# Patient Record
Sex: Male | Born: 1955 | Race: White | Hispanic: No | Smoking: Current every day smoker
Health system: Southern US, Community
[De-identification: ages and names within clinical notes are randomized; demographics above are authoritative.]

## PROBLEM LIST (undated history)

## (undated) DIAGNOSIS — J449 Chronic obstructive pulmonary disease, unspecified: Secondary | ICD-10-CM

## (undated) DIAGNOSIS — I2510 Atherosclerotic heart disease of native coronary artery without angina pectoris: Secondary | ICD-10-CM

## (undated) DIAGNOSIS — F1010 Alcohol abuse, uncomplicated: Secondary | ICD-10-CM

## (undated) DIAGNOSIS — Z720 Tobacco use: Secondary | ICD-10-CM

## (undated) HISTORY — PX: ABDOMINAL SURGERY: SHX537

---

## 2001-03-20 ENCOUNTER — Encounter: Payer: Self-pay | Admitting: Emergency Medicine

## 2001-03-20 ENCOUNTER — Emergency Department (HOSPITAL_COMMUNITY): Admission: EM | Admit: 2001-03-20 | Discharge: 2001-03-20 | Payer: Self-pay | Admitting: Emergency Medicine

## 2003-01-11 ENCOUNTER — Emergency Department (HOSPITAL_COMMUNITY): Admission: EM | Admit: 2003-01-11 | Discharge: 2003-01-11 | Payer: Self-pay | Admitting: Emergency Medicine

## 2003-12-23 ENCOUNTER — Emergency Department (HOSPITAL_COMMUNITY): Admission: EM | Admit: 2003-12-23 | Discharge: 2003-12-23 | Payer: Self-pay | Admitting: Emergency Medicine

## 2005-12-26 ENCOUNTER — Observation Stay (HOSPITAL_COMMUNITY): Admission: EM | Admit: 2005-12-26 | Discharge: 2005-12-27 | Payer: Self-pay | Admitting: Emergency Medicine

## 2006-01-10 ENCOUNTER — Ambulatory Visit: Payer: Self-pay | Admitting: *Deleted

## 2006-01-17 ENCOUNTER — Encounter (HOSPITAL_COMMUNITY): Admission: RE | Admit: 2006-01-17 | Discharge: 2006-02-16 | Payer: Self-pay | Admitting: *Deleted

## 2006-01-17 ENCOUNTER — Ambulatory Visit: Payer: Self-pay | Admitting: *Deleted

## 2006-01-24 ENCOUNTER — Ambulatory Visit: Payer: Self-pay | Admitting: *Deleted

## 2007-04-22 ENCOUNTER — Emergency Department (HOSPITAL_COMMUNITY): Admission: EM | Admit: 2007-04-22 | Discharge: 2007-04-22 | Payer: Self-pay | Admitting: Emergency Medicine

## 2007-04-24 ENCOUNTER — Emergency Department (HOSPITAL_COMMUNITY): Admission: EM | Admit: 2007-04-24 | Discharge: 2007-04-24 | Payer: Self-pay | Admitting: Emergency Medicine

## 2011-04-30 NOTE — Discharge Summary (Signed)
NAME:  Spencer Hickman, BETSCH             ACCOUNT NO.:  [id number]   MEDICAL RECORD NO.:  [id number]          PATIENT TYPE:  INP   LOCATION:  A223                          FACILITY:  [facility]   PHYSICIAN:  Margaretmary Dys, M.D.DATE OF BIRTH:  1956-01-07   DATE OF ADMISSION:  12/26/2005  DATE OF DISCHARGE:  01/15/2007LH                                 DISCHARGE SUMMARY   ADMITTING DIAGNOSES:  1.  Chest pain, rule out myocardial infarction.  2.  Acute alcohol intoxication.  3.  Pneumonia.  4.  Chronic obstructive pulmonary disease.   DISCHARGE MEDICATIONS:  1.  Levaquin 750 mg p.o. once a day for 5 days.  2.  Protonix 40 mg p.o. once a day.   DIET:  Low salt diet.   ACTIVITY:  As tolerated.   SPECIAL INSTRUCTIONS:  The patient was advised to quit smoking and alcohol.   HISTORY OF PRESENT ILLNESS:  Spencer Hickman is a 55 year old Caucasian male who  presented to the emergency room with complaints of chest pain mostly on the  left side of his chest.  This was described as sharp . Kindly review the  history and physical dictated by Dr. Rito Ehrlich on December 26, 2005.   The patient was admitted for chest pain, rule out MI.  Although his pain was  very atypical, it was felt that because of his family history of coronary  artery disease which was fairly significant and he has a smoking history,  close to 100-pack-year history of smoking, it would be reasonable to admit  him and rule him out.  After 12 hours of admission the patient stated that  his pain completely disappeared.  His chest x-ray on admission drew concern  for pneumonia which was treated with Levaquin.  It was felt that the pain  was very atypical and the patient may have outpatient stress test with  [facility].  This was scheduled.   ACTIVITY:  As tolerated.   PERTINENT LABORATORY DATA:  White blood cell count was 6.4, hemoglobin was  13.7, platelets were 177, alcohol level was 303.  BNP was 30, potassium was  3.3,  albumin was 3.2, calcium of 8.1.  Urine drug screen was positive for  benzodiazepines and THC.  Urinalysis was unremarkable.  Chest x-ray shows  focal atelectasis with bronchopneumonia in the right lung base.  EKG shows  sinus rhythm with possible left axis deviation.   DISPOSITION:  The patient is being discharged home.  Counseling was done on  quitting smoking.  Also I offered him options and treatment for quitting  smoking which he declined.  The patient said that he will do it on his own.  It was felt that his chest pain was very atypical; and we will, however,  recommend an outpatient stress test with [facility].  This will be  scheduled prior to his discharge.      Margaretmary Dys, M.D.  Electronically Signed     AM/MEDQ  D:  12/27/2005  T:  12/27/2005  Job:  [id number]

## 2011-04-30 NOTE — Procedures (Signed)
NAME:  Spencer Hickman, Spencer Hickman             ACCOUNT NO.:  [id number]   MEDICAL RECORD NO.:  [id number]          PATIENT TYPE:  REC   LOCATION:  DAY                           FACILITY:  [facility]   PHYSICIAN:  Vida Roller, M.D.   DATE OF BIRTH:  1956-02-23   DATE OF PROCEDURE:  DATE OF DISCHARGE:                                    STRESS TEST   HISTORY:  Spencer Hickman is a 55 year old male with no known coronary disease,  atypical chest discomfort, cardiac risk factors, tobacco abuse and family  history.   RESULTS OF PROCEDURE:  Electrocardiogram reveals sinus rhythm at 68 beats  per minute, nonspecific ST abnormalities.  Poor R-wave progression.  Blood  pressure is 138/72.   The patient exercised for a total of 12 minutes and 50 seconds, 14.8 mets,  maximal heart rate was 173 which is 102% of predicted, maximum blood  pressure is 198/88 and resolved down to 150/80 in recovery.  EKG revealed no  ischemic changes.  No arrhythmias were noted.   Final images and results are pending MD review.      Jae Dire, P.A. [facility]      Vida Roller, M.D.  Electronically Signed    AB/MEDQ  D:  01/17/2006  T:  01/17/2006  Job:  [id number]
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
## 2019-02-04 ENCOUNTER — Emergency Department (HOSPITAL_COMMUNITY): Payer: Medicare Other

## 2019-02-04 ENCOUNTER — Other Ambulatory Visit: Payer: Self-pay

## 2019-02-04 ENCOUNTER — Inpatient Hospital Stay (HOSPITAL_COMMUNITY)
Admission: EM | Admit: 2019-02-04 | Discharge: 2019-02-06 | DRG: 812 | Discharge status: Against Medical Advice | Payer: Medicare Other | Attending: Family Medicine | Admitting: Family Medicine

## 2019-02-04 ENCOUNTER — Encounter (HOSPITAL_COMMUNITY): Payer: Self-pay

## 2019-02-04 DIAGNOSIS — F10229 Alcohol dependence with intoxication, unspecified: Secondary | ICD-10-CM | POA: Diagnosis present

## 2019-02-04 DIAGNOSIS — Z681 Body mass index (BMI) 19 or less, adult: Secondary | ICD-10-CM

## 2019-02-04 DIAGNOSIS — R55 Syncope and collapse: Secondary | ICD-10-CM

## 2019-02-04 DIAGNOSIS — F17210 Nicotine dependence, cigarettes, uncomplicated: Secondary | ICD-10-CM | POA: Diagnosis present

## 2019-02-04 DIAGNOSIS — D509 Iron deficiency anemia, unspecified: Secondary | ICD-10-CM | POA: Diagnosis present

## 2019-02-04 DIAGNOSIS — R160 Hepatomegaly, not elsewhere classified: Secondary | ICD-10-CM

## 2019-02-04 DIAGNOSIS — D508 Other iron deficiency anemias: Principal | ICD-10-CM | POA: Diagnosis present

## 2019-02-04 DIAGNOSIS — E46 Unspecified protein-calorie malnutrition: Secondary | ICD-10-CM | POA: Diagnosis present

## 2019-02-04 DIAGNOSIS — F1010 Alcohol abuse, uncomplicated: Secondary | ICD-10-CM | POA: Diagnosis present

## 2019-02-04 DIAGNOSIS — R569 Unspecified convulsions: Secondary | ICD-10-CM

## 2019-02-04 DIAGNOSIS — I2510 Atherosclerotic heart disease of native coronary artery without angina pectoris: Secondary | ICD-10-CM | POA: Diagnosis present

## 2019-02-04 DIAGNOSIS — J441 Chronic obstructive pulmonary disease with (acute) exacerbation: Secondary | ICD-10-CM | POA: Diagnosis present

## 2019-02-04 DIAGNOSIS — J449 Chronic obstructive pulmonary disease, unspecified: Secondary | ICD-10-CM | POA: Diagnosis present

## 2019-02-04 DIAGNOSIS — Z9119 Patient's noncompliance with other medical treatment and regimen: Secondary | ICD-10-CM

## 2019-02-04 DIAGNOSIS — Z720 Tobacco use: Secondary | ICD-10-CM | POA: Diagnosis present

## 2019-02-04 DIAGNOSIS — F10129 Alcohol abuse with intoxication, unspecified: Secondary | ICD-10-CM | POA: Diagnosis present

## 2019-02-04 DIAGNOSIS — D649 Anemia, unspecified: Secondary | ICD-10-CM | POA: Diagnosis present

## 2019-02-04 DIAGNOSIS — R079 Chest pain, unspecified: Secondary | ICD-10-CM | POA: Diagnosis present

## 2019-02-04 DIAGNOSIS — Y908 Blood alcohol level of 240 mg/100 ml or more: Secondary | ICD-10-CM | POA: Diagnosis present

## 2019-02-04 DIAGNOSIS — I252 Old myocardial infarction: Secondary | ICD-10-CM

## 2019-02-04 DIAGNOSIS — E8809 Other disorders of plasma-protein metabolism, not elsewhere classified: Secondary | ICD-10-CM | POA: Diagnosis present

## 2019-02-04 DIAGNOSIS — R0609 Other forms of dyspnea: Secondary | ICD-10-CM

## 2019-02-04 HISTORY — DX: Chronic obstructive pulmonary disease, unspecified: J44.9

## 2019-02-04 HISTORY — DX: Tobacco use: Z72.0

## 2019-02-04 HISTORY — DX: Atherosclerotic heart disease of native coronary artery without angina pectoris: I25.10

## 2019-02-04 HISTORY — DX: Alcohol abuse, uncomplicated: F10.10

## 2019-02-04 MED ORDER — ALBUTEROL SULFATE (2.5 MG/3ML) 0.083% IN NEBU
5.0000 mg | INHALATION_SOLUTION | Freq: Once | RESPIRATORY_TRACT | Status: AC
  Administered 2019-02-04: 5 mg via RESPIRATORY_TRACT
  Filled 2019-02-04: qty 6

## 2019-02-04 NOTE — Progress Notes (Signed)
Patient took about half of treatment stated he couldn't breath . His oxygen drops very quickly, Keeps  asking for cigarette. Breath sounds decreased.

## 2019-02-04 NOTE — ED Triage Notes (Addendum)
Pt reports generalized chest pain x 3 months. The past couple of days pt has increased SOB with normal activity. Pt reports the past 1-2 months he has been having "syncopal episodes".  Pt reports hx of COPD but says he can't afford medication. Pt drinks about 6-12 beers daily per report and smokes 1- 1/2 pack of cigarettes daily.

## 2019-02-05 ENCOUNTER — Observation Stay (HOSPITAL_BASED_OUTPATIENT_CLINIC_OR_DEPARTMENT_OTHER): Payer: Medicare Other

## 2019-02-05 ENCOUNTER — Encounter (HOSPITAL_COMMUNITY): Payer: Self-pay | Admitting: Internal Medicine

## 2019-02-05 ENCOUNTER — Observation Stay (HOSPITAL_COMMUNITY): Payer: Medicare Other

## 2019-02-05 DIAGNOSIS — J449 Chronic obstructive pulmonary disease, unspecified: Secondary | ICD-10-CM | POA: Diagnosis present

## 2019-02-05 DIAGNOSIS — J441 Chronic obstructive pulmonary disease with (acute) exacerbation: Secondary | ICD-10-CM | POA: Diagnosis present

## 2019-02-05 DIAGNOSIS — R55 Syncope and collapse: Secondary | ICD-10-CM | POA: Diagnosis not present

## 2019-02-05 DIAGNOSIS — F10129 Alcohol abuse with intoxication, unspecified: Secondary | ICD-10-CM | POA: Diagnosis present

## 2019-02-05 DIAGNOSIS — F1010 Alcohol abuse, uncomplicated: Secondary | ICD-10-CM | POA: Diagnosis present

## 2019-02-05 DIAGNOSIS — E8809 Other disorders of plasma-protein metabolism, not elsewhere classified: Secondary | ICD-10-CM | POA: Diagnosis present

## 2019-02-05 DIAGNOSIS — D509 Iron deficiency anemia, unspecified: Secondary | ICD-10-CM | POA: Diagnosis present

## 2019-02-05 DIAGNOSIS — Z720 Tobacco use: Secondary | ICD-10-CM

## 2019-02-05 DIAGNOSIS — R079 Chest pain, unspecified: Secondary | ICD-10-CM | POA: Diagnosis present

## 2019-02-05 HISTORY — DX: Tobacco use: Z72.0

## 2019-02-05 LAB — CBC
HCT: 26 % — ABNORMAL LOW (ref 39.0–52.0)
Hemoglobin: 7.5 g/dL — ABNORMAL LOW (ref 13.0–17.0)
MCH: 23.7 pg — ABNORMAL LOW (ref 26.0–34.0)
MCHC: 28.8 g/dL — ABNORMAL LOW (ref 30.0–36.0)
MCV: 82.3 fL (ref 80.0–100.0)
PLATELETS: 313 10*3/uL (ref 150–400)
RBC: 3.16 MIL/uL — ABNORMAL LOW (ref 4.22–5.81)
RDW: 23.4 % — ABNORMAL HIGH (ref 11.5–15.5)
WBC: 3.9 10*3/uL — AB (ref 4.0–10.5)
nRBC: 0 % (ref 0.0–0.2)

## 2019-02-05 LAB — IRON AND TIBC
IRON: 7 ug/dL — AB (ref 45–182)
Saturation Ratios: 2 % — ABNORMAL LOW (ref 17.9–39.5)
TIBC: 329 ug/dL (ref 250–450)
UIBC: 322 ug/dL

## 2019-02-05 LAB — URINALYSIS, ROUTINE W REFLEX MICROSCOPIC
Bilirubin Urine: NEGATIVE
GLUCOSE, UA: NEGATIVE mg/dL
HGB URINE DIPSTICK: NEGATIVE
Ketones, ur: NEGATIVE mg/dL
Leukocytes,Ua: NEGATIVE
Nitrite: NEGATIVE
Protein, ur: NEGATIVE mg/dL
Specific Gravity, Urine: 1.005 (ref 1.005–1.030)
pH: 5 (ref 5.0–8.0)

## 2019-02-05 LAB — COMPREHENSIVE METABOLIC PANEL
ALK PHOS: 109 U/L (ref 38–126)
ALT: 25 U/L (ref 0–44)
ANION GAP: 14 (ref 5–15)
AST: 63 U/L — ABNORMAL HIGH (ref 15–41)
Albumin: 2.6 g/dL — ABNORMAL LOW (ref 3.5–5.0)
BUN: 8 mg/dL (ref 8–23)
CALCIUM: 8.1 mg/dL — AB (ref 8.9–10.3)
CO2: 20 mmol/L — ABNORMAL LOW (ref 22–32)
CREATININE: 0.73 mg/dL (ref 0.61–1.24)
Chloride: 101 mmol/L (ref 98–111)
Glucose, Bld: 99 mg/dL (ref 70–99)
Potassium: 3.9 mmol/L (ref 3.5–5.1)
Sodium: 135 mmol/L (ref 135–145)
Total Bilirubin: 0.2 mg/dL — ABNORMAL LOW (ref 0.3–1.2)
Total Protein: 7.4 g/dL (ref 6.5–8.1)

## 2019-02-05 LAB — CBC WITH DIFFERENTIAL/PLATELET
Abs Immature Granulocytes: 0.02 10*3/uL (ref 0.00–0.07)
BASOS PCT: 1 %
Basophils Absolute: 0.1 10*3/uL (ref 0.0–0.1)
EOS ABS: 0.1 10*3/uL (ref 0.0–0.5)
EOS PCT: 2 %
HCT: 27.1 % — ABNORMAL LOW (ref 39.0–52.0)
Hemoglobin: 7.8 g/dL — ABNORMAL LOW (ref 13.0–17.0)
Immature Granulocytes: 0 %
Lymphocytes Relative: 34 %
Lymphs Abs: 1.9 10*3/uL (ref 0.7–4.0)
MCH: 22.9 pg — ABNORMAL LOW (ref 26.0–34.0)
MCHC: 28.8 g/dL — AB (ref 30.0–36.0)
MCV: 79.5 fL — ABNORMAL LOW (ref 80.0–100.0)
Monocytes Absolute: 0.4 10*3/uL (ref 0.1–1.0)
Monocytes Relative: 7 %
NRBC: 0 % (ref 0.0–0.2)
Neutro Abs: 3.2 10*3/uL (ref 1.7–7.7)
Neutrophils Relative %: 56 %
PLATELETS: 332 10*3/uL (ref 150–400)
RBC: 3.41 MIL/uL — AB (ref 4.22–5.81)
RDW: 23.4 % — AB (ref 11.5–15.5)
WBC: 5.6 10*3/uL (ref 4.0–10.5)

## 2019-02-05 LAB — PHOSPHORUS: Phosphorus: 3.8 mg/dL (ref 2.5–4.6)

## 2019-02-05 LAB — PROTIME-INR
INR: 1.04
PROTHROMBIN TIME: 13.5 s (ref 11.4–15.2)

## 2019-02-05 LAB — CBG MONITORING, ED: Glucose-Capillary: 143 mg/dL — ABNORMAL HIGH (ref 70–99)

## 2019-02-05 LAB — ECHOCARDIOGRAM COMPLETE
Height: 71 in
Weight: 1908.3 oz

## 2019-02-05 LAB — OCCULT BLOOD, POC DEVICE: Fecal Occult Bld: NEGATIVE

## 2019-02-05 LAB — ETHANOL: ALCOHOL ETHYL (B): 332 mg/dL — AB (ref <10)

## 2019-02-05 LAB — TROPONIN I: Troponin I: <0.03 ng/mL (ref <0.03)

## 2019-02-05 LAB — RETICULOCYTES
Immature Retic Fract: 25.3 % — ABNORMAL HIGH (ref 2.3–15.9)
RBC.: 3.28 MIL/uL — ABNORMAL LOW (ref 4.22–5.81)
RETIC COUNT ABSOLUTE: 47.2 10*3/uL (ref 19.0–186.0)
RETIC CT PCT: 1.4 % (ref 0.4–3.1)

## 2019-02-05 LAB — MRSA PCR SCREENING: MRSA by PCR: NEGATIVE

## 2019-02-05 LAB — FERRITIN: FERRITIN: 9 ng/mL — AB (ref 24–336)

## 2019-02-05 LAB — FOLATE: Folate: 10.2 ng/mL (ref >5.9)

## 2019-02-05 LAB — VITAMIN B12: Vitamin B-12: 245 pg/mL (ref 180–914)

## 2019-02-05 LAB — MAGNESIUM: Magnesium: 2 mg/dL (ref 1.7–2.4)

## 2019-02-05 LAB — BRAIN NATRIURETIC PEPTIDE: B Natriuretic Peptide: 64 pg/mL (ref 0.0–100.0)

## 2019-02-05 MED ORDER — LORAZEPAM 2 MG/ML IJ SOLN: 2.0000 mg | INTRAMUSCULAR | Status: DC | PRN

## 2019-02-05 MED ORDER — IPRATROPIUM-ALBUTEROL 0.5-2.5 (3) MG/3ML IN SOLN
3.0000 mL | RESPIRATORY_TRACT | Status: DC | PRN
  Filled 2019-02-05: qty 3

## 2019-02-05 MED ORDER — KCL IN DEXTROSE-NACL 20-5-0.9 MEQ/L-%-% IV SOLN: INTRAVENOUS | Status: DC

## 2019-02-05 MED ORDER — GUAIFENESIN ER 600 MG PO TB12
600.0000 mg | ORAL_TABLET | Freq: Two times a day (BID) | ORAL | Status: DC
  Administered 2019-02-05 – 2019-02-06 (×3): 600 mg via ORAL
  Filled 2019-02-05 (×4): qty 1

## 2019-02-05 MED ORDER — METHYLPREDNISOLONE SODIUM SUCC 40 MG IJ SOLR
40.0000 mg | Freq: Once | INTRAMUSCULAR | Status: AC
  Administered 2019-02-05: 40 mg via INTRAVENOUS
  Filled 2019-02-05: qty 1

## 2019-02-05 MED ORDER — MAGNESIUM SULFATE 2 GM/50ML IV SOLN
2.0000 g | Freq: Once | INTRAVENOUS | Status: AC
  Administered 2019-02-05: 2 g via INTRAVENOUS
  Filled 2019-02-05: qty 50

## 2019-02-05 MED ORDER — SODIUM CHLORIDE 0.9 % IV BOLUS: 1000.0000 mL | Freq: Once | INTRAVENOUS | Status: DC

## 2019-02-05 MED ORDER — SODIUM CHLORIDE 0.9 % IV BOLUS
1000.0000 mL | Freq: Once | INTRAVENOUS | Status: AC
  Administered 2019-02-05: 1000 mL via INTRAVENOUS

## 2019-02-05 MED ORDER — DIAZEPAM 5 MG PO TABS
5.0000 mg | ORAL_TABLET | Freq: Once | ORAL | Status: AC
  Administered 2019-02-05: 5 mg via ORAL
  Filled 2019-02-05: qty 1

## 2019-02-05 MED ORDER — NICOTINE 21 MG/24HR TD PT24
21.0000 mg | MEDICATED_PATCH | Freq: Once | TRANSDERMAL | Status: AC
  Administered 2019-02-05: 21 mg via TRANSDERMAL
  Filled 2019-02-05: qty 1

## 2019-02-05 MED ORDER — METOPROLOL TARTRATE 25 MG PO TABS
25.0000 mg | ORAL_TABLET | Freq: Two times a day (BID) | ORAL | Status: DC
  Administered 2019-02-05 – 2019-02-06 (×3): 25 mg via ORAL
  Filled 2019-02-05 (×3): qty 1

## 2019-02-05 MED ORDER — NICOTINE 21 MG/24HR TD PT24
21.0000 mg | MEDICATED_PATCH | TRANSDERMAL | Status: DC
  Administered 2019-02-06: 21 mg via TRANSDERMAL
  Filled 2019-02-05: qty 1

## 2019-02-05 MED ORDER — DOXYCYCLINE HYCLATE 100 MG PO TABS
100.0000 mg | ORAL_TABLET | Freq: Two times a day (BID) | ORAL | Status: DC
  Administered 2019-02-05 – 2019-02-06 (×3): 100 mg via ORAL
  Filled 2019-02-05 (×3): qty 1

## 2019-02-05 MED ORDER — ONDANSETRON HCL 4 MG/2ML IJ SOLN: 4.0000 mg | Freq: Four times a day (QID) | INTRAMUSCULAR | Status: DC | PRN

## 2019-02-05 MED ORDER — PANTOPRAZOLE SODIUM 40 MG IV SOLR
40.0000 mg | INTRAVENOUS | Status: DC
  Administered 2019-02-05: 40 mg via INTRAVENOUS
  Filled 2019-02-05: qty 40

## 2019-02-05 MED ORDER — THIAMINE HCL 100 MG/ML IJ SOLN
INTRAVENOUS | Status: AC
  Filled 2019-02-05: qty 1000

## 2019-02-05 MED ORDER — NICOTINE 21 MG/24HR TD PT24: 21.0000 mg | MEDICATED_PATCH | TRANSDERMAL | Status: DC

## 2019-02-05 MED ORDER — IPRATROPIUM-ALBUTEROL 0.5-2.5 (3) MG/3ML IN SOLN
3.0000 mL | Freq: Three times a day (TID) | RESPIRATORY_TRACT | Status: DC
  Administered 2019-02-05 – 2019-02-06 (×2): 3 mL via RESPIRATORY_TRACT
  Filled 2019-02-05 (×2): qty 3

## 2019-02-05 MED ORDER — THIAMINE HCL 100 MG/ML IJ SOLN: Freq: Once | INTRAVENOUS | Status: DC

## 2019-02-05 MED ORDER — SODIUM CHLORIDE 0.9% FLUSH
3.0000 mL | Freq: Two times a day (BID) | INTRAVENOUS | Status: DC
  Administered 2019-02-05 – 2019-02-06 (×2): 3 mL via INTRAVENOUS

## 2019-02-05 MED ORDER — THIAMINE HCL 100 MG/ML IJ SOLN
100.0000 mg | Freq: Every day | INTRAMUSCULAR | Status: DC
  Administered 2019-02-05 – 2019-02-06 (×2): 100 mg via INTRAVENOUS
  Filled 2019-02-05 (×2): qty 2

## 2019-02-05 MED ORDER — PANTOPRAZOLE SODIUM 40 MG PO TBEC
40.0000 mg | DELAYED_RELEASE_TABLET | Freq: Every day | ORAL | Status: DC
  Filled 2019-02-05: qty 1

## 2019-02-05 MED ORDER — ATORVASTATIN CALCIUM 10 MG PO TABS
10.0000 mg | ORAL_TABLET | Freq: Every day | ORAL | Status: DC
  Administered 2019-02-05: 10 mg via ORAL
  Filled 2019-02-05: qty 1

## 2019-02-05 MED ORDER — PREDNISONE 20 MG PO TABS
40.0000 mg | ORAL_TABLET | Freq: Every day | ORAL | Status: DC
  Administered 2019-02-05 – 2019-02-06 (×2): 40 mg via ORAL
  Filled 2019-02-05 (×2): qty 2

## 2019-02-05 MED ORDER — THIAMINE HCL 100 MG/ML IJ SOLN
INTRAVENOUS | Status: AC
  Administered 2019-02-05: 16:00:00 via INTRAVENOUS
  Filled 2019-02-05: qty 1000

## 2019-02-05 NOTE — ED Notes (Signed)
hospitalist at bedside

## 2019-02-05 NOTE — ED Notes (Signed)
Spoke with patient and family about the need and importance of pt staying as he has some abnormal lab work. Offered pt a nicotine patch. Pt accepted. Pt and family agreed at this time. Dr Lynelle Doctor aware.

## 2019-02-05 NOTE — ED Notes (Signed)
Family at bedside. 
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
## 2019-02-05 NOTE — H&P (Signed)
History and Physical    Spencer Hickman:[id number] DOB: 06-18-1956 DOA: 02/04/2019  PCP: Patient, No Pcp Per   Patient coming from: Home.  I have personally briefly reviewed patient's old medical records in [place]  Chief Complaint: Syncopal episodes  HPI: Spencer Hickman is a 63 y.o. male with medical history significant of alcohol abuse, tobacco abuse, COPD, CAD, history of MI in 2007, noncompliance with medical treatment or follow-ups (has not seen cardiology since having MI) who is coming to the emergency department with multiple complaints, including feeling progressively worse dyspnea associated with nonproductive coughing and wheezing for the past 2 days, but states that for the past 2 to 3 months he has not felt well.  He has been lightheaded often.  He has passed out 2 or 3 times, most recently this past week, he has had several near syncopal episodes as well.  He complains of on and off chest pain while exerting, but denies nausea, emesis or diaphoresis.  He denies orthopnea or lower extremity edema.  He denies abdominal pain, nausea, emesis or hematemesis, diarrhea or constipation, melena or hematochezia.  He denies dysuria, frequency or hematuria.  He denies polyuria, polydipsia, polyphagia or blurred vision.  ED Course: Initial vital signs temperature 98.2 F, pulse 96, respirations 21, blood pressure 154/99 mmHg and O2 sat 84% on room air.  Patient received supplemental oxygen and a 5 mg albuterol continuous nebulizer treatment.  I ordered magnesium sulfate 2 g IVPB, Solu-Medrol 40 mg IVP and at 1000 mL NS bolus.  His white count was 5.6 with a normal differential, hemoglobin 7.8 g/dL with an MCV of 79.54 fL and platelets 332.  PT/INR within normal limits troponin level was normal.  EKG was sinus tachycardia with LAD, questionable LAFB.  There was baseline wandering electrode noise.  There were no previous EKGs to compare to. BNP was 64.0 pg/mL.  CMP shows CO2 of 20  mmol/L and calcium of 8.1 mg/dL.  All other electrolytes are normal.  Renal function within expected values.  Total protein was 7.4, but albumin was low at 2.6 g/dL.  AST was 63 units/L.  ALT and alk phos were normal.  Total bilirubin is low at 0.2 and alcohol is 332 mg/dL.  Imaging: CT head showed chronic atrophy and small vessel ischemic changes.  There were chronic inflammatory changes in the right maxillary antrum.  However, there were no acute intracranial abnormalities.  His chest radiograph shows emphysematous and chronic bronchitic changes in the lungs.  There is no evidence of active pulmonary disease though.  Please see images and full radiology report for further detail.  Review of Systems: As per HPI otherwise 10 point review of systems negative.   Past Medical History:  Diagnosis Date  . Alcohol abuse   . COPD (chronic obstructive pulmonary disease) ([place])   . Coronary artery disease    States "had a mild heart attack years ago.  . Tobacco abuse 02/05/2019   Family History  Problem Relation Age of Onset  . Cancer Mother   . Brain cancer Father   . Cancer Sister   . Cancer Brother    Past Surgical History:  Procedure Laterality Date  . ABDOMINAL SURGERY     from [place] asva child     reports that he has been smoking cigarettes. He has a 50.00 pack-year smoking history. He has never used smokeless tobacco. He reports current alcohol use. He reports current drug use. Drug: Marijuana.  No Known Allergies  Family History  Problem Relation Age of Onset  . Cancer Mother   . Brain cancer Father   . Cancer Sister   . Cancer Brother    Prior to Admission medications   Not on File    Physical Exam: Vitals:   02/05/19 0130 02/05/19 0200 02/05/19 0230 02/05/19 0300  BP: 110/66 112/72 107/72 126/74  Pulse: 92 (!) 108 94 (!) 105  Resp: 14 20 (!) 21 19  Temp:      TempSrc:      SpO2: 99% 100% 100% 98%  Weight:      Height:        Constitutional: Looks older than  chronological age.  NAD, calm, comfortable Eyes: PERRL, lids and conjunctivae look pale. ENMT: Mucous membranes are moist. Posterior pharynx clear of any exudate or lesions. Neck: normal, supple, no masses, no thyromegaly Respiratory: Mild bilateral rhonchi and wheezing, no crackles. Normal respiratory effort. No accessory muscle use.  Cardiovascular: Tachycardic at 106 bpm, no murmurs / rubs / gallops. No extremity edema. 2+ pedal pulses. No carotid bruits.  Abdomen: Soft, mild epigastric tenderness, no guarding, no masses palpated. No hepatosplenomegaly. Bowel sounds positive.  Musculoskeletal: no clubbing / cyanosis. Good ROM, no contractures. Normal muscle tone.  Skin: no rashes, lesions, ulcers on limited dermatological examination. Neurologic: CN 2-12 grossly intact. Sensation intact, DTR normal. Strength 5/5 in all 4.  Psychiatric: Normal judgment and insight. Alert and oriented x 3. Normal mood.   Labs on Admission: I have personally reviewed following labs and imaging studies  CBC: Recent Labs  Lab 02/04/19 2358  WBC 5.6  NEUTROABS 3.2  HGB 7.8*  HCT 27.1*  MCV 79.5*  PLT 762   Basic Metabolic Panel: Recent Labs  Lab 02/04/19 2358  NA 135  K 3.9  CL 101  CO2 20*  GLUCOSE 99  BUN 8  CREATININE 0.73  CALCIUM 8.1*   GFR: Estimated Creatinine Clearance: 85.7 mL/min (by C-G formula based on SCr of 0.73 mg/dL). Liver Function Tests: Recent Labs  Lab 02/04/19 2358  AST 63*  ALT 25  ALKPHOS 109  BILITOT 0.2*  PROT 7.4  ALBUMIN 2.6*   No results for input(s): LIPASE, AMYLASE in the last 168 hours. No results for input(s): AMMONIA in the last 168 hours. Coagulation Profile: No results for input(s): INR, PROTIME in the last 168 hours. Cardiac Enzymes: Recent Labs  Lab 02/04/19 2358  TROPONINI <0.03   BNP (last 3 results) No results for input(s): PROBNP in the last 8760 hours. HbA1C: No results for input(s): HGBA1C in the last 72 hours. CBG: No results  for input(s): GLUCAP in the last 168 hours. Lipid Profile: No results for input(s): CHOL, HDL, LDLCALC, TRIG, CHOLHDL, LDLDIRECT in the last 72 hours. Thyroid Function Tests: No results for input(s): TSH, T4TOTAL, FREET4, T3FREE, THYROIDAB in the last 72 hours. Anemia Panel: No results for input(s): VITAMINB12, FOLATE, FERRITIN, TIBC, IRON, RETICCTPCT in the last 72 hours. Urine analysis: No results found for: COLORURINE, APPEARANCEUR, LABSPEC, PHURINE, GLUCOSEU, HGBUR, BILIRUBINUR, KETONESUR, PROTEINUR, UROBILINOGEN, NITRITE, LEUKOCYTESUR  Radiological Exams on Admission: Dg Chest 2 View  Result Date: 02/05/2019 CLINICAL DATA:  Generalized chest pain for 3 months. Increasing shortness of breath with normal activity. Syncopal episodes. History of COPD. Current smoker. EXAM: CHEST - 2 VIEW COMPARISON:  07/19/2017 FINDINGS: Normal heart size and pulmonary vascularity. Emphysematous changes in the lungs. Scattered central fibrosis suggesting chronic bronchitis. No focal consolidation. No blunting of costophrenic angles. No pneumothorax. Mediastinal contours appear intact.  Degenerative changes in the spine and shoulders. IMPRESSION: Emphysematous and chronic bronchitic changes in the lungs. No evidence of active pulmonary disease. Electronically Signed   By: Lucienne Capers M.D.   On: 02/05/2019 00:28   Ct Head Wo Contrast  Result Date: 02/05/2019 CLINICAL DATA:  Generalized chest pain for 3 months. Increasing shortness of breath with normal activity. Syncopal episodes. EXAM: CT HEAD WITHOUT CONTRAST TECHNIQUE: Contiguous axial images were obtained from the base of the skull through the vertex without intravenous contrast. COMPARISON:  06/09/2008 FINDINGS: Brain: Diffuse cerebral atrophy. Mild ventricular dilatation consistent with central atrophy. Low-attenuation changes in the deep white matter consistent small vessel ischemia. No mass-effect or midline shift. No abnormal extra-axial fluid  collections. Gray-white matter junctions are distinct. Basal cisterns are not effaced. No acute intracranial hemorrhage. Vascular: Mild intracranial arterial vascular calcifications are present. Skull: Calvarium appears intact. No acute depressed skull fractures. Sinuses/Orbits: Mucosal thickening in the right maxillary antrum. Paranasal sinuses and mastoid air cells are otherwise clear. Other: None. IMPRESSION: No acute intracranial abnormalities. Chronic atrophy and small vessel ischemic changes. Chronic inflammatory changes in the right maxillary antrum. Electronically Signed   By: Lucienne Capers M.D.   On: 02/05/2019 00:26    EKG: Independently reviewed.  Vent. rate 100 BPM PR interval * ms QRS duration 112 ms QT/QTc 352/454 ms P-R-T axes 77 -61 72 Sinus tachycardia LAD, consider left anterior fascicular block Baseline wander Electrode noise No previous EKGs.  Assessment/Plan Principal Problem:   Syncopal episodes Has passed out about 3 times. Has had multiple near syncopal events as well. Most likely this is due to microcytic anemia. Continue IV fluids. Monitor H&H. Check echocardiogram. Check carotid Doppler.  Active Problems:   Chest pain Trend troponin level. Serial EKGs. Check echocardiogram. Smoking cessation advised. Should follow-up with cardiology as an outpatient. Should also establish with a PCP.    Alcohol abuse with intoxication ([place]) Continue IV hydration. Magnesium was supplemented. Thiamine, folic acid and MVI. No signs of intoxication at this time CIWA protocol preemptively ordered. Advised against alcohol consumption.    Tobacco abuse Nicotine replacement therapy ordered.    Microcytic anemia Check anemia profile. Check stool occult blood. Monitor H&H. The patient agrees to a transfusion if needed. Consult GI if H&H decreases or positive guaiac stool.    COPD with acute exacerbation ([place]) Smoking cessation advised. Continue supplemental  oxygen. Continue schedule and as needed bronchodilators.    Hypoalbuminemia Secondary to malnutrition due to alcoholism. Needs alcohol cessation. Consider nutritional services evaluation.    DVT prophylaxis: SCDs. Code Status: Full code. Family Communication: Disposition Plan: Observation for syncope/CP work-up, troponin and H&H monitoring. Consults called: Routine gastroenterology consult. Admission status: Observation/stepdown.   Reubin Milan MD [facility]  02/05/2019, 3:33 AM

## 2019-02-05 NOTE — Progress Notes (Signed)
Patient seen and evaluated, chart reviewed, please see EMR for updated orders. Please see full H&P dictated by admitting physician Dr Robb Matar for same date of service.    Brief Summary:-   63 y.o. male with medical history significant of alcohol abuse, tobacco abuse, COPD, CAD, history of MI in 2007, noncompliance with medical treatment or follow-ups (has not seen cardiology since having MI) who admitted on 02/05/2019 with dyspnea with exertion ,  coughing and wheezing.. Symptoms have persisted for couple months however over the last couple of days cough and wheezing is gotten worse.  Also reports episodes of dizziness and fainting----BAL 332 (patient drinks over 12 beers each day), patient has not gone more than a couple days without drinking etoh in more than 20 years. He has passed out 2 or 3 times, most recently this past week, he has had several near syncopal episodes as well.  He complains of on and off chest pain while exerting, but denies nausea, emesis or diaphoresis.     Plan 1)Recurrent episodes of Near Syncope and syncope and  chest pain-----suspect due to significant alcoholic intoxication compounded by anemia----echo and carotid Dopplers pending, EKG without ACS pattern,.  Serial troponin negative, CT head without acute findings... Patient not interested in stress test or coronary revascularization approach, he is notoriously noncompliant, had cardiac work-up in 2007 and has not been back for cardiovascular evaluation since then, please see negative nuclear stress test from 01/19/2006  2)Heavy Alcohol Abuse------ vitamins and lorazepam per CIWA protocol, not interested in alcohol rehab or detox  3)Heavy Tobacco Abuse----patient smokes more than 1-1/2 packs/day..... Not interested in smoking cessation, may use nicotine patch while in hospital  4)Iron deficiency Anemia--- etiology unclear, on my rectal exam patient has brown stool that is Hemoccult negative at bedside--- no recent endoluminal  evaluation, will benefit from EGD and colonoscopy as outpatient unless she has significant drop in H&H in which case may need inpatient evaluation, check serial H&H, globin currently around 7.5 with hematocrit of 26 please note that MCV is 82 with MCH of 23.7 most likely due to concomitant "Relative" B12/folate deficiency in the patient with very heavy EtOH use (  serum folate is 10.2 (WNL), B12 is 245 (low Normal), ferritin is 9 (very low), serum iron is 7 with iron saturation of 2 (both very Low)  5)Recurrent falls/generalized weakness and debility----await physical therapy evaluation, check orthostatic vitals  6) acute COPD exacerbation-----treat empirically with prednisone, mucolytics, bronchodilators and doxycycline,, no significant hypoxia at this time   Patient seen and evaluated, chart reviewed, please see EMR for updated orders. Please see full H&P dictated by admitting physician Dr Robb Matar for same date of service

## 2019-02-05 NOTE — ED Notes (Signed)
Got ok from nurse to give patient food.

## 2019-02-05 NOTE — Progress Notes (Signed)
*  PRELIMINARY RESULTS* Echocardiogram 2D Echocardiogram has been performed.  Stacey Drain 02/05/2019, 2:50 PM

## 2019-02-05 NOTE — ED Notes (Signed)
Pt stating he wants to go home. Dr Lynelle Doctor made aware.

## 2019-02-06 ENCOUNTER — Inpatient Hospital Stay (HOSPITAL_COMMUNITY): Payer: Medicare Other

## 2019-02-06 ENCOUNTER — Encounter (HOSPITAL_COMMUNITY): Payer: Self-pay | Admitting: Gastroenterology

## 2019-02-06 DIAGNOSIS — D649 Anemia, unspecified: Secondary | ICD-10-CM | POA: Diagnosis not present

## 2019-02-06 DIAGNOSIS — F17210 Nicotine dependence, cigarettes, uncomplicated: Secondary | ICD-10-CM

## 2019-02-06 DIAGNOSIS — F10129 Alcohol abuse with intoxication, unspecified: Secondary | ICD-10-CM | POA: Diagnosis not present

## 2019-02-06 DIAGNOSIS — R569 Unspecified convulsions: Secondary | ICD-10-CM | POA: Diagnosis present

## 2019-02-06 DIAGNOSIS — F10229 Alcohol dependence with intoxication, unspecified: Secondary | ICD-10-CM | POA: Diagnosis present

## 2019-02-06 DIAGNOSIS — R079 Chest pain, unspecified: Secondary | ICD-10-CM

## 2019-02-06 DIAGNOSIS — J441 Chronic obstructive pulmonary disease with (acute) exacerbation: Secondary | ICD-10-CM

## 2019-02-06 DIAGNOSIS — Z681 Body mass index (BMI) 19 or less, adult: Secondary | ICD-10-CM | POA: Diagnosis not present

## 2019-02-06 DIAGNOSIS — R55 Syncope and collapse: Secondary | ICD-10-CM | POA: Diagnosis not present

## 2019-02-06 DIAGNOSIS — I2510 Atherosclerotic heart disease of native coronary artery without angina pectoris: Secondary | ICD-10-CM | POA: Diagnosis present

## 2019-02-06 DIAGNOSIS — Z9119 Patient's noncompliance with other medical treatment and regimen: Secondary | ICD-10-CM | POA: Diagnosis not present

## 2019-02-06 DIAGNOSIS — D508 Other iron deficiency anemias: Secondary | ICD-10-CM | POA: Diagnosis present

## 2019-02-06 DIAGNOSIS — F1010 Alcohol abuse, uncomplicated: Secondary | ICD-10-CM | POA: Diagnosis present

## 2019-02-06 DIAGNOSIS — I252 Old myocardial infarction: Secondary | ICD-10-CM | POA: Diagnosis not present

## 2019-02-06 DIAGNOSIS — Y908 Blood alcohol level of 240 mg/100 ml or more: Secondary | ICD-10-CM | POA: Diagnosis present

## 2019-02-06 DIAGNOSIS — E46 Unspecified protein-calorie malnutrition: Secondary | ICD-10-CM | POA: Diagnosis present

## 2019-02-06 DIAGNOSIS — Z720 Tobacco use: Secondary | ICD-10-CM | POA: Diagnosis not present

## 2019-02-06 LAB — HIV ANTIBODY (ROUTINE TESTING W REFLEX): HIV Screen 4th Generation wRfx: NONREACTIVE

## 2019-02-06 LAB — GLUCOSE, CAPILLARY: GLUCOSE-CAPILLARY: 126 mg/dL — AB (ref 70–99)

## 2019-02-06 LAB — CBC
HCT: 22.2 % — ABNORMAL LOW (ref 39.0–52.0)
Hemoglobin: 6.4 g/dL — CL (ref 13.0–17.0)
MCH: 22.9 pg — ABNORMAL LOW (ref 26.0–34.0)
MCHC: 28.8 g/dL — ABNORMAL LOW (ref 30.0–36.0)
MCV: 79.6 fL — ABNORMAL LOW (ref 80.0–100.0)
Platelets: 263 10*3/uL (ref 150–400)
RBC: 2.79 MIL/uL — ABNORMAL LOW (ref 4.22–5.81)
RDW: 22.5 % — AB (ref 11.5–15.5)
WBC: 7.7 10*3/uL (ref 4.0–10.5)
nRBC: 0 % (ref 0.0–0.2)

## 2019-02-06 LAB — COMPREHENSIVE METABOLIC PANEL
ALT: 19 U/L (ref 0–44)
AST: 32 U/L (ref 15–41)
Albumin: 2.1 g/dL — ABNORMAL LOW (ref 3.5–5.0)
Alkaline Phosphatase: 81 U/L (ref 38–126)
Anion gap: 7 (ref 5–15)
BUN: 8 mg/dL (ref 8–23)
CO2: 25 mmol/L (ref 22–32)
Calcium: 8 mg/dL — ABNORMAL LOW (ref 8.9–10.3)
Chloride: 103 mmol/L (ref 98–111)
Creatinine, Ser: 0.6 mg/dL — ABNORMAL LOW (ref 0.61–1.24)
GFR calc Af Amer: >60 mL/min (ref >60)
GFR calc non Af Amer: >60 mL/min (ref >60)
Glucose, Bld: 133 mg/dL — ABNORMAL HIGH (ref 70–99)
POTASSIUM: 4 mmol/L (ref 3.5–5.1)
Sodium: 135 mmol/L (ref 135–145)
Total Bilirubin: 0.5 mg/dL (ref 0.3–1.2)
Total Protein: 5.8 g/dL — ABNORMAL LOW (ref 6.5–8.1)

## 2019-02-06 LAB — ABO/RH: ABO/RH(D): A POS

## 2019-02-06 LAB — PREPARE RBC (CROSSMATCH)

## 2019-02-06 MED ORDER — PANTOPRAZOLE SODIUM 40 MG PO TBEC
40.0000 mg | DELAYED_RELEASE_TABLET | Freq: Two times a day (BID) | ORAL | Status: DC
  Administered 2019-02-06: 40 mg via ORAL

## 2019-02-06 MED ORDER — SODIUM CHLORIDE 0.9% IV SOLUTION
Freq: Once | INTRAVENOUS | Status: AC
  Administered 2019-02-06: 06:00:00 via INTRAVENOUS

## 2019-02-06 MED ORDER — FUROSEMIDE 10 MG/ML IJ SOLN
40.0000 mg | Freq: Once | INTRAMUSCULAR | Status: AC
  Administered 2019-02-06: 40 mg via INTRAVENOUS
  Filled 2019-02-06: qty 4

## 2019-02-06 MED ORDER — DIAZEPAM 5 MG PO TABS
5.0000 mg | ORAL_TABLET | Freq: Once | ORAL | Status: AC
  Administered 2019-02-06: 5 mg via ORAL
  Filled 2019-02-06: qty 1

## 2019-02-06 MED ORDER — VITAMIN B-1 100 MG PO TABS: 100.0000 mg | ORAL_TABLET | Freq: Every day | ORAL | Status: DC

## 2019-02-06 MED ORDER — MIRTAZAPINE 15 MG PO TABS: 15.0000 mg | ORAL_TABLET | Freq: Every day | ORAL | Status: DC

## 2019-02-06 MED ORDER — FOLIC ACID 1 MG PO TABS
1.0000 mg | ORAL_TABLET | Freq: Every day | ORAL | Status: DC
  Administered 2019-02-06: 1 mg via ORAL
  Filled 2019-02-06: qty 1

## 2019-02-06 MED ORDER — ENSURE ENLIVE PO LIQD: 237.0000 mL | Freq: Three times a day (TID) | ORAL | Status: DC

## 2019-02-06 MED ORDER — DIAZEPAM 5 MG PO TABS: 5.0000 mg | ORAL_TABLET | Freq: Once | ORAL | Status: DC

## 2019-02-06 NOTE — Progress Notes (Signed)
Patient stated he wanted to leave the hospital. He states he has been here  "3 days and they have done all they need to do" Informed MD who came into patient's room and discussed the risks of leaving against medical advice. Pt still indicated that he wishes to leave because "it's his birthday [birth year] and he wants to celebrate" I again reiterated the risks of leaving AMA patient insists on leaving. AMA documentation has been signed and in chart. IV catheter removed and intact.

## 2019-02-06 NOTE — Consult Note (Signed)
Referring Provider: Dr. Shon Hale Primary Care Physician:  Patient, No Pcp Per Primary Gastroenterologist:  Dr. Darrick Penna (previously unassigned)  Date of Admission: 02/04/19 Date of Consultation: 02/06/19  Reason for Consultation:  IDA  HPI:  Spencer Hickman is a 63 y.o. year old male presenting with dyspnea, near syncopal episodes, fatigue, intermittent chest pain, found to have Hgb 7.8, ethanol level markedly elevated at 332, heme negative. Hgb down to 6.4 this morning and currently receiving 1 unit PRBCs. GI consulted due to IDA.  Patient states he has felt fatigued and weak for past 2 months. Notes close to "blacking out" on multiple occasions. States his friend found him on the floor and brought to ED. Doesn't remember coming to the ED. Noted dyspnea on exertion and vague chest pain. He denies overt GI bleeding. No changes in bowel habits. Appetite is poor. "Beer fills me up". 6-12 beers per day for close to 30 years. No known history of liver disease. Takes Aleve for joint pain. No recent care with PCP. Family member at bedside states he has not had an evaluation for many years. Last colonoscopy and EGD done at least 15 years ago per patient, but he is unsure the location. Overall limited historian. Notes he has lost weight over past few months but unable to quantify amount.   He does not want to pursue endoscopic evaluation while inpatient. 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 Willing to pursue as outpatient.   Past Medical History:  Diagnosis Date  . Alcohol abuse   . COPD (chronic obstructive pulmonary disease) (HCC)   . Coronary artery disease    States "had a mild heart attack years ago.  . Tobacco abuse 02/05/2019    Past Surgical History:  Procedure Laterality Date  . ABDOMINAL SURGERY     from [facility] asva child    Prior to Admission medications   Not on File    Current Facility-Administered Medications  Medication Dose Route Frequency Provider Last Rate  Last Dose  . atorvastatin (LIPITOR) tablet 10 mg  10 mg Oral q1800 Emokpae, Courage, MD   10 mg at 02/05/19 1818  . diazepam (VALIUM) tablet 5 mg  5 mg Oral Once Emokpae, Courage, MD      . diazepam (VALIUM) tablet 5 mg  5 mg Oral Once Emokpae, Courage, MD      . doxycycline (VIBRA-TABS) tablet 100 mg  100 mg Oral BID Mariea Clonts, Courage, MD   100 mg at 02/06/19 0904  . feeding supplement (ENSURE ENLIVE) (ENSURE ENLIVE) liquid 237 mL  237 mL Oral TID BM Emokpae, Courage, MD      . folic acid (FOLVITE) tablet 1 mg  1 mg Oral Daily Emokpae, Courage, MD      . guaiFENesin (MUCINEX) 12 hr tablet 600 mg  600 mg Oral BID Mariea Clonts, Courage, MD   600 mg at 02/06/19 0905  . ipratropium-albuterol (DUONEB) 0.5-2.5 (3) MG/3ML nebulizer solution 3 mL  3 mL Nebulization TID Shon Hale, MD   3 mL at 02/06/19 0846  . LORazepam (ATIVAN) injection 2-3 mg  2-3 mg Intravenous Q1H PRN Bobette Mo, MD      . metoprolol tartrate (LOPRESSOR) tablet 25 mg  25 mg Oral BID Shon Hale, MD   25 mg at 02/06/19 0904  . mirtazapine (REMERON) tablet 15 mg  15 mg Oral QHS Emokpae, Courage, MD      . nicotine (NICODERM CQ - dosed in mg/24 hours) patch 21 mg  21 mg Transdermal Q24H  Shon Hale, MD   21 mg at 02/06/19 0905  . ondansetron (ZOFRAN) injection 4 mg  4 mg Intravenous Q6H PRN Bobette Mo, MD      . pantoprazole (PROTONIX) EC tablet 40 mg  40 mg Oral BID Shon Hale, MD   40 mg at 02/06/19 0904  . predniSONE (DELTASONE) tablet 40 mg  40 mg Oral Q breakfast Mariea Clonts, Courage, MD   40 mg at 02/06/19 0904  . sodium chloride flush (NS) 0.9 % injection 3 mL  3 mL Intravenous Q12H Bobette Mo, MD   3 mL at 02/06/19 0905  . thiamine (B-1) injection 100 mg  100 mg Intravenous Daily Bobette Mo, MD   100 mg at 02/06/19 0569  . thiamine (VITAMIN B-1) tablet 100 mg  100 mg Oral Daily Shon Hale, MD        Allergies as of 02/04/2019  . (No Known Allergies)    Family History   Problem Relation Age of Onset  . Cancer Mother   . Stomach cancer Mother   . Brain cancer Father   . Cancer Sister   . Cancer Brother   . Colon cancer Neg Hx   . Colon polyps Neg Hx     Social History   Socioeconomic History  . Marital status: Legally Separated    Spouse name: Not on file  . Number of children: Not on file  . Years of education: Not on file  . Highest education level: Not on file  Occupational History  . Not on file  Social Needs  . Financial resource strain: Not on file  . Food insecurity:    Worry: Not on file    Inability: Not on file  . Transportation needs:    Medical: Not on file    Non-medical: Not on file  Tobacco Use  . Smoking status: Current Every Day Smoker    Packs/day: 2.00    Years: 25.00    Pack years: 50.00    Types: Cigarettes  . Smokeless tobacco: Never Used  Substance and Sexual Activity  . Alcohol use: Yes    Comment: 6-12 beers daily  . Drug use: Yes    Types: Marijuana  . Sexual activity: Not on file  Lifestyle  . Physical activity:    Days per week: Not on file    Minutes per session: Not on file  . Stress: Not on file  Relationships  . Social connections:    Talks on phone: Not on file    Gets together: Not on file    Attends religious service: Not on file    Active member of club or organization: Not on file    Attends meetings of clubs or organizations: Not on file    Relationship status: Not on file  . Intimate partner violence:    Fear of current or ex partner: Not on file    Emotionally abused: Not on file    Physically abused: Not on file    Forced sexual activity: Not on file  Other Topics Concern  . Not on file  Social History Narrative  . Not on file    Review of Systems: Gen: see HPI CV: see HPI Resp: see HPI GI: see HPI GU : Denies urinary burning, urinary frequency, urinary incontinence.  MS: see HPI Derm: Denies rash, itching, dry skin Psych: Denies depression, anxiety,confusion, or  memory loss Heme: Denies bruising, bleeding, and enlarged lymph nodes.  Physical Exam: Vital signs in last  24 hours: Temp:  [97.7 F (36.5 C)-99.7 F (37.6 C)] 97.7 F (36.5 C) (02/25 0905) Pulse Rate:  [57-110] 66 (02/25 1000) Resp:  [11-22] 13 (02/25 1000) BP: (84-130)/(56-87) 126/71 (02/25 1000) SpO2:  [94 %-100 %] 100 % (02/25 1000) Weight:  [59 kg] 59 kg (02/25 0500) Last BM Date: 02/05/19 General:   Alert,  Thin but not cachectic, chronically ill-appearing Head:  Normocephalic and atraumatic. Eyes:  Sclera clear, no icterus.   Ears:  Normal auditory acuity. Nose:  No deformity, discharge,  or lesions. Mouth:  No deformity or lesions, dentition normal. Lungs:  Scattered rhonchi, mild wheeze Heart:  S1 S2 present; no murmurs, clicks, rubs,  or gallops. Abdomen:  Soft, nontender and nondistended. No masses. Query hepatomegaly on exam, possible liver margin midline abdomen Rectal:  Deferred until time of colonoscopy.   Msk:  Symmetrical without gross deformities. Normal posture. Extremities:  Without edema. Neurologic:  Alert and  oriented x4 Psych:  Alert and cooperative. Normal mood and affect.  Intake/Output from previous day: 02/24 0701 - 02/25 0700 In: 1402.7 [P.O.:480; I.V.:922.7] Out: 1000 [Urine:1000] Intake/Output this shift: Total I/O In: 833.3 [Blood:833.3] Out: 925 [Urine:925]  Lab Results: Recent Labs    02/04/19 2358 02/05/19 1021 02/06/19 0427  WBC 5.6 3.9* 7.7  HGB 7.8* 7.5* 6.4*  HCT 27.1* 26.0* 22.2*  PLT 332 313 263   BMET Recent Labs    02/04/19 2358 02/06/19 0427  NA 135 135  K 3.9 4.0  CL 101 103  CO2 20* 25  GLUCOSE 99 133*  BUN 8 8  CREATININE 0.73 0.60*  CALCIUM 8.1* 8.0*   LFT Recent Labs    02/04/19 2358 02/06/19 0427  PROT 7.4 5.8*  ALBUMIN 2.6* 2.1*  AST 63* 32  ALT 25 19  ALKPHOS 109 81  BILITOT 0.2* 0.5   PT/INR Recent Labs    02/05/19 0340  LABPROT 13.5  INR 1.04   Lab Results  Component Value Date    IRON 7 (L) 02/05/2019   TIBC 329 02/05/2019   FERRITIN 9 (L) 02/05/2019   Lab Results  Component Value Date   VITAMINB12 245 02/05/2019   Lab Results  Component Value Date   FOLATE 10.2 02/05/2019     Studies/Results: Dg Chest 2 View  Result Date: 02/05/2019 CLINICAL DATA:  Generalized chest pain for 3 months. Increasing shortness of breath with normal activity. Syncopal episodes. History of COPD. Current smoker. EXAM: CHEST - 2 VIEW COMPARISON:  07/19/2017 FINDINGS: Normal heart size and pulmonary vascularity. Emphysematous changes in the lungs. Scattered central fibrosis suggesting chronic bronchitis. No focal consolidation. No blunting of costophrenic angles. No pneumothorax. Mediastinal contours appear intact. Degenerative changes in the spine and shoulders. IMPRESSION: Emphysematous and chronic bronchitic changes in the lungs. No evidence of active pulmonary disease. Electronically Signed   By: Burman Nieves M.D.   On: 02/05/2019 00:28   Ct Head Wo Contrast  Result Date: 02/05/2019 CLINICAL DATA:  Generalized chest pain for 3 months. Increasing shortness of breath with normal activity. Syncopal episodes. EXAM: CT HEAD WITHOUT CONTRAST TECHNIQUE: Contiguous axial images were obtained from the base of the skull through the vertex without intravenous contrast. COMPARISON:  06/09/2008 FINDINGS: Brain: Diffuse cerebral atrophy. Mild ventricular dilatation consistent with central atrophy. Low-attenuation changes in the deep white matter consistent small vessel ischemia. No mass-effect or midline shift. No abnormal extra-axial fluid collections. Gray-white matter junctions are distinct. Basal cisterns are not effaced. No acute intracranial hemorrhage. Vascular: Mild intracranial arterial  vascular calcifications are present. Skull: Calvarium appears intact. No acute depressed skull fractures. Sinuses/Orbits: Mucosal thickening in the right maxillary antrum. Paranasal sinuses and mastoid air  cells are otherwise clear. Other: None. IMPRESSION: No acute intracranial abnormalities. Chronic atrophy and small vessel ischemic changes. Chronic inflammatory changes in the right maxillary antrum. Electronically Signed   By: Burman Nieves M.D.   On: 02/05/2019 00:26   US Carotid Bilateral  Result Date: 02/05/2019 CLINICAL DATA:  63 year old male with syncope EXAM: BILATERAL CAROTID DUPLEX ULTRASOUND TECHNIQUE: Wallace Cullens scale imaging, color Doppler and duplex ultrasound were performed of bilateral carotid and vertebral arteries in the neck. COMPARISON:  None. FINDINGS: Criteria: Quantification of carotid stenosis is based on velocity parameters that correlate the residual internal carotid diameter with NASCET-based stenosis levels, using the diameter of the distal internal carotid lumen as the denominator for stenosis measurement. The following velocity measurements were obtained: RIGHT ICA: 93/22 cm/sec CCA: 93/22 cm/sec SYSTOLIC ICA/CCA RATIO:  1.0 ECA:  115 cm/sec LEFT ICA: 104/35 cm/sec CCA: 98/31 cm/sec SYSTOLIC ICA/CCA RATIO:  1.2 ECA:  119 cm/sec RIGHT CAROTID ARTERY: Minimal smooth atherosclerotic plaque in the proximal internal carotid artery. By peak systolic velocity criteria, the estimated stenosis remains less than 50%. RIGHT VERTEBRAL ARTERY:  Patent with normal antegrade flow. LEFT CAROTID ARTERY: Mild partially calcified atherosclerotic plaque in the proximal internal carotid artery. By peak systolic velocity criteria, the estimated stenosis remains less than 50%. LEFT VERTEBRAL ARTERY:  Patent with normal antegrade flow. IMPRESSION: 1. Mild (1-49%) stenosis proximal right internal carotid artery secondary to minimal smooth heterogeneous atherosclerotic plaque. 2. Mild (1-49%) stenosis proximal left internal carotid artery secondary to mild partially calcified heterogeneous atherosclerotic plaque. 3. The vertebral arteries are patent with normal antegrade flow. Signed, Sterling Big, MD,  RPVI [facility] Electronically Signed   By: Malachy Moan M.D.   On: 02/05/2019 15:57    Impression: 63 year old male found to have anemia without overt GI bleeding, heme negative stool, unknown baseline Hgb. 1 of 2 units PRBCs completed this morning. Anemia multifactorial in setting of chronic disease, alcohol use, and notable IDA. Last endoscopic evaluations per patient were at least 15 years ago (colonoscopy/EGD), but he is unsure where this was completed. He does not want to pursue endoscopic evaluation while inpatient. No known liver disease, but he does have almost a 30 year history of 6-12 beers per day. Query hepatomegaly on exam.   As he is unwilling to pursue endoscopic procedures while inpatient, may start soft diet. Discussed need for diagnostic colonoscopy/EGD in setting of IDA, weight loss. He is aware this is recommended but still desires to go home. Family member at bedside for discussion as well.   Plan: Start soft diet PPI BID US abdomen complete if patient is willing Needs colonoscopy/EGD due to IDA but desires as outpatient Monitor for overt GI bleeding Discussed ETOH cessation with patient Compliance remains a concern in this setting.   Gelene Mink, PhD, ANP-BC [facility]      LOS: 0 days    02/06/2019, 10:40 AM

## 2019-02-06 NOTE — Clinical Social Work Note (Signed)
Clinical Social Work Assessment  Patient Details  Name: Spencer Hickman MRN: [id number] Date of Birth: 1956-09-08  Date of referral:  02/06/19               Reason for consult:  Substance Use/ETOH Abuse                Permission sought to share information with:    Permission granted to share information::     Name::        Agency::     Relationship::     Contact Information:     Housing/Transportation Living arrangements for the past 2 months:  Single Family Home Source of Information:  Patient Patient Interpreter Needed:  None Criminal Activity/Legal Involvement Pertinent to Current Situation/Hospitalization:  No - Comment as needed Significant Relationships:  Other Family Members Lives with:  Self Do you feel safe going back to the place where you live?  Yes Need for family participation in patient care:  No (Coment)  Care giving concerns: Pt lives independently.   Social Worker assessment / plan: Pt is a 63 year old male referred to [place] for AODA related issues. Met with pt at bedside today to assess. Pt is very pleasant and is alert and oriented x4. He states that he lives alone but that he has a distant relative and his family that live in his camper on his property. Pt does not use any DME at home. He states that the family members assist him with shopping and taking him to appointments. Discussed pt's ETOH use with him. Pt states he doesn't feel like he drinks that much stating that some days he will have two beers, other days four, and some days none. He states that he feels like the hospital staff is making it seem like he drinks more than he does. Pt reports that he has been drinking from a very young age and that he doesn't feel like he needs/wants to stop. AODA treatment resources were offered though pt politely declined as he stated that he does not desire treatment at this time. He is aware that LCSW will be available if he changes his mind before dc.   Employment  status:  Retired Forensic scientist:  Medicare PT Recommendations:  No Follow Up Information / Referral to community resources:     Patient/Family's Response to care: Pt accepting of care.  Patient/Family's Understanding of and Emotional Response to Diagnosis, Current Treatment, and Prognosis: Pt appears to have understanding of diagnosis and treatment recommendations. No emotional distress identified.  Emotional Assessment Appearance:  Appears stated age Attitude/Demeanor/Rapport:  Engaged Affect (typically observed):  Pleasant, Calm Orientation:  Oriented to Self, Oriented to Place, Oriented to  Time, Oriented to Situation Alcohol / Substance use:  Alcohol Use Psych involvement (Current and /or in the community):  No (Comment)  Discharge Needs  Concerns to be addressed:  Patient refuses services Readmission within the last 30 days:  No Current discharge risk:  Substance Abuse Barriers to Discharge:  Active Substance Use   Shade Flood, LCSW 02/06/2019, 12:39 PM

## 2019-02-06 NOTE — Progress Notes (Signed)
Spencer Hickman Demographics:    Spencer Hickman, is a 63 y.o. male, DOB - [birth year], ZOX:[id number]  Admit date - 02/04/2019   Admitting Physician Bobette Mo, MD  Outpatient Primary MD for the Spencer Hickman is Spencer Hickman, No Pcp Per  LOS - 0   Chief Complaint  Spencer Hickman presents with  . Shortness of Breath        Subjective:    [name] today has no fevers, no emesis,   wants to eat, declines GI consult for possible endoluminal evaluation,   Assessment  & Plan :    Principal Problem:   Syncopal episodes Active Problems:   Microcytic anemia   COPD with acute exacerbation (HCC)   Alcohol abuse with intoxication (HCC)   Tobacco abuse   Hypoalbuminemia   Chest pain  Brief Summary:-  63 y.o.malewith medical history significant ofalcohol abuse, tobacco abuse, COPD, CAD, history of MI in 2007, noncompliance with medical treatment or follow-ups (has not seen cardiology since having MI) who admitted on 02/05/2019 with dyspnea with exertion ,  coughing and wheezing.. Symptoms have persisted for couple months however over the last couple of days cough and wheezing is gotten worse.  Also reports episodes of dizziness and fainting----BAL 332 (Spencer Hickman drinks over 12 beers each day), Spencer Hickman has not gone more than a couple days without drinking etoh in more than 20 years.He has passed out 2 or 3 times, most recently this past week, he has had several near syncopal episodes as well. He complains of on and off chest pain while exerting, but denies nausea, emesis or diaphoresis.   Hemoglobin down to 6.4, Hemoccult negative    Plan 1)Recurrent episodes of Near Syncope and syncope and  chest pain-----suspect due to significant alcoholic intoxication compounded by symptomatic anemia with orthostasis----echo this admission with preserved EF of 60 to 65%, grade 1 diastolic dysfunction, no regional wall motion  normalities, valvular abnormalities specifically no significant aortic stenosis, and carotid Dopplers without hemodynamically significant stenosis, EKG without ACS pattern,.  Serial troponin negative, CT head without acute findings...  On admission orthostatic vitals remarkable for drop in BP and elevation heart rate consistent with symptomatic orthostatic dizziness with positional change . Spencer Hickman not interested in stress test or coronary revascularization approach, he is notoriously noncompliant, had cardiac work-up in 2007 and has not been back for cardiovascular evaluation since then, please see negative nuclear stress test from 01/19/2006  2)Heavy Alcohol Abuse------ vitamins and lorazepam per CIWA protocol, not interested in alcohol rehab or detox , no evidence of DTs at this time  3)Heavy Tobacco Abuse----Spencer Hickman smokes more than 1-1/2 packs/day..... Not interested in smoking cessation, may use nicotine patch while in hospital  4)Iron deficiency Anemia---  suspect acute on chronic , exact etiology of his chronic anemia is unknown,  hemoglobin is down to 6.4,  on my rectal exam Spencer Hickman has brown stool that is Hemoccult negative at bedside--- no recent endoluminal evaluation,  Spencer Hickman declines EGD and colonoscopy during this hospitalization, please note that MCV is 82 with MCH of 23.7 most likely due to concomitant "Relative" B12/folate deficiency in the Spencer Hickman with very heavy EtOH use (  serum folate is 10.2 (WNL), B12 is 245 (low Normal), ferritin is 9 (very low), serum iron is 7 with  iron saturation of 2 (both very Low)-------- awaiting official GI consult, no evidence of ongoing GI bleed at this time... Risk, benefits and alternatives to transfusion of blood products discussed. Indication for transfusion discussed. Consent obtained Please Transfuse 2 units of packed red blood cells, please give each unit of PRBC over 3 hours, please give Lasix 40 mg IV x1 after first unit of packed cells is  infused   5)Recurrent falls/generalized weakness and debility---suspect due to orthostatic dizziness in the setting of symptomatic anemia and chronic alcohol intoxication-await physical therapy evaluation,orthostatic vitals positive on admission  6) acute COPD exacerbation-----cough and congestion noted, okay to continue prednisone, mucolytics, bronchodilators and doxycycline,, no significant hypoxia at this time  7)FTT/failure to thrive and protein caloric malnutrition-----due to heavy alcohol abuse with reduced oral intake, give Remeron for appetite stimulation, encourage supplement intake  Disposition/Need for in-Hospital Stay- Spencer Hickman unable to be discharged at this time due to symptomatic anemia with orthostatic symptoms and with falls/syncope requiring transfusion of 2 units of packed cells  Code Status : Full   Family Communication:   Brother    Disposition Plan  : He wants to go home and drink and smoke  Consults  :  Gi  DVT Prophylaxis  :  - SCDs   Lab Results  Component Value Date   PLT 263 02/06/2019    Inpatient Medications  Scheduled Meds: . atorvastatin  10 mg Oral q1800  . doxycycline  100 mg Oral BID  . guaiFENesin  600 mg Oral BID  . ipratropium-albuterol  3 mL Nebulization TID  . metoprolol tartrate  25 mg Oral BID  . nicotine  21 mg Transdermal Q24H  . pantoprazole  40 mg Oral BID  . predniSONE  40 mg Oral Q breakfast  . sodium chloride flush  3 mL Intravenous Q12H  . thiamine injection  100 mg Intravenous Daily   Continuous Infusions: PRN Meds:.LORazepam, ondansetron (ZOFRAN) IV    Anti-infectives (From admission, onward)   Start     Dose/Rate Route Frequency Ordered Stop   02/05/19 1530  doxycycline (VIBRA-TABS) tablet 100 mg     100 mg Oral 2 times daily 02/05/19 1456          Objective:   Vitals:   02/06/19 0600 02/06/19 0637 02/06/19 0700 02/06/19 0747  BP: 120/73 104/61 126/78   Pulse: (!) 59 60 69 (!) 58  Resp: 13 12 15 12   Temp:  98.1 F (36.7 C) 97.7 F (36.5 C)  99.5 F (37.5 C)  TempSrc: Oral Oral  Oral  SpO2: 100% 100% 100% 100%  Weight:      Height:        Wt Readings from Last 3 Encounters:  02/06/19 59 kg     Intake/Output Summary (Last 24 hours) at 02/06/2019 1005 Last data filed at 02/06/2019 0500 Gross per 24 hour  Intake 1399.71 ml  Output 350 ml  Net 1049.71 ml     Physical Exam Spencer Hickman is examined daily including today on 02/06/19 , exams remain the same as of yesterday except that has changed   Gen:- Awake Alert, cachectic appearing HEENT:- Susan Moore.AT, No sclera icterus Neck-Supple Neck,No JVD,.  Lungs-diminished with a few scattered wheezes CV- S1, S2 normal, regular  Abd-  +ve B.Sounds, Abd Soft, No tenderness,    Extremity/Skin:- No  edema, pedal pulses present  Psych-affect is appropriate, oriented x3 Neuro-no new focal deficits, no tremors   Data Review:   Micro Results Recent Results (from the past 240  hour(s))  MRSA PCR Screening     Status: None   Collection Time: 02/05/19  8:44 AM  Result Value Ref Range Status   MRSA by PCR NEGATIVE NEGATIVE Final    Comment:        The GeneXpert MRSA Assay (FDA approved for NASAL specimens only), is one component of a comprehensive MRSA colonization surveillance program. It is not intended to diagnose MRSA infection nor to guide or monitor treatment for MRSA infections. Performed at [facility], [address] [place], [place] [address]     Radiology Reports Dg Chest 2 View  Result Date: 02/05/2019 CLINICAL DATA:  Generalized chest pain for 3 months. Increasing shortness of breath with normal activity. Syncopal episodes. History of COPD. Current smoker. EXAM: CHEST - 2 VIEW COMPARISON:  07/19/2017 FINDINGS: Normal heart size and pulmonary vascularity. Emphysematous changes in the lungs. Scattered central fibrosis suggesting chronic bronchitis. No focal consolidation. No blunting of costophrenic angles. No pneumothorax.  Mediastinal contours appear intact. Degenerative changes in the spine and shoulders. IMPRESSION: Emphysematous and chronic bronchitic changes in the lungs. No evidence of active pulmonary disease. Electronically Signed   By: Burman Nieves M.D.   On: 02/05/2019 00:28   Ct Head Wo Contrast  Result Date: 02/05/2019 CLINICAL DATA:  Generalized chest pain for 3 months. Increasing shortness of breath with normal activity. Syncopal episodes. EXAM: CT HEAD WITHOUT CONTRAST TECHNIQUE: Contiguous axial images were obtained from the base of the skull through the vertex without intravenous contrast. COMPARISON:  06/09/2008 FINDINGS: Brain: Diffuse cerebral atrophy. Mild ventricular dilatation consistent with central atrophy. Low-attenuation changes in the deep white matter consistent small vessel ischemia. No mass-effect or midline shift. No abnormal extra-axial fluid collections. Gray-white matter junctions are distinct. Basal cisterns are not effaced. No acute intracranial hemorrhage. Vascular: Mild intracranial arterial vascular calcifications are present. Skull: Calvarium appears intact. No acute depressed skull fractures. Sinuses/Orbits: Mucosal thickening in the right maxillary antrum. Paranasal sinuses and mastoid air cells are otherwise clear. Other: None. IMPRESSION: No acute intracranial abnormalities. Chronic atrophy and small vessel ischemic changes. Chronic inflammatory changes in the right maxillary antrum. Electronically Signed   By: Burman Nieves M.D.   On: 02/05/2019 00:26   US Carotid Bilateral  Result Date: 02/05/2019 CLINICAL DATA:  63 year old male with syncope EXAM: BILATERAL CAROTID DUPLEX ULTRASOUND TECHNIQUE: Wallace Cullens scale imaging, color Doppler and duplex ultrasound were performed of bilateral carotid and vertebral arteries in the neck. COMPARISON:  None. FINDINGS: Criteria: Quantification of carotid stenosis is based on velocity parameters that correlate the residual internal carotid  diameter with NASCET-based stenosis levels, using the diameter of the distal internal carotid lumen as the denominator for stenosis measurement. The following velocity measurements were obtained: RIGHT ICA: 93/22 cm/sec CCA: 93/22 cm/sec SYSTOLIC ICA/CCA RATIO:  1.0 ECA:  115 cm/sec LEFT ICA: 104/35 cm/sec CCA: 98/31 cm/sec SYSTOLIC ICA/CCA RATIO:  1.2 ECA:  119 cm/sec RIGHT CAROTID ARTERY: Minimal smooth atherosclerotic plaque in the proximal internal carotid artery. By peak systolic velocity criteria, the estimated stenosis remains less than 50%. RIGHT VERTEBRAL ARTERY:  Patent with normal antegrade flow. LEFT CAROTID ARTERY: Mild partially calcified atherosclerotic plaque in the proximal internal carotid artery. By peak systolic velocity criteria, the estimated stenosis remains less than 50%. LEFT VERTEBRAL ARTERY:  Patent with normal antegrade flow. IMPRESSION: 1. Mild (1-49%) stenosis proximal right internal carotid artery secondary to minimal smooth heterogeneous atherosclerotic plaque. 2. Mild (1-49%) stenosis proximal left internal carotid artery secondary to mild partially calcified heterogeneous atherosclerotic plaque.  3. The vertebral arteries are patent with normal antegrade flow. Signed, Sterling Big, MD, RPVI [facility] Electronically Signed   By: Malachy Moan M.D.   On: 02/05/2019 15:57     CBC Recent Labs  Lab 02/04/19 2358 02/05/19 1021 02/06/19 0427  WBC 5.6 3.9* 7.7  HGB 7.8* 7.5* 6.4*  HCT 27.1* 26.0* 22.2*  PLT 332 313 263  MCV 79.5* 82.3 79.6*  MCH 22.9* 23.7* 22.9*  MCHC 28.8* 28.8* 28.8*  RDW 23.4* 23.4* 22.5*  LYMPHSABS 1.9  --   --   MONOABS 0.4  --   --   EOSABS 0.1  --   --   BASOSABS 0.1  --   --     Chemistries  Recent Labs  Lab 02/04/19 2358 02/05/19 0340 02/06/19 0427  NA 135  --  135  K 3.9  --  4.0  CL 101  --  103  CO2 20*  --  25  GLUCOSE 99  --  133*  BUN 8  --  8    CREATININE 0.73  --  0.60*  CALCIUM 8.1*  --  8.0*  MG  --  2.0  --   AST 63*  --  32  ALT 25  --  19  ALKPHOS 109  --  81  BILITOT 0.2*  --  0.5   ------------------------------------------------------------------------------------------------------------------ No results for input(s): CHOL, HDL, LDLCALC, TRIG, CHOLHDL, LDLDIRECT in the last 72 hours.  No results found for: HGBA1C ------------------------------------------------------------------------------------------------------------------ No results for input(s): TSH, T4TOTAL, T3FREE, THYROIDAB in the last 72 hours.  Invalid input(s): FREET3 ------------------------------------------------------------------------------------------------------------------ Recent Labs    02/05/19 0340  VITAMINB12 245  FOLATE 10.2  FERRITIN 9*  TIBC 329  IRON 7*  RETICCTPCT 1.4    Coagulation profile Recent Labs  Lab 02/05/19 0340  INR 1.04    No results for input(s): DDIMER in the last 72 hours.  Cardiac Enzymes Recent Labs  Lab 02/04/19 2358 02/05/19 0340 02/05/19 1021  TROPONINI <0.03 <0.03 <0.03   ------------------------------------------------------------------------------------------------------------------    Component Value Date/Time   BNP 64.0 02/04/2019 2358     Maylee Bare M.D on 02/06/2019 at 10:05 AM  Go to [email or web address] - for contact info  [facility] - Office  [phone]

## 2019-02-06 NOTE — Care Management Obs Status (Signed)
MEDICARE OBSERVATION STATUS NOTIFICATION   Patient Details  Name: Spencer Hickman MRN: [id number] Date of Birth: 1956/06/16   Medicare Observation Status Notification Given:  Yes    Omarii Scalzo, Chrystine Oiler, RN 02/06/2019, 7:38 AM

## 2019-02-06 NOTE — Evaluation (Signed)
Physical Therapy Evaluation Patient Details Name: Spencer Hickman MRN: [id number] DOB: 21-Jan-1956 Today's Date: 02/06/2019   History of Present Illness  Spencer Hickman is a 63 y.o. male with medical history significant of alcohol abuse, tobacco abuse, COPD, CAD, history of MI in 2007, noncompliance with medical treatment or follow-ups (has not seen cardiology since having MI) who is coming to the emergency department with multiple complaints, including feeling progressively worse dyspnea associated with nonproductive coughing and wheezing for the past 2 days, but states that for the past 2 to 3 months he has not felt well.  He has been lightheaded often.  He has passed out 2 or 3 times, most recently this past week, he has had several near syncopal episodes as well.  He complains of on and off chest pain while exerting, but denies nausea, emesis or diaphoresis.  He denies orthopnea or lower extremity edema.  He denies abdominal pain, nausea, emesis or hematemesis, diarrhea or constipation, melena or hematochezia.  He denies dysuria, frequency or hematuria.  He denies polyuria, polydipsia, polyphagia or blurred vision.    Clinical Impression  Patient functioning at baseline for functional mobility and gait.  Plan:  Patient discharged from physical therapy to care of nursing for ambulation daily as tolerated for length of stay.     Follow Up Recommendations No PT follow up    Equipment Recommendations  None recommended by PT    Recommendations for Other Services       Precautions / Restrictions Precautions Precautions: None Restrictions Weight Bearing Restrictions: No      Mobility  Bed Mobility Overal bed mobility: Independent                Transfers Overall transfer level: Independent                  Ambulation/Gait Ambulation/Gait assistance: Modified independent (Device/Increase time) Gait Distance (Feet): 200 Feet Assistive device: None Gait  Pattern/deviations: WFL(Within Functional Limits) Gait velocity: slightly decreased   General Gait Details: demonstrates good return for ambulation in room and in hallway, make 180 degree and 360 degree turns without loss of balance  Stairs            Wheelchair Mobility    Modified Rankin (Stroke Patients Only)       Balance Overall balance assessment: No apparent balance deficits (not formally assessed)                                           Pertinent Vitals/Pain Pain Assessment: No/denies pain    Home Living Family/patient expects to be discharged to:: Private residence Living Arrangements: Alone Available Help at Discharge: Friend(s) Type of Home: House Home Access: Level entry     Home Layout: One level Home Equipment: Cane - single point Additional Comments: has friends that live in camper next door to his house on the same lot    Prior Function Level of Independence: Independent         Comments: Tourist information centre manager, 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, "per patient"     Hand Dominance        Extremity/Trunk Assessment   Upper Extremity Assessment Upper Extremity Assessment: Overall WFL for tasks assessed    Lower Extremity Assessment Lower Extremity Assessment: Overall WFL for tasks assessed    Cervical / Trunk Assessment Cervical / Trunk Assessment: Normal  Communication  Communication: No difficulties  Cognition Arousal/Alertness: Awake/alert Behavior During Therapy: WFL for tasks assessed/performed Overall Cognitive Status: Within Functional Limits for tasks assessed                                        General Comments      Exercises     Assessment/Plan    PT Assessment Patent does not need any further PT services  PT Problem List         PT Treatment Interventions      PT Goals (Current goals can be found in the Care Plan section)  Acute Rehab PT Goals Patient Stated Goal: return  home PT Goal Formulation: With patient Time For Goal Achievement: 02/06/19 Potential to Achieve Goals: Good    Frequency     Barriers to discharge        Co-evaluation               AM-PAC PT "6 Clicks" Mobility  Outcome Measure Help needed turning from your back to your side while in a flat bed without using bedrails?: None Help needed moving from lying on your back to sitting on the side of a flat bed without using bedrails?: None Help needed moving to and from a bed to a chair (including a wheelchair)?: None Help needed standing up from a chair using your arms (e.g., wheelchair or bedside chair)?: None Help needed to walk in hospital room?: None Help needed climbing 3-5 steps with a railing? : None 6 Click Score: 24    End of Session   Activity Tolerance: Patient tolerated treatment well Patient left: in bed;with call bell/phone within reach Nurse Communication: Mobility status PT Visit Diagnosis: Unsteadiness on feet (R26.81);Other abnormalities of gait and mobility (R26.89);Muscle weakness (generalized) (M62.81)    Time: 0962-8366 PT Time Calculation (min) (ACUTE ONLY): 22 min   Charges:   PT Evaluation $PT Eval Moderate Complexity: 1 Mod PT Treatments $Gait Training: 8-22 mins        1:52 PM, 02/06/19 Ocie Bob, MPT Physical Therapist with [facility] [phone] office [phone] mobile phone

## 2019-02-06 NOTE — Care Management Note (Signed)
Case Management Note  Patient Details  Name: Spencer Hickman MRN: [id number] Date of Birth: 16-Jun-1956  Subjective/Objective:    Syncopal episode. From home, independent. ETOH abuse, smokes. No PCP. Has Medicare. Reports he is working on getting Medicaid. Discussed PCP's in area. Patient is interested in [facility] here in [place]. He is aware he must go by office and fill out new patient packet prior to appointment being made. He plans to do so at time of DC. He has been seen by PT and has no recommendations.                Action/Plan: DC home. Plans to follow up with [facility] for primary care.  Expected Discharge Date:     02/07/19             Expected Discharge Plan:  Home/Self Care  In-House Referral:     Discharge planning Services  CM Consult, Other - See comment(PCP list)  Post Acute Care Choice:  NA Choice offered to:  NA  DME Arranged:    DME Agency:     HH Arranged:    HH Agency:     Status of Service:  Completed, signed off  If discussed at [employer] of Stay Meetings, dates discussed:    Additional Comments:  Raevin Wierenga, Chrystine Oiler, RN 02/06/2019, 11:13 AM

## 2019-02-06 NOTE — Discharge Summary (Addendum)
Spencer Hickman, is a 63 y.o. male  DOB [birth year]  MRN [id number].  Admission date:  02/04/2019  Admitting Physician  Reubin Milan, MD  Discharge Date:  02/06/2019   Primary MD  Patient, No Pcp Per  Recommendations for primary care physician for things to follow:   Left AMA--    Admission Diagnosis  Alcohol abuse [F10.10] Seizure ([place]) [R56.9] DOE (dyspnea on exertion) [R06.09] Syncope, unspecified syncope type [R55] Other iron deficiency anemia [D50.8] Chronic obstructive pulmonary disease, unspecified COPD type ([place]) [J44.9]   Discharge Diagnosis  Alcohol abuse [F10.10] Seizure ([place]) [R56.9] DOE (dyspnea on exertion) [R06.09] Syncope, unspecified syncope type [R55] Other iron deficiency anemia [D50.8] Chronic obstructive pulmonary disease, unspecified COPD type ([place]) [J44.9]    Principal Problem:   Syncopal episodes Active Problems:   Microcytic anemia   COPD with acute exacerbation ([place])   Alcohol abuse with intoxication ([place])   Tobacco abuse   Hypoalbuminemia   Chest pain   Symptomatic anemia   Absolute anemia      Past Medical History:  Diagnosis Date  . Alcohol abuse   . COPD (chronic obstructive pulmonary disease) ([place])   . Coronary artery disease    States "had a mild heart attack years ago.  . Tobacco abuse 02/05/2019    Past Surgical History:  Procedure Laterality Date  . ABDOMINAL SURGERY     from [place] child     HPI  from the history and physical done on the day of admission:     Chief Complaint: Syncopal episodes  HPI: Spencer Hickman is a 63 y.o. male with medical history significant of alcohol abuse, tobacco abuse, COPD, CAD, history of MI in 2007, noncompliance with medical treatment or follow-ups (has not seen cardiology since having MI) who is coming to the emergency department with multiple complaints, including feeling progressively worse  dyspnea associated with nonproductive coughing and wheezing for the past 2 days, but states that for the past 2 to 3 months he has not felt well.  He has been lightheaded often.  He has passed out 2 or 3 times, most recently this past week, he has had several near syncopal episodes as well.  He complains of on and off chest pain while exerting, but denies nausea, emesis or diaphoresis.  He denies orthopnea or lower extremity edema.  He denies abdominal pain, nausea, emesis or hematemesis, diarrhea or constipation, melena or hematochezia.  He denies dysuria, frequency or hematuria.  He denies polyuria, polydipsia, polyphagia or blurred vision.  ED Course: Initial vital signs temperature 98.2 F, pulse 96, respirations 21, blood pressure 154/99 mmHg and O2 sat 84% on room air.  Patient received supplemental oxygen and a 5 mg albuterol continuous nebulizer treatment.  I ordered magnesium sulfate 2 g IVPB, Solu-Medrol 40 mg IVP and at 1000 mL NS bolus.  His white count was 5.6 with a normal differential, hemoglobin 7.8 g/dL with an MCV of 79.54 fL and platelets 332.  PT/INR within normal limits troponin level was  normal.  EKG was sinus tachycardia with LAD, questionable LAFB.  There was baseline wandering electrode noise.  There were no previous EKGs to compare to. BNP was 64.0 pg/mL.  CMP shows CO2 of 20 mmol/L and calcium of 8.1 mg/dL.  All other electrolytes are normal.  Renal function within expected values.  Total protein was 7.4, but albumin was low at 2.6 g/dL.  AST was 63 units/L.  ALT and alk phos were normal.  Total bilirubin is low at 0.2 and alcohol is 332 mg/dL.  Imaging: CT head showed chronic atrophy and small vessel ischemic changes.  There were chronic inflammatory changes in the right maxillary antrum.  However, there were no acute intracranial abnormalities.  His chest radiograph shows emphysematous and chronic bronchitic changes in the lungs.  There is no evidence of active pulmonary disease  though.  Please see images and full radiology report for further detail.   Hospital Course:    Brief Summary:- 63 y.o.malewith medical history significant ofalcohol abuse, tobacco abuse, COPD, CAD, history of MI in 2007, noncompliance with medical treatment or follow-ups (has not seen cardiology since having MI) whoadmitted on 02/05/2019 with dyspnea with exertion,coughing and wheezing..Symptoms have persisted for couple months however over the last couple of days cough and wheezing is gotten worse.Also reports episodes of dizziness and fainting----BAL 332 (patient drinks over 12 beers each day),patient has not gone more than a couple days without drinkingetohin more than 20 years.He has passed out 2 or 3 times, most recently this past week, he has had several near syncopal episodes as well. He complains of on and off chest pain while exerting, but denies nausea, emesis or diaphoresis.  Hemoglobin down to 6.4, Hemoccult negative   Plan 1)Recurrent episodes ofNearSyncope and syncopeandchest pain-----suspect due to significant alcoholic intoxication compounded by symptomatic anemia with orthostasis----echo this admission with preserved EF of 60 to 54%, grade 1 diastolic dysfunction, no regional wall motion normalities, valvular abnormalities specifically no significant aortic stenosis, and carotid Dopplers without hemodynamically significant stenosis, EKG without ACS pattern,.Serial troponin negative,CT head without acute findings... On admission orthostatic vitals remarkable for drop in BP and elevation heart rate consistent with symptomatic orthostatic dizziness with positional change . patient not interested in stress test or coronary revascularization approach, he is notoriously noncompliant, had cardiac work-up in 2007 and has not been back for cardiovascular evaluation since then, please see negative nuclear stress test from 01/19/2006  2)HeavyAlcoholAbuse------vitamins  and lorazepam per CIWA protocol,not interested in alcohol rehab or detox , no evidence of DTs at this time  3)HeavyTobaccoAbuse----patient smokes more than 1-1/2 packs/day..... Smoking cessation counseling for 4 minutes today, consider nicotine patch  I have discussed tobacco cessation with the patient.  I have counseled the patient regarding the negative impacts of continued tobacco use including but not limited to lung cancer, COPD, and cardiovascular disease.  I have discussed alternatives to tobacco and modalities that may help facilitate tobacco cessation including but not limited to biofeedback, hypnosis, and medications.  Total time spent with tobacco counseling was 4 minutes.      4)Iron deficiencyAnemia--- suspect acute on chronic , exact etiology of his chronic anemia is unknown,  hemoglobin is down to 6.4, on my rectal exam patient has brown stool that is Hemoccult negative at bedside---no recent endoluminal evaluation, patient declines EGD and colonoscopy during this hospitalization, please note that MCV is 82 with MCH of 23.7 most likely due to concomitant"Relative"B12/folate deficiency in the patient with very heavy EtOH use(serum folate is 10.2 (WNL),B12  is 245(low Normal),ferritin is 9(very low),serum iron is 7 with iron saturation of 2(both very Low)-------- official GI consult requested, no evidence of ongoing GI bleed at this time... Patient received 2 units of packed cells--- patient refused to wait around for repeat CBC he left AMA  5)Recurrent falls/generalized weakness and debility---suspect due to orthostatic dizziness in the setting of symptomatic anemia and chronic alcohol intoxication-await physical therapy evaluation,orthostatic vitals positive on admission----patient left AMA  6)acute COPD exacerbation-----cough and congestion noted, treated with prednisone, mucolytics, bronchodilators and doxycycline,,no significant hypoxia at this time--- left  AMA  7)FTT/failure to thrive and protein caloric malnutrition-----due to heavy alcohol abuse with reduced oral intake, patient left AMA    Code Status : Full   Family Communication:   Brother    Disposition Plan  : He wants to go home and drink and smoke as his birthday is coming up  Consults  :  Gi  DVT Prophylaxis  :  - SCDs    Discharge Condition: Left AMA  Follow UP--- for CBC recheck  Diet and Activity recommendation:  As advised  Discharge Instructions      Discharge Medications       Major procedures and Radiology Reports - PLEASE review detailed and final reports for all details, in brief -   Dg Chest 2 View  Result Date: 02/05/2019 CLINICAL DATA:  Generalized chest pain for 3 months. Increasing shortness of breath with normal activity. Syncopal episodes. History of COPD. Current smoker. EXAM: CHEST - 2 VIEW COMPARISON:  07/19/2017 FINDINGS: Normal heart size and pulmonary vascularity. Emphysematous changes in the lungs. Scattered central fibrosis suggesting chronic bronchitis. No focal consolidation. No blunting of costophrenic angles. No pneumothorax. Mediastinal contours appear intact. Degenerative changes in the spine and shoulders. IMPRESSION: Emphysematous and chronic bronchitic changes in the lungs. No evidence of active pulmonary disease. Electronically Signed   By: Lucienne Capers M.D.   On: 02/05/2019 00:28   Ct Head Wo Contrast  Result Date: 02/05/2019 CLINICAL DATA:  Generalized chest pain for 3 months. Increasing shortness of breath with normal activity. Syncopal episodes. EXAM: CT HEAD WITHOUT CONTRAST TECHNIQUE: Contiguous axial images were obtained from the base of the skull through the vertex without intravenous contrast. COMPARISON:  06/09/2008 FINDINGS: Brain: Diffuse cerebral atrophy. Mild ventricular dilatation consistent with central atrophy. Low-attenuation changes in the deep white matter consistent small vessel ischemia. No  mass-effect or midline shift. No abnormal extra-axial fluid collections. Gray-white matter junctions are distinct. Basal cisterns are not effaced. No acute intracranial hemorrhage. Vascular: Mild intracranial arterial vascular calcifications are present. Skull: Calvarium appears intact. No acute depressed skull fractures. Sinuses/Orbits: Mucosal thickening in the right maxillary antrum. Paranasal sinuses and mastoid air cells are otherwise clear. Other: None. IMPRESSION: No acute intracranial abnormalities. Chronic atrophy and small vessel ischemic changes. Chronic inflammatory changes in the right maxillary antrum. Electronically Signed   By: Lucienne Capers M.D.   On: 02/05/2019 00:26   US Abdomen Complete  Result Date: 02/06/2019 CLINICAL DATA:  Anemia.  Hepatomegaly.  Weight loss.  Alcohol abuse. EXAM: ABDOMEN ULTRASOUND COMPLETE COMPARISON:  None. FINDINGS: Gallbladder: No gallstones or wall thickening visualized. No sonographic Murphy sign noted by sonographer. Common bile duct: Diameter: 2.3 mm Liver: Diffusely echogenic. Portal vein is patent on color Doppler imaging with normal direction of blood flow towards the liver. IVC: No abnormality visualized. Pancreas: Visualized portion unremarkable. Spleen: Size and appearance within normal limits. Right Kidney: Length: 10.1 cm. Echogenicity within normal limits. No mass or hydronephrosis  visualized. Left Kidney: Length: 11.7 cm. Echogenicity within normal limits. No mass or hydronephrosis visualized. Abdominal aorta: No aneurysm visualized. Other findings: None. IMPRESSION: 1. Diffusely echogenic liver, most likely due to steatosis. 2. Otherwise, unremarkable examination. No gross enlargement of the liver. Electronically Signed   By: Claudie Revering M.D.   On: 02/06/2019 14:16   US Carotid Bilateral  Result Date: 02/05/2019 CLINICAL DATA:  63 year old male with syncope EXAM: BILATERAL CAROTID DUPLEX ULTRASOUND TECHNIQUE: Pearline Cables scale imaging, color Doppler  and duplex ultrasound were performed of bilateral carotid and vertebral arteries in the neck. COMPARISON:  None. FINDINGS: Criteria: Quantification of carotid stenosis is based on velocity parameters that correlate the residual internal carotid diameter with NASCET-based stenosis levels, using the diameter of the distal internal carotid lumen as the denominator for stenosis measurement. The following velocity measurements were obtained: RIGHT ICA: 93/22 cm/sec CCA: 20/25 cm/sec SYSTOLIC ICA/CCA RATIO:  1.0 ECA:  115 cm/sec LEFT ICA: 104/35 cm/sec CCA: 42/70 cm/sec SYSTOLIC ICA/CCA RATIO:  1.2 ECA:  119 cm/sec RIGHT CAROTID ARTERY: Minimal smooth atherosclerotic plaque in the proximal internal carotid artery. By peak systolic velocity criteria, the estimated stenosis remains less than 50%. RIGHT VERTEBRAL ARTERY:  Patent with normal antegrade flow. LEFT CAROTID ARTERY: Mild partially calcified atherosclerotic plaque in the proximal internal carotid artery. By peak systolic velocity criteria, the estimated stenosis remains less than 50%. LEFT VERTEBRAL ARTERY:  Patent with normal antegrade flow. IMPRESSION: 1. Mild (1-49%) stenosis proximal right internal carotid artery secondary to minimal smooth heterogeneous atherosclerotic plaque. 2. Mild (1-49%) stenosis proximal left internal carotid artery secondary to mild partially calcified heterogeneous atherosclerotic plaque. 3. The vertebral arteries are patent with normal antegrade flow. Signed, Criselda Peaches, MD, [facility] Radiology Electronically Signed   By: Jacqulynn Cadet M.D.   On: 02/05/2019 15:57    Micro Results    Recent Results (from the past 240 hour(s))  MRSA PCR Screening     Status: None   Collection Time: 02/05/19  8:44 AM  Result Value Ref Range Status   MRSA by PCR NEGATIVE NEGATIVE Final    Comment:        The GeneXpert MRSA Assay (FDA approved for NASAL specimens only), is  one component of a comprehensive MRSA colonization surveillance program. It is not intended to diagnose MRSA infection nor to guide or monitor treatment for MRSA infections. Performed at [facility], [address] [place], [place] [address]        Today   Subjective    [place] today has no new complaints, no fevers, no chills, patient just wants to go home and drink and smoke, refusing further interventions, patient left AGAINST MEDICAL ADVICE despite persuasion to the contrary         Objective   Blood pressure 102/69, pulse 64, temperature 98.2 F (36.8 C), temperature source Oral, resp. rate 14, height '5\' 11"'$  (1.803 m), weight 59 kg, SpO2 99 %.   Intake/Output Summary (Last 24 hours) at 02/06/2019 1829 Last data filed at 02/06/2019 1425 Gross per 24 hour  Intake 2430.41 ml  Output 2600 ml  Net -169.59 ml    Exam Gen:- Awake Alert, cachectic appearing HEENT:- [place].AT, No sclera icterus Neck-Supple Neck,No JVD,.  Lungs-diminished with a few scattered wheezes CV- S1, S2 normal, regular  Abd-  +ve B.Sounds, Abd Soft, No tenderness,    Extremity/Skin:- No  edema, pedal pulses present  Psych-affect is appropriate, oriented x3 Neuro-no new focal deficits,  no tremors   Data Review   CBC w Diff:  Lab Results  Component Value Date   WBC 7.7 02/06/2019   HGB 6.4 (LL) 02/06/2019   HCT 22.2 (L) 02/06/2019   PLT 263 02/06/2019   LYMPHOPCT 34 02/04/2019   MONOPCT 7 02/04/2019   EOSPCT 2 02/04/2019   BASOPCT 1 02/04/2019    CMP:  Lab Results  Component Value Date   NA 135 02/06/2019   K 4.0 02/06/2019   CL 103 02/06/2019   CO2 25 02/06/2019   BUN 8 02/06/2019   CREATININE 0.60 (L) 02/06/2019   PROT 5.8 (L) 02/06/2019   ALBUMIN 2.1 (L) 02/06/2019   BILITOT 0.5 02/06/2019   ALKPHOS 81 02/06/2019   AST 32 02/06/2019   ALT 19 02/06/2019  .   Total Discharge time is about 33 minutes  Roxan Hockey M.D on 02/06/2019 at 6:29 PM  Go to  [email or web address] -  for contact info  [facility] - Office  [phone]

## 2019-02-06 NOTE — Progress Notes (Signed)
CRITICAL VALUE ALERT  Critical Value:  Hemoglobin 6.4  Date & Time Notied:  02/06/19 3244  Provider Notified: Robb Matar, MD  Orders Received/Actions taken: None at this time

## 2019-02-07 ENCOUNTER — Telehealth: Payer: Self-pay | Admitting: Gastroenterology

## 2019-02-07 LAB — BPAM RBC
Blood Product Expiration Date: 202003212359
Blood Product Expiration Date: 202003222359
ISSUE DATE / TIME: 202002250618
ISSUE DATE / TIME: 202002251059
Unit Type and Rh: 6200
Unit Type and Rh: 6200

## 2019-02-07 LAB — TYPE AND SCREEN
ABO/RH(D): A POS
Antibody Screen: NEGATIVE
Unit division: 0
Unit division: 0

## 2019-02-07 NOTE — Telephone Encounter (Signed)
Please arrange outpatient follow-up from hospital. We need to arrange colonoscopy/EGD.

## 2019-02-09 ENCOUNTER — Encounter: Payer: Self-pay | Admitting: Gastroenterology

## 2019-02-09 NOTE — Telephone Encounter (Signed)
scheduled

## 2019-04-11 ENCOUNTER — Ambulatory Visit: Payer: Medicare Other | Admitting: Gastroenterology

## 2019-04-13 ENCOUNTER — Ambulatory Visit: Payer: Medicare Other | Admitting: Gastroenterology

## 2020-02-14 ENCOUNTER — Inpatient Hospital Stay (HOSPITAL_COMMUNITY)
Admission: EM | Admit: 2020-02-14 | Discharge: 2020-02-18 | DRG: 811 | Disposition: A | Discharge status: Home / Self Care | Payer: PPO | Attending: Family Medicine | Admitting: Family Medicine

## 2020-02-14 ENCOUNTER — Encounter (HOSPITAL_COMMUNITY): Payer: Self-pay | Admitting: Emergency Medicine

## 2020-02-14 ENCOUNTER — Other Ambulatory Visit: Payer: Self-pay

## 2020-02-14 ENCOUNTER — Emergency Department (HOSPITAL_COMMUNITY): Payer: PPO

## 2020-02-14 DIAGNOSIS — Z20822 Contact with and (suspected) exposure to covid-19: Secondary | ICD-10-CM | POA: Diagnosis present

## 2020-02-14 DIAGNOSIS — J189 Pneumonia, unspecified organism: Secondary | ICD-10-CM

## 2020-02-14 DIAGNOSIS — K2950 Unspecified chronic gastritis without bleeding: Secondary | ICD-10-CM | POA: Diagnosis present

## 2020-02-14 DIAGNOSIS — J449 Chronic obstructive pulmonary disease, unspecified: Secondary | ICD-10-CM | POA: Diagnosis not present

## 2020-02-14 DIAGNOSIS — Z681 Body mass index (BMI) 19 or less, adult: Secondary | ICD-10-CM | POA: Diagnosis not present

## 2020-02-14 DIAGNOSIS — E43 Unspecified severe protein-calorie malnutrition: Secondary | ICD-10-CM | POA: Diagnosis not present

## 2020-02-14 DIAGNOSIS — E876 Hypokalemia: Secondary | ICD-10-CM | POA: Diagnosis not present

## 2020-02-14 DIAGNOSIS — K3189 Other diseases of stomach and duodenum: Secondary | ICD-10-CM | POA: Diagnosis not present

## 2020-02-14 DIAGNOSIS — K31819 Angiodysplasia of stomach and duodenum without bleeding: Secondary | ICD-10-CM | POA: Diagnosis present

## 2020-02-14 DIAGNOSIS — R112 Nausea with vomiting, unspecified: Secondary | ICD-10-CM | POA: Diagnosis not present

## 2020-02-14 DIAGNOSIS — I2510 Atherosclerotic heart disease of native coronary artery without angina pectoris: Secondary | ICD-10-CM | POA: Diagnosis present

## 2020-02-14 DIAGNOSIS — R634 Abnormal weight loss: Secondary | ICD-10-CM | POA: Diagnosis not present

## 2020-02-14 DIAGNOSIS — I252 Old myocardial infarction: Secondary | ICD-10-CM

## 2020-02-14 DIAGNOSIS — R0602 Shortness of breath: Secondary | ICD-10-CM | POA: Diagnosis not present

## 2020-02-14 DIAGNOSIS — D649 Anemia, unspecified: Secondary | ICD-10-CM

## 2020-02-14 DIAGNOSIS — F17210 Nicotine dependence, cigarettes, uncomplicated: Secondary | ICD-10-CM | POA: Diagnosis not present

## 2020-02-14 DIAGNOSIS — E559 Vitamin D deficiency, unspecified: Secondary | ICD-10-CM | POA: Diagnosis not present

## 2020-02-14 DIAGNOSIS — E162 Hypoglycemia, unspecified: Secondary | ICD-10-CM | POA: Diagnosis present

## 2020-02-14 DIAGNOSIS — D509 Iron deficiency anemia, unspecified: Secondary | ICD-10-CM | POA: Diagnosis not present

## 2020-02-14 DIAGNOSIS — Y900 Blood alcohol level of less than 20 mg/100 ml: Secondary | ICD-10-CM | POA: Diagnosis present

## 2020-02-14 DIAGNOSIS — F1010 Alcohol abuse, uncomplicated: Secondary | ICD-10-CM | POA: Diagnosis not present

## 2020-02-14 DIAGNOSIS — R64 Cachexia: Secondary | ICD-10-CM | POA: Diagnosis present

## 2020-02-14 DIAGNOSIS — D500 Iron deficiency anemia secondary to blood loss (chronic): Principal | ICD-10-CM | POA: Diagnosis present

## 2020-02-14 DIAGNOSIS — J440 Chronic obstructive pulmonary disease with acute lower respiratory infection: Secondary | ICD-10-CM | POA: Diagnosis not present

## 2020-02-14 DIAGNOSIS — R1110 Vomiting, unspecified: Secondary | ICD-10-CM | POA: Diagnosis not present

## 2020-02-14 LAB — COMPREHENSIVE METABOLIC PANEL
ALT: 13 U/L (ref 0–44)
AST: 22 U/L (ref 15–41)
Albumin: 1.9 g/dL — ABNORMAL LOW (ref 3.5–5.0)
Alkaline Phosphatase: 82 U/L (ref 38–126)
Anion gap: 8 (ref 5–15)
BUN: 18 mg/dL (ref 8–23)
CO2: 19 mmol/L — ABNORMAL LOW (ref 22–32)
Calcium: 7.8 mg/dL — ABNORMAL LOW (ref 8.9–10.3)
Chloride: 108 mmol/L (ref 98–111)
Creatinine, Ser: 0.99 mg/dL (ref 0.61–1.24)
GFR calc Af Amer: >60 mL/min (ref >60)
GFR calc non Af Amer: >60 mL/min (ref >60)
Glucose, Bld: 98 mg/dL (ref 70–99)
Potassium: 4.3 mmol/L (ref 3.5–5.1)
Sodium: 135 mmol/L (ref 135–145)
Total Bilirubin: 0.5 mg/dL (ref 0.3–1.2)
Total Protein: 5.5 g/dL — ABNORMAL LOW (ref 6.5–8.1)

## 2020-02-14 LAB — IRON AND TIBC
Iron: <5 ug/dL — ABNORMAL LOW (ref 45–182)
Saturation Ratios: 1 % — ABNORMAL LOW (ref 17.9–39.5)
TIBC: 323 ug/dL (ref 250–450)
UIBC: 319 ug/dL

## 2020-02-14 LAB — CBC WITH DIFFERENTIAL/PLATELET
Abs Immature Granulocytes: 0.12 10*3/uL — ABNORMAL HIGH (ref 0.00–0.07)
Basophils Absolute: 0 10*3/uL (ref 0.0–0.1)
Basophils Relative: 0 %
Eosinophils Absolute: 0 10*3/uL (ref 0.0–0.5)
Eosinophils Relative: 0 %
HCT: 13.3 % — ABNORMAL LOW (ref 39.0–52.0)
Hemoglobin: 3.7 g/dL — CL (ref 13.0–17.0)
Immature Granulocytes: 1 %
Lymphocytes Relative: 5 %
Lymphs Abs: 1 10*3/uL (ref 0.7–4.0)
MCH: 19.5 pg — ABNORMAL LOW (ref 26.0–34.0)
MCHC: 27.8 g/dL — ABNORMAL LOW (ref 30.0–36.0)
MCV: 70 fL — ABNORMAL LOW (ref 80.0–100.0)
Monocytes Absolute: 0.5 10*3/uL (ref 0.1–1.0)
Monocytes Relative: 2 %
Neutro Abs: 18.7 10*3/uL — ABNORMAL HIGH (ref 1.7–7.7)
Neutrophils Relative %: 92 %
Platelets: 276 10*3/uL (ref 150–400)
RBC: 1.9 MIL/uL — ABNORMAL LOW (ref 4.22–5.81)
RDW: 21.9 % — ABNORMAL HIGH (ref 11.5–15.5)
WBC Morphology: INCREASED
WBC: 20.3 10*3/uL — ABNORMAL HIGH (ref 4.0–10.5)
nRBC: 0.2 % (ref 0.0–0.2)

## 2020-02-14 LAB — C-REACTIVE PROTEIN: CRP: 7.8 mg/dL — ABNORMAL HIGH (ref <1.0)

## 2020-02-14 LAB — TROPONIN I (HIGH SENSITIVITY)
Troponin I (High Sensitivity): 76 ng/L — ABNORMAL HIGH (ref <18)
Troponin I (High Sensitivity): 80 ng/L — ABNORMAL HIGH (ref <18)

## 2020-02-14 LAB — SEDIMENTATION RATE: Sed Rate: 40 mm/hr — ABNORMAL HIGH (ref 0–16)

## 2020-02-14 LAB — PREPARE RBC (CROSSMATCH)

## 2020-02-14 LAB — VITAMIN B12: Vitamin B-12: 211 pg/mL (ref 180–914)

## 2020-02-14 LAB — APTT: aPTT: 37 seconds — ABNORMAL HIGH (ref 24–36)

## 2020-02-14 LAB — POC OCCULT BLOOD, ED: Fecal Occult Bld: NEGATIVE

## 2020-02-14 LAB — PROTIME-INR
INR: 1.3 — ABNORMAL HIGH (ref 0.8–1.2)
Prothrombin Time: 16.1 seconds — ABNORMAL HIGH (ref 11.4–15.2)

## 2020-02-14 LAB — POC SARS CORONAVIRUS 2 AG -  ED: SARS Coronavirus 2 Ag: NEGATIVE

## 2020-02-14 LAB — RETICULOCYTES
Immature Retic Fract: 36.3 % — ABNORMAL HIGH (ref 2.3–15.9)
RBC.: 1.93 MIL/uL — ABNORMAL LOW (ref 4.22–5.81)
Retic Count, Absolute: 33 10*3/uL (ref 19.0–186.0)
Retic Ct Pct: 1.7 % (ref 0.4–3.1)

## 2020-02-14 LAB — SALICYLATE LEVEL: Salicylate Lvl: <7 mg/dL — ABNORMAL LOW (ref 7.0–30.0)

## 2020-02-14 LAB — ACETAMINOPHEN LEVEL: Acetaminophen (Tylenol), Serum: <10 ug/mL — ABNORMAL LOW (ref 10–30)

## 2020-02-14 LAB — FERRITIN: Ferritin: 12 ng/mL — ABNORMAL LOW (ref 24–336)

## 2020-02-14 LAB — ABO/RH: ABO/RH(D): A POS

## 2020-02-14 LAB — FOLATE: Folate: 4.9 ng/mL — ABNORMAL LOW (ref >5.9)

## 2020-02-14 LAB — ETHANOL: Alcohol, Ethyl (B): <10 mg/dL (ref <10)

## 2020-02-14 LAB — LACTIC ACID, PLASMA: Lactic Acid, Venous: 4.2 mmol/L (ref 0.5–1.9)

## 2020-02-14 MED ORDER — SODIUM CHLORIDE 0.9 % IV SOLN
10.0000 mL/h | Freq: Once | INTRAVENOUS | Status: AC
  Administered 2020-02-15: 10 mL/h via INTRAVENOUS

## 2020-02-14 MED ORDER — SODIUM CHLORIDE 0.9 % IV BOLUS
1000.0000 mL | Freq: Once | INTRAVENOUS | Status: AC
  Administered 2020-02-14: 1000 mL via INTRAVENOUS

## 2020-02-14 MED ORDER — SODIUM CHLORIDE 0.9 % IV SOLN
1.0000 g | Freq: Once | INTRAVENOUS | Status: AC
  Administered 2020-02-14: 1 g via INTRAVENOUS
  Filled 2020-02-14: qty 10

## 2020-02-14 MED ORDER — ALBUTEROL SULFATE HFA 108 (90 BASE) MCG/ACT IN AERS
1.0000 | INHALATION_SPRAY | Freq: Once | RESPIRATORY_TRACT | Status: AC
  Administered 2020-02-14: 2 via RESPIRATORY_TRACT
  Filled 2020-02-14: qty 6.7

## 2020-02-14 MED ORDER — SODIUM CHLORIDE 0.9 % IV SOLN
500.0000 mg | Freq: Once | INTRAVENOUS | Status: AC
  Administered 2020-02-15: 500 mg via INTRAVENOUS
  Filled 2020-02-14: qty 500

## 2020-02-14 MED ORDER — AEROCHAMBER PLUS FLO-VU MEDIUM MISC
1.0000 | Freq: Once | Status: AC
  Administered 2020-02-14: 1
  Filled 2020-02-14 (×2): qty 1

## 2020-02-14 NOTE — ED Notes (Addendum)
Date and time results received: 02/14/20 9:27 PM Test: hemaglobin Critical Value: 3.7  Name of Provider Notified: sam, PA  Orders Received? Or Actions Taken?: n/a

## 2020-02-14 NOTE — ED Triage Notes (Signed)
Patient complains of shortness of breath that began a week ago. Patient does not use oxygen at home.

## 2020-02-14 NOTE — H&P (Signed)
TRH H&P    Patient Demographics:    Spencer Hickman, is a 64 y.o. male  MRN: [id number]  DOB - 1956-10-19  Admit Date - 02/14/2020  Referring MD/NP/PA: Kennith Maes  Outpatient Primary MD for the patient is Patient, No Pcp Per  Patient coming from: Home  Chief complaint-headache, chest pain, shortness of breath   HPI:    Spencer Hickman  is a 64 y.o. male, with a history of COPD, CAD, current use, tobacco abuse, chronic anemia, came to hospital with intermittent complaints of headache with chest pain for past 3 weeks.  He also has been having difficulty with breathing.  He denies specific head trauma.  Patient drinks 6 pack of beer per day.  He denies fever, chills, no visual disturbance, no numbness or weakness.  Also complains of intermittent chest pain with shortness of breath. In the ED, lab work revealed hemoglobin of 3.7, his previous hemoglobin was 6.4 on 02/06/2019. Chest x-ray revealed left midlung infiltrate. Patient started on ceftriaxone and Zithromax.    Review of systems:    In addition to the HPI above,   All other systems reviewed and are negative.    Past History of the following :    Past Medical History:  Diagnosis Date  . Alcohol abuse   . COPD (chronic obstructive pulmonary disease) ([place])   . Coronary artery disease    States "had a mild heart attack years ago.  . Tobacco abuse 02/05/2019      Past Surgical History:  Procedure Laterality Date  . ABDOMINAL SURGERY     from [place] as[place] child      Social History:      Social History   Tobacco Use  . Smoking status: Current Every Day Smoker    Packs/day: 2.00    Years: 25.00    Pack years: 50.00    Types: Cigarettes  . Smokeless tobacco: Never Used  Substance Use Topics  . Alcohol use: Yes    Comment: 6-12 beers daily       Family History :     Family History  Problem Relation Age of Onset  . Cancer  Mother   . Stomach cancer Mother   . Brain cancer Father   . Cancer Sister   . Cancer Brother   . Colon cancer Neg Hx   . Colon polyps Neg Hx       Home Medications:   Prior to Admission medications   Not on File     Allergies:    No Known Allergies   Physical Exam:   Vitals  Blood pressure (!) 90/58, pulse 97, temperature 98.5 F (36.9 C), temperature source Rectal, resp. rate 15, SpO2 100 %.  1.  General: Appears in no acute distress  2. Psychiatric: Alert, oriented x3, intact insight and judgment  3. Neurologic: Cranial nerves II through XII grossly intact, no focal deficit noted  4. HEENMT:  Atraumatic normocephalic, extraocular muscles are intact  5. Respiratory : Clear to auscultation bilaterally, no wheezing or crackles auscultated  6. Cardiovascular :  S1-S2, regular, no murmur auscultated  7. Gastrointestinal:  Abdomen is soft, nontender, no organomegaly      Data Review:    CBC Recent Labs  Lab 02/14/20 2031  WBC 20.3*  HGB 3.7*  HCT 13.3*  PLT 276  MCV 70.0*  MCH 19.5*  MCHC 27.8*  RDW 21.9*  LYMPHSABS 1.0  MONOABS 0.5  EOSABS 0.0  BASOSABS 0.0   ------------------------------------------------------------------------------------------------------------------  Results for orders placed or performed during the hospital encounter of 02/14/20 (from the past 48 hour(s))  Comprehensive metabolic panel     Status: Abnormal   Collection Time: 02/14/20  8:31 PM  Result Value Ref Range   Sodium 135 135 - 145 mmol/L   Potassium 4.3 3.5 - 5.1 mmol/L   Chloride 108 98 - 111 mmol/L   CO2 19 (L) 22 - 32 mmol/L   Glucose, Bld 98 70 - 99 mg/dL    Comment: Glucose reference range applies only to samples taken after fasting for at least 8 hours.   BUN 18 8 - 23 mg/dL   Creatinine, Ser 0.99 0.61 - 1.24 mg/dL   Calcium 7.8 (L) 8.9 - 10.3 mg/dL   Total Protein 5.5 (L) 6.5 - 8.1 g/dL   Albumin 1.9 (L) 3.5 - 5.0 g/dL   AST 22 15 - 41 U/L    ALT 13 0 - 44 U/L   Alkaline Phosphatase 82 38 - 126 U/L   Total Bilirubin 0.5 0.3 - 1.2 mg/dL   GFR calc non Af Amer >60 >60 mL/min   GFR calc Af Amer >60 >60 mL/min   Anion gap 8 5 - 15    Comment: Performed at [facility], [address], [place], [place] [address]  CBC with Differential     Status: Abnormal   Collection Time: 02/14/20  8:31 PM  Result Value Ref Range   WBC 20.3 (H) 4.0 - 10.5 K/uL   RBC 1.90 (L) 4.22 - 5.81 MIL/uL   Hemoglobin 3.7 (LL) 13.0 - 17.0 g/dL    Comment: Reticulocyte Hemoglobin testing may be clinically indicated, consider ordering this additional test [id number] THIS CRITICAL RESULT HAS VERIFIED AND BEEN CALLED TO B MYRICK,RN BY MARIE KELLY ON 03 04 2021 AT 2128, AND HAS BEEN READ BACK.  THIS CRITICAL RESULT HAS VERIFIED AND BEEN CALLED TO B MYRICK,RN BY MARIE [place] ON 03 04 2021 AT 2128, AND HAS BEEN READ BACK.     HCT 13.3 (L) 39.0 - 52.0 %   MCV 70.0 (L) 80.0 - 100.0 fL   MCH 19.5 (L) 26.0 - 34.0 pg   MCHC 27.8 (L) 30.0 - 36.0 g/dL   RDW 21.9 (H) 11.5 - 15.5 %   Platelets 276 150 - 400 K/uL   nRBC 0.2 0.0 - 0.2 %   Neutrophils Relative % 92 %   Neutro Abs 18.7 (H) 1.7 - 7.7 K/uL   Lymphocytes Relative 5 %   Lymphs Abs 1.0 0.7 - 4.0 K/uL   Monocytes Relative 2 %   Monocytes Absolute 0.5 0.1 - 1.0 K/uL   Eosinophils Relative 0 %   Eosinophils Absolute 0.0 0.0 - 0.5 K/uL   Basophils Relative 0 %   Basophils Absolute 0.0 0.0 - 0.1 K/uL   WBC Morphology INCREASED BANDS (>20% BANDS)     Comment: TOXIC GRANULATION   Immature Granulocytes 1 %   Abs Immature Granulocytes 0.12 (H) 0.00 - 0.07 K/uL   Polychromasia PRESENT    Target Cells PRESENT     Comment:  Performed at [facility], [address], [place], [place] [address]  Troponin I (High Sensitivity)     Status: Abnormal   Collection Time: 02/14/20  8:31 PM  Result Value Ref Range   Troponin I (High Sensitivity) 76 (H) <18 ng/L    Comment: (NOTE) Elevated high sensitivity troponin I (hsTnI)  values and significant  changes across serial measurements may suggest ACS but many other  chronic and acute conditions are known to elevate hsTnI results.  Refer to the "Links" section for chest pain algorithms and additional  guidance. Performed at [facility], [address], [place], [place] [address]   Ethanol     Status: None   Collection Time: 02/14/20  8:31 PM  Result Value Ref Range   Alcohol, Ethyl (B) <10 <10 mg/dL    Comment: (NOTE) Lowest detectable limit for serum alcohol is 10 mg/dL. For medical purposes only. Performed at [facility], [address], [place], [place] [address]   Sedimentation rate     Status: Abnormal   Collection Time: 02/14/20  8:31 PM  Result Value Ref Range   Sed Rate 40 (H) 0 - 16 mm/hr    Comment: Performed at [facility], [address] [place], [place] [address]  C-reactive protein     Status: Abnormal   Collection Time: 02/14/20  8:31 PM  Result Value Ref Range   CRP 7.8 (H) <1.0 mg/dL    Comment: Performed at [facility], [address], [place], [place] [address]  Acetaminophen level     Status: Abnormal   Collection Time: 02/14/20  8:31 PM  Result Value Ref Range   Acetaminophen (Tylenol), Serum <10 (L) 10 - 30 ug/mL    Comment: (NOTE) Therapeutic concentrations vary significantly. A range of 10-30 ug/mL  may be an effective concentration for many patients. However, some  are best treated at concentrations outside of this range. Acetaminophen concentrations >150 ug/mL at 4 hours after ingestion  and >50 ug/mL at 12 hours after ingestion are often associated with  toxic reactions. Performed at [facility], [address], [place], [place] [address]   Salicylate level     Status: Abnormal   Collection Time: 02/14/20  8:31 PM  Result Value Ref Range   Salicylate Lvl <2.9 (L) 7.0 - 30.0 mg/dL    Comment: Performed at [facility], [address], [place], [place] [address]  Reticulocytes     Status: Abnormal   Collection Time:  02/14/20  8:31 PM  Result Value Ref Range   Retic Ct Pct 1.7 0.4 - 3.1 %   RBC. 1.93 (L) 4.22 - 5.81 MIL/uL   Retic Count, Absolute 33.0 19.0 - 186.0 K/uL   Immature Retic Fract 36.3 (H) 2.3 - 15.9 %    Comment: Performed at [facility], [address] [place], [place] [address]  POC occult blood, ED     Status: None   Collection Time: 02/14/20  9:36 PM  Result Value Ref Range   Fecal Occult Bld NEGATIVE NEGATIVE  APTT     Status: Abnormal   Collection Time: 02/14/20  9:55 PM  Result Value Ref Range   aPTT 37 (H) 24 - 36 seconds    Comment:        IF BASELINE aPTT IS ELEVATED, SUGGEST PATIENT RISK ASSESSMENT BE USED TO DETERMINE APPROPRIATE ANTICOAGULANT THERAPY. Performed at [facility], [address] [place], [place] [address]   Protime-INR     Status: Abnormal   Collection Time: 02/14/20  9:55 PM  Result Value Ref Range   Prothrombin Time 16.1 (H) 11.4 - 15.2 seconds   INR 1.3 (H) 0.8 - 1.2    Comment: (NOTE) INR goal varies based on device and disease states. Performed at [facility], [address], [place], [place] [address]   POC SARS Coronavirus 2 Ag-ED - Nasal Swab (BD Veritor Kit)     Status: None   Collection Time: 02/14/20 10:18 PM  Result Value Ref Range   SARS Coronavirus 2 Ag NEGATIVE NEGATIVE    Comment: (NOTE) SARS-CoV-2 antigen NOT DETECTED.  Negative results are presumptive.  Negative results do not preclude SARS-CoV-2 infection and should not be used as the sole basis for treatment or other patient management decisions, including infection  control decisions, particularly in the presence of clinical signs and  symptoms consistent with COVID-19, or in those who have been in contact with the virus.  Negative results must be combined with clinical observations, patient history, and epidemiological information. The expected result is Negative. Fact Sheet for Patients: PodPark.tn Fact Sheet for Healthcare  Providers: GiftContent.is This test is not yet approved or cleared by the [place] FDA and  has been authorized for detection and/or diagnosis of SARS-CoV-2 by FDA under an Emergency Use Authorization (EUA).  This EUA will remain in effect (meaning this test can be used) for the duration of  the COVID-19 de claration under Section 564(b)(1) of the Act, 21 U.S.C. section 360bbb-3(b)(1), unless the authorization is terminated or revoked sooner.     Chemistries  Recent Labs  Lab 02/14/20 2031  NA 135  K 4.3  CL 108  CO2 19*  GLUCOSE 98  BUN 18  CREATININE 0.99  CALCIUM 7.8*  AST 22  ALT 13  ALKPHOS 82  BILITOT 0.5   ------------------------------------------------------------------------------------------------------------------  ------------------------------------------------------------------------------------------------------------------ GFR: CrCl cannot be calculated (Unknown ideal weight.). Liver Function Tests: Recent Labs  Lab 02/14/20 2031  AST 22  ALT 13  ALKPHOS 82  BILITOT 0.5  PROT 5.5*  ALBUMIN 1.9*   No results for input(s): LIPASE, AMYLASE in the last 168 hours. No results for input(s): AMMONIA in the last 168 hours. Coagulation Profile: Recent Labs  Lab 02/14/20 2155  INR 1.3*   Cardiac Enzymes: No results for input(s): CKTOTAL, CKMB, CKMBINDEX, TROPONINI in the last 168 hours. BNP (last 3 results) No results for input(s): PROBNP in the last 8760 hours. HbA1C: No results for input(s): HGBA1C in the last 72 hours. CBG: No results for input(s): GLUCAP in the last 168 hours. Lipid Profile: No results for input(s): CHOL, HDL, LDLCALC, TRIG, CHOLHDL, LDLDIRECT in the last 72 hours. Thyroid Function Tests: No results for input(s): TSH, T4TOTAL, FREET4, T3FREE, THYROIDAB in the last 72 hours. Anemia Panel: Recent Labs    02/14/20 2031  RETICCTPCT 1.7     --------------------------------------------------------------------------------------------------------------- Urine analysis:    Component Value Date/Time   COLORURINE YELLOW 02/05/2019 0851   APPEARANCEUR CLEAR 02/05/2019 0851   LABSPEC 1.005 02/05/2019 0851   PHURINE 5.0 02/05/2019 0851   GLUCOSEU NEGATIVE 02/05/2019 0851   HGBUR NEGATIVE 02/05/2019 0851   BILIRUBINUR NEGATIVE 02/05/2019 0851   KETONESUR NEGATIVE 02/05/2019 0851   PROTEINUR NEGATIVE 02/05/2019 0851   NITRITE NEGATIVE 02/05/2019 0851   LEUKOCYTESUR NEGATIVE 02/05/2019 0851      Imaging Results:    DG Chest Port 1 View  Result Date: 02/14/2020 CLINICAL DATA:  Shortness of breath EXAM: PORTABLE CHEST 1 VIEW COMPARISON:  02/05/2019 FINDINGS: Cardiac shadow is within normal limits. The  lungs are hyperinflated. Diffuse alveolar opacity is noted in the left mid lung consistent with acute infiltrate. This is new from the prior exam. No effusion is seen. No bony abnormality is noted. IMPRESSION: Left midlung infiltrate. Electronically Signed   By: Inez Catalina M.D.   On: 02/14/2020 19:56    My personal review of EKG: Rhythm NSR, LAD, no ST changes   Assessment & Plan:    Active Problems:   Symptomatic anemia   1. Symptomatic anemia-patient presenting with symptomatic anemia, hemoglobin significantly lower today at 3.7.  His baseline hemoglobin is around 7.  Stool guaiac done in the ED was negative.  Anemia panel has been obtained.  Review of previous records revealed that patient had severe iron deficiency anemia.  He had declined EGD and colonoscopy during previous admission in February 2020.  We will keep him n.p.o. after midnight, 2 units PRBC have been ordered.  Will consult GI in a.m. for possible EGD/colonoscopy. 2. Community-acquired pneumonia-chest x-ray showed left midlung infiltrate, patient started on ceftriaxone and Zithromax.  We will continue with antibiotics.  Follow blood culture results.  SARS-CoV-2  test obtained, result is pending. 3. History of alcohol abuse-patient drinks 6 packs of beer every day.  We will start him on CIWA protocol.  No signs and symptoms of alcohol withdrawal at this time. 4. Chest pain-likely from severe symptomatic anemia and underlying pneumonia.  Will obtain serial troponin.  EKG is unremarkable. 5. COPD-not in exacerbation.  Continue as needed albuterol inhaler.    DVT Prophylaxis-   SCDs   AM Labs Ordered, also please review Full Orders  Family Communication: Admission, patients condition and plan of care including tests being ordered have been discussed with the patient  who indicate understanding and agree with the plan and Code Status.  Code Status: Full code  Admission status: Inpatient :The appropriate admission status for this patient is INPATIENT. Inpatient status is judged to be reasonable and necessary in order to provide the required intensity of service to ensure the patient's safety. The patient's presenting symptoms, physical exam findings, and initial radiographic and laboratory data in the context of their chronic comorbidities is felt to place them at high risk for further clinical deterioration. Furthermore, it is not anticipated that the patient will be medically stable for discharge from the hospital within 2 midnights of admission. The following factors support the admission status of inpatient.     The patient's presenting symptoms include .  Headache, chest pain, shortness of breath The worrisome physical exam findings include cachexia. The initial radiographic and laboratory data are worrisome because of severe anemia, hemoglobin 3.7. The chronic co-morbidities include alcohol abuse, COPD.       * I certify that at the point of admission it is my clinical judgment that the patient will require inpatient hospital care spanning beyond 2 midnights from the point of admission due to high intensity of service, high risk for further  deterioration and high frequency of surveillance required.*  Time spent in minutes : 60 minutes   Courtlyn Aki S Kelsi Benham M.D

## 2020-02-14 NOTE — ED Notes (Signed)
Pt gave permission on speaking with niece for update.
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
## 2020-02-15 ENCOUNTER — Inpatient Hospital Stay (HOSPITAL_COMMUNITY): Payer: PPO

## 2020-02-15 ENCOUNTER — Encounter (HOSPITAL_COMMUNITY): Payer: Self-pay | Admitting: Family Medicine

## 2020-02-15 ENCOUNTER — Other Ambulatory Visit: Payer: Self-pay

## 2020-02-15 DIAGNOSIS — R0602 Shortness of breath: Secondary | ICD-10-CM

## 2020-02-15 DIAGNOSIS — E43 Unspecified severe protein-calorie malnutrition: Secondary | ICD-10-CM | POA: Insufficient documentation

## 2020-02-15 DIAGNOSIS — D649 Anemia, unspecified: Secondary | ICD-10-CM

## 2020-02-15 DIAGNOSIS — J189 Pneumonia, unspecified organism: Secondary | ICD-10-CM

## 2020-02-15 LAB — COMPREHENSIVE METABOLIC PANEL
ALT: 13 U/L (ref 0–44)
AST: 21 U/L (ref 15–41)
Albumin: 1.8 g/dL — ABNORMAL LOW (ref 3.5–5.0)
Alkaline Phosphatase: 83 U/L (ref 38–126)
Anion gap: 6 (ref 5–15)
BUN: 15 mg/dL (ref 8–23)
CO2: 22 mmol/L (ref 22–32)
Calcium: 7.7 mg/dL — ABNORMAL LOW (ref 8.9–10.3)
Chloride: 112 mmol/L — ABNORMAL HIGH (ref 98–111)
Creatinine, Ser: 0.79 mg/dL (ref 0.61–1.24)
GFR calc Af Amer: >60 mL/min (ref >60)
GFR calc non Af Amer: >60 mL/min (ref >60)
Glucose, Bld: 92 mg/dL (ref 70–99)
Potassium: 4.1 mmol/L (ref 3.5–5.1)
Sodium: 140 mmol/L (ref 135–145)
Total Bilirubin: 0.7 mg/dL (ref 0.3–1.2)
Total Protein: 5.5 g/dL — ABNORMAL LOW (ref 6.5–8.1)

## 2020-02-15 LAB — CBC
HCT: 22.2 % — ABNORMAL LOW (ref 39.0–52.0)
Hemoglobin: 6.7 g/dL — CL (ref 13.0–17.0)
MCH: 22.9 pg — ABNORMAL LOW (ref 26.0–34.0)
MCHC: 30.2 g/dL (ref 30.0–36.0)
MCV: 75.8 fL — ABNORMAL LOW (ref 80.0–100.0)
Platelets: 244 10*3/uL (ref 150–400)
RBC: 2.93 MIL/uL — ABNORMAL LOW (ref 4.22–5.81)
RDW: 23.7 % — ABNORMAL HIGH (ref 11.5–15.5)
WBC: 23.6 10*3/uL — ABNORMAL HIGH (ref 4.0–10.5)
nRBC: 0.2 % (ref 0.0–0.2)

## 2020-02-15 LAB — VITAMIN D 25 HYDROXY (VIT D DEFICIENCY, FRACTURES): Vit D, 25-Hydroxy: 25.33 ng/mL — ABNORMAL LOW (ref 30–100)

## 2020-02-15 LAB — LACTIC ACID, PLASMA
Lactic Acid, Venous: 1.4 mmol/L (ref 0.5–1.9)
Lactic Acid, Venous: 2.8 mmol/L (ref 0.5–1.9)

## 2020-02-15 LAB — TROPONIN I (HIGH SENSITIVITY)
Troponin I (High Sensitivity): 88 ng/L — ABNORMAL HIGH (ref <18)
Troponin I (High Sensitivity): 139 ng/L (ref <18)

## 2020-02-15 LAB — PHOSPHORUS: Phosphorus: 3.3 mg/dL (ref 2.5–4.6)

## 2020-02-15 LAB — RESPIRATORY PANEL BY RT PCR (FLU A&B, COVID)
Influenza A by PCR: NEGATIVE
Influenza B by PCR: NEGATIVE
SARS Coronavirus 2 by RT PCR: NEGATIVE

## 2020-02-15 LAB — MAGNESIUM: Magnesium: 1.6 mg/dL — ABNORMAL LOW (ref 1.7–2.4)

## 2020-02-15 LAB — HIV ANTIBODY (ROUTINE TESTING W REFLEX): HIV Screen 4th Generation wRfx: NONREACTIVE

## 2020-02-15 LAB — PREALBUMIN: Prealbumin: 8.1 mg/dL — ABNORMAL LOW (ref 18–38)

## 2020-02-15 LAB — MRSA PCR SCREENING: MRSA by PCR: NEGATIVE

## 2020-02-15 LAB — PREPARE RBC (CROSSMATCH)

## 2020-02-15 MED ORDER — THIAMINE HCL 100 MG/ML IJ SOLN: 100.0000 mg | Freq: Every day | INTRAMUSCULAR | Status: DC

## 2020-02-15 MED ORDER — ONDANSETRON HCL 4 MG PO TABS: 4.0000 mg | ORAL_TABLET | Freq: Four times a day (QID) | ORAL | Status: DC | PRN

## 2020-02-15 MED ORDER — FUROSEMIDE 10 MG/ML IJ SOLN
20.0000 mg | Freq: Once | INTRAMUSCULAR | Status: AC
  Administered 2020-02-15: 20 mg via INTRAVENOUS
  Filled 2020-02-15: qty 2

## 2020-02-15 MED ORDER — SODIUM CHLORIDE 0.9 % IV SOLN: 250.0000 mL | INTRAVENOUS | Status: DC | PRN

## 2020-02-15 MED ORDER — SODIUM CHLORIDE 0.9% IV SOLUTION: Freq: Once | INTRAVENOUS | Status: AC

## 2020-02-15 MED ORDER — SODIUM CHLORIDE 0.9 % IV SOLN
1.0000 g | INTRAVENOUS | Status: DC
  Administered 2020-02-15 – 2020-02-18 (×4): 1 g via INTRAVENOUS
  Filled 2020-02-15 (×4): qty 10

## 2020-02-15 MED ORDER — FOLIC ACID 1 MG PO TABS
1.0000 mg | ORAL_TABLET | Freq: Every day | ORAL | Status: DC
  Administered 2020-02-15 – 2020-02-18 (×4): 1 mg via ORAL
  Filled 2020-02-15 (×4): qty 1

## 2020-02-15 MED ORDER — SODIUM CHLORIDE 0.9 % IV SOLN
500.0000 mg | INTRAVENOUS | Status: DC
  Administered 2020-02-15 – 2020-02-17 (×2): 500 mg via INTRAVENOUS
  Filled 2020-02-15 (×2): qty 500

## 2020-02-15 MED ORDER — SODIUM CHLORIDE 0.9% FLUSH: 3.0000 mL | INTRAVENOUS | Status: DC | PRN

## 2020-02-15 MED ORDER — ONDANSETRON HCL 4 MG/2ML IJ SOLN: 4.0000 mg | Freq: Four times a day (QID) | INTRAMUSCULAR | Status: DC | PRN

## 2020-02-15 MED ORDER — SODIUM CHLORIDE 0.9% FLUSH
3.0000 mL | Freq: Two times a day (BID) | INTRAVENOUS | Status: DC
  Administered 2020-02-15 – 2020-02-18 (×8): 3 mL via INTRAVENOUS

## 2020-02-15 MED ORDER — MAGNESIUM SULFATE 2 GM/50ML IV SOLN
2.0000 g | Freq: Once | INTRAVENOUS | Status: AC
  Administered 2020-02-15: 2 g via INTRAVENOUS
  Filled 2020-02-15: qty 50

## 2020-02-15 MED ORDER — NICOTINE 21 MG/24HR TD PT24
21.0000 mg | MEDICATED_PATCH | Freq: Every day | TRANSDERMAL | Status: DC
  Administered 2020-02-15 – 2020-02-18 (×5): 21 mg via TRANSDERMAL
  Filled 2020-02-15 (×5): qty 1

## 2020-02-15 MED ORDER — CHLORHEXIDINE GLUCONATE CLOTH 2 % EX PADS
6.0000 | MEDICATED_PAD | Freq: Every day | CUTANEOUS | Status: DC
  Administered 2020-02-15: 6 via TOPICAL

## 2020-02-15 MED ORDER — NICOTINE 21 MG/24HR TD PT24: 21.0000 mg | MEDICATED_PATCH | Freq: Every day | TRANSDERMAL | Status: DC

## 2020-02-15 MED ORDER — ADULT MULTIVITAMIN W/MINERALS CH
1.0000 | ORAL_TABLET | Freq: Every day | ORAL | Status: DC
  Administered 2020-02-15 – 2020-02-18 (×4): 1 via ORAL
  Filled 2020-02-15 (×4): qty 1

## 2020-02-15 MED ORDER — LORAZEPAM 2 MG/ML IJ SOLN
1.0000 mg | INTRAMUSCULAR | Status: AC | PRN
  Administered 2020-02-15: 2 mg via INTRAVENOUS
  Filled 2020-02-15: qty 1

## 2020-02-15 MED ORDER — THIAMINE HCL 100 MG PO TABS
100.0000 mg | ORAL_TABLET | Freq: Every day | ORAL | Status: DC
  Administered 2020-02-15 – 2020-02-18 (×4): 100 mg via ORAL
  Filled 2020-02-15 (×4): qty 1

## 2020-02-15 MED ORDER — LORAZEPAM 1 MG PO TABS
1.0000 mg | ORAL_TABLET | ORAL | Status: AC | PRN
  Administered 2020-02-16: 1 mg via ORAL
  Filled 2020-02-15: qty 1

## 2020-02-15 MED ORDER — IOHEXOL 300 MG/ML  SOLN
100.0000 mL | Freq: Once | INTRAMUSCULAR | Status: AC | PRN
  Administered 2020-02-15: 100 mL via INTRAVENOUS
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
## 2020-02-15 NOTE — Progress Notes (Signed)
CRITICAL VALUE ALERT  Critical Value:  Hemoglobin 6.7 & Troponin 139  Date & Time Notied:  02/15/20 @ 0800  Provider Notified: Laural Benes, MD.  Orders Received/Actions taken: Awaiting new orders.

## 2020-02-15 NOTE — Consult Note (Addendum)
Referring Provider: [facility] Primary Care Physician:  Patient, No Pcp Per Primary Gastroenterologist:  Dr. Oneida Alar (per last hospitalization)  Date of Admission: 02/14/2020 Date of Consultation: 02/15/2020  Reason for Consultation:  Acute on chronic anemia  HPI:  Spencer Hickman is a 64 y.o. male with a past medical history of COPD, CAD, tobacco abuse, alcohol abuse, chronic anemia.  He presented complaints of headache and chest pain for [redacted] weeks along with dyspnea drinks a sixpack of beer a day.  Has been taking 8 extra strength Tylenol day for his headache/neck pain, denied head trauma.  Persistent cough with thick phlegm and sputum.  No fever or chills.  ER work-up found critical hemoglobin of 3.7.  Also noted lung infiltrates and elevated lactic acid, met criteria for SIRS and was placed on sepsis protocol.  We last saw the patient during his hospitalization in February 2020.  At that time it was noted initial hemoglobin 7.8 but decreased to 6.4.  Noted fatigue for 2 months as well as syncope.  He denied overt GI bleeding at that time.  Poor appetite with significant beer intake (6-12 beers a day for close to 30 years) without history of liver disease.  Previous colonoscopy and EGD completed 15 years prior.  He declined endoscopic evaluation while inpatient but was willing to pursue as an outpatient.  Unfortunately, he did not follow through with outpatient appointment and we have never seen him in the clinic.  Significant laboratory work-up in the ED including CMP found completely normal LFTs.  CBC with leukocytosis at 20.3 and a left shift, critical hemoglobin at 3.7 (baseline typically 7-8).  Ferritin low at 12, iron <5, saturation of 1%.  Folate low at 4.9, B12 normal.  INR 1.3.  Ethanol, acetaminophen, salicylate levels all negative.  Blood cultures in process.  Sed rate and CRP both elevated at 40/7.8.  POC SARS-CoV-2 negative, nasopharyngeal swab pending (PCR negative on respiratory  panel).  Lactic acid yesterday 4.2 which improved to 2.8 (still elevated) today.  HIV pending.  Of note rectal exam in the ED found brown stool which was heme-negative.  CT of the abdomen and pelvis was unremarkable from an abdominal standpoint.  Today he states he has been having his symptoms over the past 3 weeks, felt like he was in good health before then. About 3 weeks ago began having significant headaches, shortness of breath, dizziness. Denies syncope. Denies hematochezia or melena. Last colonoscopy and EGD "more than 15 years ago." Is now worried he may have colon cancer. Denies any abdominal pain, N/V. No other GI complaints.  Past Medical History:  Diagnosis Date  . Alcohol abuse   . COPD (chronic obstructive pulmonary disease) ([place])   . Coronary artery disease    States "had a mild heart attack years ago.  . Tobacco abuse 02/05/2019    Past Surgical History:  Procedure Laterality Date  . ABDOMINAL SURGERY     from [place] child    Prior to Admission medications   Not on File    Current Facility-Administered Medications  Medication Dose Route Frequency Provider Last Rate Last Admin  . 0.9 %  sodium chloride infusion  250 mL Intravenous PRN Oswald Hillock, MD      . Derrill Memo ON 02/16/2020] azithromycin (ZITHROMAX) 500 mg in sodium chloride 0.9 % 250 mL IVPB  500 mg Intravenous Q24H [place], Gagan S, MD      . cefTRIAXone (ROCEPHIN) 1 g in sodium chloride 0.9 % 100 mL  IVPB  1 g Intravenous Q24H [place], Marge Duncans, MD      . folic acid (FOLVITE) tablet 1 mg  1 mg Oral Daily Darrick Meigs, Marge Duncans, MD      . LORazepam (ATIVAN) tablet 1-4 mg  1-4 mg Oral Q1H PRN Oswald Hillock, MD       Or  . LORazepam (ATIVAN) injection 1-4 mg  1-4 mg Intravenous Q1H PRN Oswald Hillock, MD   2 mg at 02/15/20 4944  . multivitamin with minerals tablet 1 tablet  1 tablet Oral Daily [place], Marge Duncans, MD      . nicotine (NICODERM CQ - dosed in mg/24 hours) patch 21 mg  21 mg Transdermal Daily Oswald Hillock, MD   21 mg at  02/15/20 0402  . ondansetron (ZOFRAN) tablet 4 mg  4 mg Oral Q6H PRN Oswald Hillock, MD       Or  . ondansetron (ZOFRAN) injection 4 mg  4 mg Intravenous Q6H PRN Oswald Hillock, MD      . sodium chloride flush (NS) 0.9 % injection 3 mL  3 mL Intravenous Q12H Oswald Hillock, MD   3 mL at 02/15/20 0331  . sodium chloride flush (NS) 0.9 % injection 3 mL  3 mL Intravenous PRN [place], Marge Duncans, MD      . thiamine tablet 100 mg  100 mg Oral Daily Darrick Meigs, Marge Duncans, MD       Or  . thiamine (B-1) injection 100 mg  100 mg Intravenous Daily Oswald Hillock, MD        Allergies as of 02/14/2020  . (No Known Allergies)    Family History  Problem Relation Age of Onset  . Cancer Mother   . Stomach cancer Mother   . Brain cancer Father   . Cancer Sister   . Cancer Brother   . Colon cancer Neg Hx   . Colon polyps Neg Hx     Social History   Socioeconomic History  . Marital status: Legally Separated    Spouse name: Not on file  . Number of children: Not on file  . Years of education: Not on file  . Highest education level: Not on file  Occupational History  . Not on file  Tobacco Use  . Smoking status: Current Every Day Smoker    Packs/day: 2.00    Years: 25.00    Pack years: 50.00    Types: Cigarettes  . Smokeless tobacco: Never Used  Substance and Sexual Activity  . Alcohol use: Yes    Comment: 6-12 beers daily  . Drug use: Yes    Types: Marijuana  . Sexual activity: Not on file  Other Topics Concern  . Not on file  Social History Narrative  . Not on file   Social Determinants of Health   Financial Resource Strain:   . Difficulty of Paying Living Expenses: Not on file  Food Insecurity:   . Worried About Charity fundraiser in the Last Year: Not on file  . Ran Out of Food in the Last Year: Not on file  Transportation Needs:   . Lack of Transportation (Medical): Not on file  . Lack of Transportation (Non-Medical): Not on file  Physical Activity:   . Days of Exercise per Week: Not  on file  . Minutes of Exercise per Session: Not on file  Stress:   . Feeling of Stress : Not on file  Social Connections:   .  Frequency of Communication with Friends and Family: Not on file  . Frequency of Social Gatherings with Friends and Family: Not on file  . Attends Religious Services: Not on file  . Active Member of Clubs or Organizations: Not on file  . Attends Archivist Meetings: Not on file  . Marital Status: Not on file  Intimate Partner Violence:   . Fear of Current or Ex-Partner: Not on file  . Emotionally Abused: Not on file  . Physically Abused: Not on file  . Sexually Abused: Not on file    Review of Systems: General: Negative for anorexia, weight loss, fever, chills, weakness. ENT: Negative for hoarseness, difficulty swallowing. CV: Negative for chest pain, angina, palpitations, peripheral edema.  Respiratory: Negative for dyspnea at rest. Admits productive cough with thick sputum. Denies wheezing.  GI: See history of present illness. Derm: Negative for rash or itching.  Endo: Negative for unusual weight change.  Heme: Negative for bruising or bleeding. Allergy: Negative for rash or hives.  Physical Exam: Vital signs in last 24 hours: Temp:  [97.5 F (36.4 C)-99.1 F (37.3 C)] 97.7 F (36.5 C) (03/05 0500) Pulse Rate:  [53-154] 94 (03/05 0500) Resp:  [11-58] 19 (03/05 0500) BP: (80-120)/(47-73) 87/50 (03/05 0500) SpO2:  [77 %-100 %] 99 % (03/05 0500) Last BM Date: 02/14/20 General:   Alert,  Well-developed pleasant and cooperative in NAD. Appears ill and cachectic. Head:  Normocephalic and atraumatic. Eyes:  Sclera clear, no icterus. Conjunctiva pink. Ears:  Normal auditory acuity. Neck:  Supple; no masses or thyromegaly. Lungs:  Clear throughout to auscultation.   No wheezes, crackles, or rhonchi. No acute distress. Heart:  Regular rate and rhythm; no murmurs, clicks, rubs,  or gallops. Abdomen:  Soft, nontender and nondistended. No masses,  hepatosplenomegaly or hernias noted. Normal bowel sounds, without guarding, and without rebound.   Rectal:  Deferred.   Msk:  Symmetrical without gross deformities. Pulses:  Normal bilateral DP pulses noted. Extremities:  Without clubbing or edema. Neurologic:  Alert and  oriented x4;  grossly normal neurologically. Psych:  Alert and cooperative. Normal mood and affect.  Intake/Output from previous day: 03/04 0701 - 03/05 0700 In: 870.4 [I.V.:560.4; Blood:310] Out: -  Intake/Output this shift: No intake/output data recorded.  Lab Results: Recent Labs    02/14/20 2031  WBC 20.3*  HGB 3.7*  HCT 13.3*  PLT 276   BMET Recent Labs    02/14/20 2031  NA 135  K 4.3  CL 108  CO2 19*  GLUCOSE 98  BUN 18  CREATININE 0.99  CALCIUM 7.8*   LFT Recent Labs    02/14/20 2031  PROT 5.5*  ALBUMIN 1.9*  AST 22  ALT 13  ALKPHOS 82  BILITOT 0.5   PT/INR Recent Labs    02/14/20 2155  LABPROT 16.1*  INR 1.3*   Hepatitis Panel No results for input(s): HEPBSAG, HCVAB, HEPAIGM, HEPBIGM in the last 72 hours. C-Diff No results for input(s): CDIFFTOX in the last 72 hours.  Studies/Results: CT ABDOMEN PELVIS W CONTRAST  Result Date: 02/15/2020 CLINICAL DATA:  Nausea and vomiting and anemia EXAM: CT ABDOMEN AND PELVIS WITH CONTRAST TECHNIQUE: Multidetector CT imaging of the abdomen and pelvis was performed using the standard protocol following bolus administration of intravenous contrast. CONTRAST:  152m OMNIPAQUE IOHEXOL 300 MG/ML  SOLN COMPARISON:  None. FINDINGS: Lower chest: Lung bases demonstrate small left pleural effusion and bibasilar dependent atelectatic changes. Hepatobiliary: No focal liver abnormality is seen. No gallstones, gallbladder  wall thickening, or biliary dilatation. Pancreas: Unremarkable. No pancreatic ductal dilatation or surrounding inflammatory changes. Spleen: Normal in size without focal abnormality. Adrenals/Urinary Tract: Adrenal glands are within normal  limits. Kidneys demonstrate a normal enhancement pattern. No renal calculi or obstructive changes are seen. Normal excretion of contrast material is noted on delayed images. Extrarenal pelvis is noted on the left. The bladder is well distended. Stomach/Bowel: Colon is well visualized and shows no obstructive or inflammatory changes. The appendix is within normal limits. The small bowel demonstrates scattered fluid although no obstructive changes are seen. Vascular/Lymphatic: Aortic atherosclerosis. No enlarged abdominal or pelvic lymph nodes. Reproductive: Prostate is unremarkable. Other: No abdominal wall hernia or abnormality. No abdominopelvic ascites. Musculoskeletal: No acute or significant osseous findings. IMPRESSION: Small left pleural effusion and bibasilar dependent atelectatic changes. No other focal abnormality is noted. Electronically Signed   By: Inez Catalina M.D.   On: 02/15/2020 02:16   DG Chest Port 1 View  Result Date: 02/14/2020 CLINICAL DATA:  Shortness of breath EXAM: PORTABLE CHEST 1 VIEW COMPARISON:  02/05/2019 FINDINGS: Cardiac shadow is within normal limits. The lungs are hyperinflated. Diffuse alveolar opacity is noted in the left mid lung consistent with acute infiltrate. This is new from the prior exam. No effusion is seen. No bony abnormality is noted. IMPRESSION: Left midlung infiltrate. Electronically Signed   By: Inez Catalina M.D.   On: 02/14/2020 19:56    Impression: Pleasant 64 year old male with a history of COPD and chronic alcohol abuse presented with persistent headache, chest pain, dyspnea, weakness.  He was previously seen in the hospital 1 year ago by [place] for acute on chronic anemia and recommended outpatient endoscopic evaluation with colonoscopy and EGD due to patient refusal for inpatient procedures.  He again presents today with critical acute on chronic anemia with a hemoglobin on admission of 3.7.  After transfusion this is improved to 6.7 today.  No colonoscopy  or endoscopy in the past 15 or more years.  Heme-negative on exam in the emergency room.  Admitted with sepsis/SIRS likely due to pneumonia currently on antibiotics.  Acute on chronic anemia - although he has not noted any hematochezia or melena and his stool was heme-negative, I am concerned about possible colon cancer given his profound anemia and no recent endoscopic evaluation.  It was unfortunate that he missed his appointment for outpatient evaluation after his last hospital discharge.  He denies any abdominal pain.  No obvious GI bleed.  However, given his significant anemia I feel he would benefit from EGD and colonoscopy this admission.  He is agreeable at this point.  However, he would need to have improvement in his hemoglobin and his breathing status/sepsis prior to endoscopic evaluation.  Would likely need propofol given chronic alcohol use.  There is plans for transfusing 1-2 more units of blood today by hospitalist service.  Sepsis - likely pneumonia, worsening leukocytosis with white blood cell count today of 23.6 despite fluid resuscitation yesterday.  His lungs sound quite poor today, although he is satting 100% on the monitor.  Further management per hospitalist.  Will likely need improvement in his status prior to endoscopic evaluation.  Plan: 1. Transfuse as necessary with goal hemoglobin between 7 and 8 2. Sepsis management per hospitalist 3. Electrolyte management per hospitalist 4. Supportive measures 5. We will continue to follow along with you, will consider endoscopic evaluation likely early next week   Thank you for allowing [place] to participate in the care of [employer]  Walden Field, DNP, AGNP-C Adult & Gerontological Nurse Practitioner [facility]   LOS: 1 day     02/15/2020, 7:42 AM

## 2020-02-15 NOTE — Progress Notes (Signed)
PROGRESS NOTE [facility]   Spencer Hickman  JJH:[id number]  DOB: 1956/05/21  DOA: 02/14/2020 PCP: Patient, No Pcp Per   Brief Admission Hx: 64 year old male with COPD, CAD, chronic tobacco abuse and chronic alcohol abuse anemia presented to the emergency department with chest pain symptoms for 3 weeks.  He had recently been discharged from the hospital last month [place] and now returns with severe anemia with a hemoglobin of 3.7.  MDM/Assessment & Plan:   1. Severe symptomatic anemia-secondary to chronic blood loss.  He has been transfused 2 units packed red blood cells and an additional 2 units has been ordered today.  Follow hemoglobin closely.  Patient had recently declined EGD and colonoscopy but seems agreeable at this time.  I have consulted GI for evaluation and consideration when he is more medically stable for procedures. 2. Community-acquired pneumonia-his SARS 2 Covid test has been negative.  He started on ceftriaxone and azithromycin for community-acquired pneumonia.  Continue supportive care.  Follow cultures. 3. History of chronic alcohol abuse-high risk for acute alcohol withdrawal he has been placed on CIWA protocol and vitamin supplementation as ordered.  Continue to follow. 4. Atypical chest pain-symptoms secondary to severe anemia and seem to have resolved with transfusions.  Continue to monitor.  He is on a telemetry monitor and his EKG did not show any acute findings do not suspect ACS. 5. Chronic COPD-resume home bronchodilators. 6. Abnormal weight loss-patient has been seen by the dietitian and supplementation ordered for diet. 7. Severe protein calorie malnutrition-supplements as ordered.  DVT prophylaxis: SCD Code Status: Full Family Communication: Patient updated at bedside Disposition Plan: From home, presents with severe anemia requiring multiple packed red blood cell transfusions and subspecialty consultation with GI and planning for an  patient procedures when medically stabilized   Consultants:  GI  Subjective: Patient without specific complaints this morning  Objective: Vitals:   02/15/20 1500 02/15/20 1515 02/15/20 1530 02/15/20 1600  BP: 98/63 109/62 101/73   Pulse: (!) 38     Resp: (!) 24 (!) 23 19   Temp: 97.8 F (36.6 C) 98 F (36.7 C)  98.1 F (36.7 C)  TempSrc: Oral Oral  Oral  SpO2:  98%    Weight:        Intake/Output Summary (Last 24 hours) at 02/15/2020 1717 Last data filed at 02/15/2020 1500 Gross per 24 hour  Intake 1383.32 ml  Output 1050 ml  Net 333.32 ml   Filed Weights   02/15/20 1444  Weight: 63.8 kg    REVIEW OF SYSTEMS  As per history otherwise all reviewed and reported negative  Exam:  General exam: Thin frail emaciated male chronically ill-appearing lying in bed he is arousable but appears somnolent Respiratory system:  No increased work of breathing. Cardiovascular system: Normal S1 & S2 heard.  Gastrointestinal system: Abdomen is nondistended, soft and nontender. Normal bowel sounds heard. Central nervous system: Alert and oriented. No focal neurological deficits. Extremities: no CCE.  Data Reviewed: Basic Metabolic Panel: Recent Labs  Lab 02/14/20 2031 02/14/20 2354 02/15/20 0708  NA 135  --  140  K 4.3  --  4.1  CL 108  --  112*  CO2 19*  --  22  GLUCOSE 98  --  92  BUN 18  --  15  CREATININE 0.99  --  0.79  CALCIUM 7.8*  --  7.7*  MG  --  1.6*  --   PHOS  --  3.3  --  Liver Function Tests: Recent Labs  Lab 02/14/20 2031 02/15/20 0708  AST 22 21  ALT 13 13  ALKPHOS 82 83  BILITOT 0.5 0.7  PROT 5.5* 5.5*  ALBUMIN 1.9* 1.8*   No results for input(s): LIPASE, AMYLASE in the last 168 hours. No results for input(s): AMMONIA in the last 168 hours. CBC: Recent Labs  Lab 02/14/20 2031 02/15/20 0708  WBC 20.3* 23.6*  NEUTROABS 18.7*  --   HGB 3.7* 6.7*  HCT 13.3* 22.2*  MCV 70.0* 75.8*  PLT 276 244   Cardiac Enzymes: No results for  input(s): CKTOTAL, CKMB, CKMBINDEX, TROPONINI in the last 168 hours. CBG (last 3)  No results for input(s): GLUCAP in the last 72 hours. Recent Results (from the past 240 hour(s))  Blood Culture (routine x 2)     Status: None (Preliminary result)   Collection Time: 02/14/20  9:55 PM   Specimen: BLOOD  Result Value Ref Range Status   Specimen Description BLOOD LEFT ANTECUBITAL  Final   Special Requests   Final    BOTTLES DRAWN AEROBIC AND ANAEROBIC Blood Culture adequate volume   Culture   Final    NO GROWTH < 12 HOURS Performed at [facility], [address] [place], [place] [address]    Report Status PENDING  Incomplete  Blood Culture (routine x 2)     Status: None (Preliminary result)   Collection Time: 02/14/20  9:55 PM   Specimen: BLOOD LEFT HAND  Result Value Ref Range Status   Specimen Description BLOOD LEFT HAND  Final   Special Requests   Final    BOTTLES DRAWN AEROBIC AND ANAEROBIC Blood Culture adequate volume   Culture   Final    NO GROWTH < 12 HOURS Performed at [facility], [address], [place], [place] [address]    Report Status PENDING  Incomplete  Respiratory Panel by RT PCR (Flu A&B, Covid) - Nasopharyngeal Swab     Status: None   Collection Time: 02/15/20 12:21 AM   Specimen: Nasopharyngeal Swab  Result Value Ref Range Status   SARS Coronavirus 2 by RT PCR NEGATIVE NEGATIVE Final    Comment: (NOTE) SARS-CoV-2 target nucleic acids are NOT DETECTED. The SARS-CoV-2 RNA is generally detectable in upper respiratoy specimens during the acute phase of infection. The lowest concentration of SARS-CoV-2 viral copies this assay can detect is 131 copies/mL. A negative result does not preclude SARS-Cov-2 infection and should not be used as the sole basis for treatment or other patient management decisions. A negative result may occur with  improper specimen collection/handling, submission of specimen other than nasopharyngeal swab, presence of viral mutation(s)  within the areas targeted by this assay, and inadequate number of viral copies (<131 copies/mL). A negative result must be combined with clinical observations, patient history, and epidemiological information. The expected result is Negative. Fact Sheet for Patients:  https://www.moore.com/ Fact Sheet for Healthcare Providers:  https://www.young.biz/ This test is not yet ap proved or cleared by the [place] FDA and  has been authorized for detection and/or diagnosis of SARS-CoV-2 by FDA under an Emergency Use Authorization (EUA). This EUA will remain  in effect (meaning this test can be used) for the duration of the COVID-19 declaration under Section 564(b)(1) of the Act, 21 U.S.C. section 360bbb-3(b)(1), unless the authorization is terminated or revoked sooner.    Influenza A by PCR NEGATIVE NEGATIVE Final   Influenza B by PCR NEGATIVE NEGATIVE Final    Comment: (NOTE) The Xpert Xpress  SARS-CoV-2/FLU/RSV assay is intended as an aid in  the diagnosis of influenza from Nasopharyngeal swab specimens and  should not be used as a sole basis for treatment. Nasal washings and  aspirates are unacceptable for Xpert Xpress SARS-CoV-2/FLU/RSV  testing. Fact Sheet for Patients: https://www.moore.com/ Fact Sheet for Healthcare Providers: https://www.young.biz/ This test is not yet approved or cleared by the [place] FDA and  has been authorized for detection and/or diagnosis of SARS-CoV-2 by  FDA under an Emergency Use Authorization (EUA). This EUA will remain  in effect (meaning this test can be used) for the duration of the  Covid-19 declaration under Section 564(b)(1) of the Act, 21  U.S.C. section 360bbb-3(b)(1), unless the authorization is  terminated or revoked. Performed at [facility], [address], [place], [place] [address]   MRSA PCR Screening     Status: None   Collection Time: 02/15/20   2:49 AM   Specimen: Nasal Mucosa; Nasopharyngeal  Result Value Ref Range Status   MRSA by PCR NEGATIVE NEGATIVE Final    Comment:        The GeneXpert MRSA Assay (FDA approved for NASAL specimens only), is one component of a comprehensive MRSA colonization surveillance program. It is not intended to diagnose MRSA infection nor to guide or monitor treatment for MRSA infections. Performed at [facility], [address] [place], [place] [address]      Studies: CT ABDOMEN PELVIS W CONTRAST  Result Date: 02/15/2020 CLINICAL DATA:  Nausea and vomiting and anemia EXAM: CT ABDOMEN AND PELVIS WITH CONTRAST TECHNIQUE: Multidetector CT imaging of the abdomen and pelvis was performed using the standard protocol following bolus administration of intravenous contrast. CONTRAST:  OMNIPAQUE IOHEXOL 300 MG/ML  SOLN COMPARISON:  None. FINDINGS: Lower chest: Lung bases demonstrate small left pleural effusion and bibasilar dependent atelectatic changes. Hepatobiliary: No focal liver abnormality is seen. No gallstones, gallbladder wall thickening, or biliary dilatation. Pancreas: Unremarkable. No pancreatic ductal dilatation or surrounding inflammatory changes. Spleen: Normal in size without focal abnormality. Adrenals/Urinary Tract: Adrenal glands are within normal limits. Kidneys demonstrate a normal enhancement pattern. No renal calculi or obstructive changes are seen. Normal excretion of contrast material is noted on delayed images. Extrarenal pelvis is noted on the left. The bladder is well distended. Stomach/Bowel: Colon is well visualized and shows no obstructive or inflammatory changes. The appendix is within normal limits. The small bowel demonstrates scattered fluid although no obstructive changes are seen. Vascular/Lymphatic: Aortic atherosclerosis. No enlarged abdominal or pelvic lymph nodes. Reproductive: Prostate is unremarkable. Other: No abdominal wall hernia or abnormality. No abdominopelvic  ascites. Musculoskeletal: No acute or significant osseous findings. IMPRESSION: Small left pleural effusion and bibasilar dependent atelectatic changes. No other focal abnormality is noted. Electronically Signed   By: Alcide Clever M.D.   On: 02/15/2020 02:16   DG Chest Port 1 View  Result Date: 02/14/2020 CLINICAL DATA:  Shortness of breath EXAM: PORTABLE CHEST 1 VIEW COMPARISON:  02/05/2019 FINDINGS: Cardiac shadow is within normal limits. The lungs are hyperinflated. Diffuse alveolar opacity is noted in the left mid lung consistent with acute infiltrate. This is new from the prior exam. No effusion is seen. No bony abnormality is noted. IMPRESSION: Left midlung infiltrate. Electronically Signed   By: Alcide Clever M.D.   On: 02/14/2020 19:56     Scheduled Meds: . Chlorhexidine Gluconate Cloth  6 each Topical Daily  . folic acid  1 mg Oral Daily  . multivitamin with minerals  1 tablet Oral Daily  .  nicotine  21 mg Transdermal Daily  . sodium chloride flush  3 mL Intravenous Q12H  . thiamine  100 mg Oral Daily   Or  . thiamine  100 mg Intravenous Daily   Continuous Infusions: . sodium chloride    . [START ON 02/16/2020] azithromycin    . cefTRIAXone (ROCEPHIN)  IV 200 mL/hr at 02/15/20 1500    Active Problems:   Symptomatic anemia   Community acquired pneumonia of left lung   SOB (shortness of breath)   Protein-calorie malnutrition, severe  Critical Care Procedure Note Authorized and Performed by: Maryln Manuel MD  Total Critical Care time:  32 minutes  Due to a high probability of clinically significant, life threatening deterioration, the patient required my highest level of preparedness to intervene emergently and I personally spent this critical care time directly and personally managing the patient.  This critical care time included obtaining a history; examining the patient, pulse oximetry; ordering and review of studies; arranging urgent treatment with development of a management  plan; evaluation of patient's response of treatment; frequent reassessment; and discussions with other providers.  This critical care time was performed to assess and manage the high probability of imminent and life threatening deterioration that could result in multi-organ failure.  It was exclusive of separately billable procedures and treating other patients and teaching time.    Standley Dakins, MD [facility] 02/15/2020, 5:17 PM    LOS: 1 day  How to contact the [facility] Attending or Consulting provider 7A - 7P or covering provider during after hours 7P -7A, for this patient?  1. Check the care team in [facility] and look for a) attending/consulting [facility] provider listed and b) the [facility] team listed 2. Log into [email or web address] and use [place]'s universal password to access. If you do not have the password, please contact the hospital operator. 3. Locate the [facility] provider you are looking for under [facility] and page to a number that you can be directly reached. 4. If you still have difficulty reaching the provider, please page the [facility] (Director on Call) for the Hospitalists listed on amion for assistance.

## 2020-02-15 NOTE — Care Management Important Message (Signed)
Important Message  Patient Details  Name: Spencer Hickman MRN: [id number] Date of Birth: Feb 25, 1956   Medicare Important Message Given:  Yes     Corey Harold 02/15/2020, 1:54 PM

## 2020-02-16 LAB — TYPE AND SCREEN
ABO/RH(D): A POS
Antibody Screen: NEGATIVE
Unit division: 0
Unit division: 0
Unit division: 0
Unit division: 0

## 2020-02-16 LAB — BPAM RBC
Blood Product Expiration Date: 202103212359
Blood Product Expiration Date: 202103282359
Blood Product Expiration Date: 202103282359
Blood Product Expiration Date: 202103292359
ISSUE DATE / TIME: 202103042347
ISSUE DATE / TIME: 202103050237
ISSUE DATE / TIME: 202103051059
ISSUE DATE / TIME: 202103051444
Unit Type and Rh: 6200
Unit Type and Rh: 6200
Unit Type and Rh: 6200
Unit Type and Rh: 6200

## 2020-02-16 LAB — COMPREHENSIVE METABOLIC PANEL
ALT: 13 U/L (ref 0–44)
AST: 21 U/L (ref 15–41)
Albumin: 1.8 g/dL — ABNORMAL LOW (ref 3.5–5.0)
Alkaline Phosphatase: 92 U/L (ref 38–126)
Anion gap: 6 (ref 5–15)
BUN: 11 mg/dL (ref 8–23)
CO2: 24 mmol/L (ref 22–32)
Calcium: 7.9 mg/dL — ABNORMAL LOW (ref 8.9–10.3)
Chloride: 106 mmol/L (ref 98–111)
Creatinine, Ser: 0.79 mg/dL (ref 0.61–1.24)
GFR calc Af Amer: >60 mL/min (ref >60)
GFR calc non Af Amer: >60 mL/min (ref >60)
Glucose, Bld: 103 mg/dL — ABNORMAL HIGH (ref 70–99)
Potassium: 3.4 mmol/L — ABNORMAL LOW (ref 3.5–5.1)
Sodium: 136 mmol/L (ref 135–145)
Total Bilirubin: 0.8 mg/dL (ref 0.3–1.2)
Total Protein: 5.7 g/dL — ABNORMAL LOW (ref 6.5–8.1)

## 2020-02-16 LAB — CBC
HCT: 30.2 % — ABNORMAL LOW (ref 39.0–52.0)
Hemoglobin: 9.6 g/dL — ABNORMAL LOW (ref 13.0–17.0)
MCH: 24.6 pg — ABNORMAL LOW (ref 26.0–34.0)
MCHC: 31.8 g/dL (ref 30.0–36.0)
MCV: 77.2 fL — ABNORMAL LOW (ref 80.0–100.0)
Platelets: 235 10*3/uL (ref 150–400)
RBC: 3.91 MIL/uL — ABNORMAL LOW (ref 4.22–5.81)
RDW: 22 % — ABNORMAL HIGH (ref 11.5–15.5)
WBC: 23 10*3/uL — ABNORMAL HIGH (ref 4.0–10.5)
nRBC: 0.4 % — ABNORMAL HIGH (ref 0.0–0.2)

## 2020-02-16 LAB — MAGNESIUM: Magnesium: 1.9 mg/dL (ref 1.7–2.4)

## 2020-02-16 MED ORDER — GUAIFENESIN-DM 100-10 MG/5ML PO SYRP
5.0000 mL | ORAL_SOLUTION | ORAL | Status: DC | PRN
  Administered 2020-02-16: 5 mL via ORAL
  Filled 2020-02-16: qty 5

## 2020-02-16 NOTE — Progress Notes (Signed)
PROGRESS NOTE Spencer Hickman   Spencer Hickman  TMH:[id number]  DOB: [birth year]  DOA: 02/14/2020 PCP: Patient, No Pcp Per   Brief Admission Hx: 64 year old male with COPD, CAD, chronic tobacco abuse and chronic alcohol abuse anemia presented to the emergency department with chest pain symptoms for 3 weeks.  He had recently been discharged from the hospital last month [place] and now returns with severe anemia with a hemoglobin of 3.7.  MDM/Assessment & Plan:   1. Severe symptomatic anemia-secondary to chronic blood loss.  He has been transfused 2 units packed red blood cells and an additional 2 units was ordered 02/15/20.  Hg improved to 9.6.   Patient had recently declined EGD and colonoscopy but seems more agreeable at this time.  I have consulted GI for evaluation and consideration when he is more medically stable for procedures. 2. Community-acquired pneumonia-his SARS 2 Covid test has been negative.  He started on ceftriaxone and azithromycin for community-acquired pneumonia.  Continue supportive care.  Follow cultures. 3. History of chronic alcohol abuse-high risk for acute alcohol withdrawal he has been placed on CIWA protocol and vitamin supplementation as ordered.  Continue to follow. 4. Atypical chest pain-symptoms secondary to severe anemia and seem to have resolved with transfusions.  Continue to monitor.  He is on a telemetry monitor and his EKG did not show any acute findings do not suspect ACS. 5. Chronic COPD-resume home bronchodilators. 6. Abnormal weight loss-patient has been seen by the dietitian and supplementation ordered for diet. 7. Severe protein calorie malnutrition-supplements as ordered. 8. Hypokalemia - oral replacement ordered.    DVT prophylaxis: SCD Code Status: Full Family Communication: Patient updated at bedside Disposition Plan: From home, presents with severe anemia requiring multiple packed red blood cell transfusions and subspecialty  consultation, awaiting GI recs for procedures   Consultants:  GI  Subjective: Patient without specific complaints this morning  Objective: Vitals:   02/15/20 1955 02/15/20 2353 02/16/20 0626 02/16/20 0900  BP:  119/73 117/80   Pulse:  (!) 107 98 96  Resp:      Temp:  98.8 F (37.1 C) 98.5 F (36.9 C)   TempSrc:  Oral Oral   SpO2: 92% 100% 99%   Weight:        Intake/Output Summary (Last 24 hours) at 02/16/2020 1129 Last data filed at 02/16/2020 0744 Gross per 24 hour  Intake 1454.07 ml  Output 1800 ml  Net -345.93 ml   Filed Weights   02/15/20 1444  Weight: 63.8 kg    REVIEW OF SYSTEMS  As per history otherwise all reviewed and reported negative  Exam:  General exam: Thin frail emaciated male chronically ill-appearing lying in bed he is arousable but appears somnolent Respiratory system:  No increased work of breathing. Cardiovascular system: Normal S1 & S2 heard.  Gastrointestinal system: Abdomen is nondistended, soft and nontender. Normal bowel sounds heard. Central nervous system: Alert and oriented. No focal neurological deficits. Extremities: no CCE.  Data Reviewed: Basic Metabolic Panel: Recent Labs  Lab 02/14/20 2031 02/14/20 2354 02/15/20 0708 02/16/20 0735  NA 135  --  140 136  K 4.3  --  4.1 3.4*  CL 108  --  112* 106  CO2 19*  --  22 24  GLUCOSE 98  --  92 103*  BUN 18  --  15 11  CREATININE 0.99  --  0.79 0.79  CALCIUM 7.8*  --  7.7* 7.9*  MG  --  1.6*  --  1.9  PHOS  --  3.3  --   --    Liver Function Tests: Recent Labs  Lab 02/14/20 2031 02/15/20 0708 02/16/20 0735  AST 22 21 21   ALT 13 13 13   ALKPHOS 82 83 92  BILITOT 0.5 0.7 0.8  PROT 5.5* 5.5* 5.7*  ALBUMIN 1.9* 1.8* 1.8*   No results for input(s): LIPASE, AMYLASE in the last 168 hours. No results for input(s): AMMONIA in the last 168 hours. CBC: Recent Labs  Lab 02/14/20 2031 02/15/20 0708 02/16/20 0735  WBC 20.3* 23.6* 23.0*  NEUTROABS 18.7*  --   --   HGB 3.7*  6.7* 9.6*  HCT 13.3* 22.2* 30.2*  MCV 70.0* 75.8* 77.2*  PLT 276 244 235   Cardiac Enzymes: No results for input(s): CKTOTAL, CKMB, CKMBINDEX, TROPONINI in the last 168 hours. CBG (last 3)  No results for input(s): GLUCAP in the last 72 hours. Recent Results (from the past 240 hour(s))  Blood Culture (routine x 2)     Status: None (Preliminary result)   Collection Time: 02/14/20  9:55 PM   Specimen: BLOOD  Result Value Ref Range Status   Specimen Description BLOOD LEFT ANTECUBITAL  Final   Special Requests   Final    BOTTLES DRAWN AEROBIC AND ANAEROBIC Blood Culture adequate volume   Culture   Final    NO GROWTH 2 DAYS Performed at [facility], [address] [place], [address] [place]    Report Status PENDING  Incomplete  Blood Culture (routine x 2)     Status: None (Preliminary result)   Collection Time: 02/14/20  9:55 PM   Specimen: BLOOD LEFT HAND  Result Value Ref Range Status   Specimen Description BLOOD LEFT HAND  Final   Special Requests   Final    BOTTLES DRAWN AEROBIC AND ANAEROBIC Blood Culture adequate volume   Culture   Final    NO GROWTH 2 DAYS Performed at [facility], [address] [place], [address] [place]    Report Status PENDING  Incomplete  Respiratory Panel by RT PCR (Flu A&B, Covid) - Nasopharyngeal Swab     Status: None   Collection Time: 02/15/20 12:21 AM   Specimen: Nasopharyngeal Swab  Result Value Ref Range Status   SARS Coronavirus 2 by RT PCR NEGATIVE NEGATIVE Final    Comment: (NOTE) SARS-CoV-2 target nucleic acids are NOT DETECTED. The SARS-CoV-2 RNA is generally detectable in upper respiratoy specimens during the acute phase of infection. The lowest concentration of SARS-CoV-2 viral copies this assay can detect is 131 copies/mL. A negative result does not preclude SARS-Cov-2 infection and should not be used as the sole basis for treatment or other patient management decisions. A negative result may occur with  improper specimen  collection/handling, submission of specimen other than nasopharyngeal swab, presence of viral mutation(s) within the areas targeted by this assay, and inadequate number of viral copies (<131 copies/mL). A negative result must be combined with clinical observations, patient history, and epidemiological information. The expected result is Negative. Fact Sheet for Patients:  41324 Fact Sheet for Healthcare Providers:  04/16/20 This test is not yet ap proved or cleared by the https://www.moore.com/ FDA and  has been authorized for detection and/or diagnosis of SARS-CoV-2 by FDA under an Emergency Use Authorization (EUA). This EUA will remain  in effect (meaning this test can be used) for the duration of the COVID-19 declaration under Section 564(b)(1) of the Act, 21 U.S.C. section 360bbb-3(b)(1), unless the authorization is  terminated or revoked sooner.    Influenza A by PCR NEGATIVE NEGATIVE Final   Influenza B by PCR NEGATIVE NEGATIVE Final    Comment: (NOTE) The Xpert Xpress SARS-CoV-2/FLU/RSV assay is intended as an aid in  the diagnosis of influenza from Nasopharyngeal swab specimens and  should not be used as a sole basis for treatment. Nasal washings and  aspirates are unacceptable for Xpert Xpress SARS-CoV-2/FLU/RSV  testing. Fact Sheet for Patients: https://www.moore.com/ Fact Sheet for Healthcare Providers: https://www.young.biz/ This test is not yet approved or cleared by the [place] FDA and  has been authorized for detection and/or diagnosis of SARS-CoV-2 by  FDA under an Emergency Use Authorization (EUA). This EUA will remain  in effect (meaning this test can be used) for the duration of the  Covid-19 declaration under Section 564(b)(1) of the Act, 21  U.S.C. section 360bbb-3(b)(1), unless the authorization is  terminated or revoked. Performed at [facility],  [address], [place], [place] [address]   MRSA PCR Screening     Status: None   Collection Time: 02/15/20  2:49 AM   Specimen: Nasal Mucosa; Nasopharyngeal  Result Value Ref Range Status   MRSA by PCR NEGATIVE NEGATIVE Final    Comment:        The GeneXpert MRSA Assay (FDA approved for NASAL specimens only), is one component of a comprehensive MRSA colonization surveillance program. It is not intended to diagnose MRSA infection nor to guide or monitor treatment for MRSA infections. Performed at [facility], [address] [place], [place] [address]      Studies: CT ABDOMEN PELVIS W CONTRAST  Result Date: 02/15/2020 CLINICAL DATA:  Nausea and vomiting and anemia EXAM: CT ABDOMEN AND PELVIS WITH CONTRAST TECHNIQUE: Multidetector CT imaging of the abdomen and pelvis was performed using the standard protocol following bolus administration of intravenous contrast. CONTRAST:  OMNIPAQUE IOHEXOL 300 MG/ML  SOLN COMPARISON:  None. FINDINGS: Lower chest: Lung bases demonstrate small left pleural effusion and bibasilar dependent atelectatic changes. Hepatobiliary: No focal liver abnormality is seen. No gallstones, gallbladder wall thickening, or biliary dilatation. Pancreas: Unremarkable. No pancreatic ductal dilatation or surrounding inflammatory changes. Spleen: Normal in size without focal abnormality. Adrenals/Urinary Tract: Adrenal glands are within normal limits. Kidneys demonstrate a normal enhancement pattern. No renal calculi or obstructive changes are seen. Normal excretion of contrast material is noted on delayed images. Extrarenal pelvis is noted on the left. The bladder is well distended. Stomach/Bowel: Colon is well visualized and shows no obstructive or inflammatory changes. The appendix is within normal limits. The small bowel demonstrates scattered fluid although no obstructive changes are seen. Vascular/Lymphatic: Aortic atherosclerosis. No enlarged abdominal or pelvic lymph nodes.  Reproductive: Prostate is unremarkable. Other: No abdominal wall hernia or abnormality. No abdominopelvic ascites. Musculoskeletal: No acute or significant osseous findings. IMPRESSION: Small left pleural effusion and bibasilar dependent atelectatic changes. No other focal abnormality is noted. Electronically Signed   By: Alcide Clever M.D.   On: 02/15/2020 02:16   DG Chest Port 1 View  Result Date: 02/14/2020 CLINICAL DATA:  Shortness of breath EXAM: PORTABLE CHEST 1 VIEW COMPARISON:  02/05/2019 FINDINGS: Cardiac shadow is within normal limits. The lungs are hyperinflated. Diffuse alveolar opacity is noted in the left mid lung consistent with acute infiltrate. This is new from the prior exam. No effusion is seen. No bony abnormality is noted. IMPRESSION: Left midlung infiltrate. Electronically Signed   By: Alcide Clever M.D.   On: 02/14/2020 19:56   Scheduled  Meds: . Chlorhexidine Gluconate Cloth  6 each Topical Daily  . folic acid  1 mg Oral Daily  . multivitamin with minerals  1 tablet Oral Daily  . nicotine  21 mg Transdermal Daily  . sodium chloride flush  3 mL Intravenous Q12H  . thiamine  100 mg Oral Daily   Or  . thiamine  100 mg Intravenous Daily   Continuous Infusions: . sodium chloride    . azithromycin 500 mg (02/15/20 2351)  . cefTRIAXone (ROCEPHIN)  IV 200 mL/hr at 02/15/20 1500    Active Problems:   Symptomatic anemia   Community acquired pneumonia of left lung   SOB (shortness of breath)   Protein-calorie malnutrition, severe   Standley Dakins, MD [facility] 02/16/2020, 11:29 AM    LOS: 2 days  How to contact the [facility] Attending or Consulting provider 7A - 7P or covering provider during after hours 7P -7A, for this patient?  1. Check the care team in [facility] and look for a) attending/consulting TRH provider listed and b) the [facility] team listed 2. Log into [email or web address] and use 's universal password to access. If you do not have the password, please contact  the hospital operator. 3. Locate the [facility] provider you are looking for under [facility] and page to a number that you can be directly reached. 4. If you still have difficulty reaching the provider, please page the [facility] (Director on Call) for the Hospitalists listed on amion for assistance.

## 2020-02-17 LAB — BASIC METABOLIC PANEL
Anion gap: 12 (ref 5–15)
BUN: 7 mg/dL — ABNORMAL LOW (ref 8–23)
CO2: 16 mmol/L — ABNORMAL LOW (ref 22–32)
Calcium: 7.9 mg/dL — ABNORMAL LOW (ref 8.9–10.3)
Chloride: 107 mmol/L (ref 98–111)
Creatinine, Ser: 0.65 mg/dL (ref 0.61–1.24)
GFR calc Af Amer: >60 mL/min (ref >60)
GFR calc non Af Amer: >60 mL/min (ref >60)
Glucose, Bld: 65 mg/dL — ABNORMAL LOW (ref 70–99)
Potassium: 3.8 mmol/L (ref 3.5–5.1)
Sodium: 135 mmol/L (ref 135–145)

## 2020-02-17 LAB — CBC
HCT: 31.4 % — ABNORMAL LOW (ref 39.0–52.0)
Hemoglobin: 9.5 g/dL — ABNORMAL LOW (ref 13.0–17.0)
MCH: 23.9 pg — ABNORMAL LOW (ref 26.0–34.0)
MCHC: 30.3 g/dL (ref 30.0–36.0)
MCV: 79.1 fL — ABNORMAL LOW (ref 80.0–100.0)
Platelets: 190 10*3/uL (ref 150–400)
RBC: 3.97 MIL/uL — ABNORMAL LOW (ref 4.22–5.81)
RDW: 23.3 % — ABNORMAL HIGH (ref 11.5–15.5)
WBC: 17.8 10*3/uL — ABNORMAL HIGH (ref 4.0–10.5)
nRBC: 0.2 % (ref 0.0–0.2)

## 2020-02-17 MED ORDER — FERROUS SULFATE 325 (65 FE) MG PO TABS
325.0000 mg | ORAL_TABLET | Freq: Every day | ORAL | Status: DC
  Administered 2020-02-17 – 2020-02-18 (×2): 325 mg via ORAL
  Filled 2020-02-17: qty 1

## 2020-02-17 MED ORDER — POTASSIUM CHLORIDE CRYS ER 20 MEQ PO TBCR
20.0000 meq | EXTENDED_RELEASE_TABLET | Freq: Two times a day (BID) | ORAL | Status: AC
  Administered 2020-02-17 (×2): 20 meq via ORAL
  Filled 2020-02-17 (×2): qty 1

## 2020-02-17 MED ORDER — PEG 3350-KCL-NA BICARB-NACL 420 G PO SOLR
4000.0000 mL | Freq: Once | ORAL | Status: AC
  Administered 2020-02-17: 4000 mL via ORAL

## 2020-02-17 MED ORDER — AZITHROMYCIN 250 MG PO TABS
500.0000 mg | ORAL_TABLET | Freq: Every day | ORAL | Status: DC
  Administered 2020-02-17: 500 mg via ORAL
  Filled 2020-02-17: qty 2

## 2020-02-17 NOTE — Progress Notes (Signed)
PROGRESS NOTE [facility]   Spencer Hickman  HMC:[id number]  DOB: 18-Jan-1956  DOA: 02/14/2020 PCP: Patient, No Pcp Per   Brief Admission Hx: 64 year old male with COPD, CAD, chronic tobacco abuse and chronic alcohol abuse anemia presented to the emergency department with chest pain symptoms for 3 weeks.  He had recently been discharged from the hospital last month [place] and now returns with severe anemia with a hemoglobin of 3.7.  MDM/Assessment & Plan:   1. Severe symptomatic anemia-secondary to chronic blood loss.  He has been transfused 2 units packed red blood cells and an additional 2 units was ordered 02/15/20.  Hg improved to 9.5.   Patient had recently declined EGD and colonoscopy but seems more agreeable at this time.  I have consulted GI for evaluation and they are planning EGD/colonoscopy 02/18/20. 2. Community-acquired pneumonia-his SARS 2 Covid test has been negative.  He started on ceftriaxone and azithromycin for community-acquired pneumonia.  Continue supportive care.  Follow cultures. 3. History of chronic alcohol abuse-high risk for acute alcohol withdrawal he has been placed on CIWA protocol and vitamin supplementation as ordered.  Continue to follow.\ 4. Leukocytosis - WBC trending down.  5. Hypoglycemia - low reserves, he is eating now.   6. Atypical chest pain-symptoms secondary to severe anemia and seem to have resolved with transfusions.  Continue to monitor.  He is on a telemetry monitor and his EKG did not show any acute findings do not suspect ACS. 7. Chronic COPD-resume home bronchodilators. 8. Abnormal weight loss-patient has been seen by the dietitian and supplementation ordered for diet. Check CEA level.  9. Severe protein calorie malnutrition-supplements as ordered. Prealbumin very low at 8.1.  10. Hypokalemia - oral replacement ordered.  Check labs in AM.  11. Vit D deficiency - mild.  Oral replacement OTC.   DVT prophylaxis: SCD Code  Status: Full Family Communication: I spoke several times with family by telephone Disposition Plan: From home, presents with severe anemia requiring multiple packed red blood cell transfusions and subspecialty consultation, planning EGD/colonoscopy 02/18/20  Consultants:  GI  Subjective: Patient without specific complaints this morning  Objective: Vitals:   02/16/20 2203 02/17/20 0059 02/17/20 0359 02/17/20 0423  BP: 131/66 109/77  116/75  Pulse: (!) 102 98 84 86  Resp: 16 16  18   Temp: (!) 100.5 F (38.1 C) 99 F (37.2 C)  98.1 F (36.7 C)  TempSrc: Oral Oral  Oral  SpO2: 100% 96%  98%  Weight:        Intake/Output Summary (Last 24 hours) at 02/17/2020 1153 Last data filed at 02/17/2020 0300 Gross per 24 hour  Intake 350 ml  Output 1200 ml  Net -850 ml   Filed Weights   02/15/20 1444  Weight: 63.8 kg    REVIEW OF SYSTEMS  As per history otherwise all reviewed and reported negative  Exam:  General exam: Thin frail emaciated male chronically ill-appearing lying in bed he is arousable but appears somnolent Respiratory system:  No increased work of breathing. Cardiovascular system: Normal S1 & S2 heard.  Gastrointestinal system: Abdomen is nondistended, soft and nontender. Normal bowel sounds heard. Central nervous system: Alert and oriented. No focal neurological deficits. Extremities: no CCE.  Data Reviewed: Basic Metabolic Panel: Recent Labs  Lab 02/14/20 2031 02/14/20 2354 02/15/20 0708 02/16/20 0735 02/17/20 0558  NA 135  --  140 136 135  K 4.3  --  4.1 3.4* 3.8  CL 108  --  112*  106 107  CO2 19*  --  22 24 16*  GLUCOSE 98  --  92 103* 65*  BUN 18  --  15 11 7*  CREATININE 0.99  --  0.79 0.79 0.65  CALCIUM 7.8*  --  7.7* 7.9* 7.9*  MG  --  1.6*  --  1.9  --   PHOS  --  3.3  --   --   --    Liver Function Tests: Recent Labs  Lab 02/14/20 2031 02/15/20 0708 02/16/20 0735  AST 22 21 21   ALT 13 13 13   ALKPHOS 82 83 92  BILITOT 0.5 0.7 0.8  PROT  5.5* 5.5* 5.7*  ALBUMIN 1.9* 1.8* 1.8*   No results for input(s): LIPASE, AMYLASE in the last 168 hours. No results for input(s): AMMONIA in the last 168 hours. CBC: Recent Labs  Lab 02/14/20 2031 02/15/20 0708 02/16/20 0735 02/17/20 0558  WBC 20.3* 23.6* 23.0* 17.8*  NEUTROABS 18.7*  --   --   --   HGB 3.7* 6.7* 9.6* 9.5*  HCT 13.3* 22.2* 30.2* 31.4*  MCV 70.0* 75.8* 77.2* 79.1*  PLT 276 244 235 190   Cardiac Enzymes: No results for input(s): CKTOTAL, CKMB, CKMBINDEX, TROPONINI in the last 168 hours. CBG (last 3)  No results for input(s): GLUCAP in the last 72 hours. Recent Results (from the past 240 hour(s))  Blood Culture (routine x 2)     Status: None (Preliminary result)   Collection Time: 02/14/20  9:55 PM   Specimen: BLOOD  Result Value Ref Range Status   Specimen Description BLOOD LEFT ANTECUBITAL  Final   Special Requests   Final    BOTTLES DRAWN AEROBIC AND ANAEROBIC Blood Culture adequate volume   Culture   Final    NO GROWTH 2 DAYS Performed at [facility], [address] [place], [address] [place]    Report Status PENDING  Incomplete  Blood Culture (routine x 2)     Status: None (Preliminary result)   Collection Time: 02/14/20  9:55 PM   Specimen: BLOOD LEFT HAND  Result Value Ref Range Status   Specimen Description BLOOD LEFT HAND  Final   Special Requests   Final    BOTTLES DRAWN AEROBIC AND ANAEROBIC Blood Culture adequate volume   Culture   Final    NO GROWTH 2 DAYS Performed at [facility], [address], [place], [address] [place]    Report Status PENDING  Incomplete  Respiratory Panel by RT PCR (Flu A&B, Covid) - Nasopharyngeal Swab     Status: None   Collection Time: 02/15/20 12:21 AM   Specimen: Nasopharyngeal Swab  Result Value Ref Range Status   SARS Coronavirus 2 by RT PCR NEGATIVE NEGATIVE Final    Comment: (NOTE) SARS-CoV-2 target nucleic acids are NOT DETECTED. The SARS-CoV-2 RNA is generally detectable in upper  respiratoy specimens during the acute phase of infection. The lowest concentration of SARS-CoV-2 viral copies this assay can detect is 131 copies/mL. A negative result does not preclude SARS-Cov-2 infection and should not be used as the sole basis for treatment or other patient management decisions. A negative result may occur with  improper specimen collection/handling, submission of specimen other than nasopharyngeal swab, presence of viral mutation(s) within the areas targeted by this assay, and inadequate number of viral copies (<131 copies/mL). A negative result must be combined with clinical observations, patient history, and epidemiological information. The expected result is Negative. Fact Sheet for Patients:  47425 Fact Sheet  for Healthcare Providers:  https://www.young.biz/ This test is not yet ap proved or cleared by the [place] and  has been authorized for detection and/or diagnosis of SARS-CoV-2 by FDA under an Emergency Use Authorization (EUA). This EUA will remain  in effect (meaning this test can be used) for the duration of the COVID-19 declaration under Section 564(b)(1) of the Act, 21 U.S.C. section 360bbb-3(b)(1), unless the authorization is terminated or revoked sooner.    Influenza A by PCR NEGATIVE NEGATIVE Final   Influenza B by PCR NEGATIVE NEGATIVE Final    Comment: (NOTE) The Xpert Xpress SARS-CoV-2/FLU/RSV assay is intended as an aid in  the diagnosis of influenza from Nasopharyngeal swab specimens and  should not be used as a sole basis for treatment. Nasal washings and  aspirates are unacceptable for Xpert Xpress SARS-CoV-2/FLU/RSV  testing. Fact Sheet for Patients: https://www.moore.com/ Fact Sheet for Healthcare Providers: https://www.young.biz/ This test is not yet approved or cleared by the [place] FDA and  has been authorized for  detection and/or diagnosis of SARS-CoV-2 by  FDA under an Emergency Use Authorization (EUA). This EUA will remain  in effect (meaning this test can be used) for the duration of the  Covid-19 declaration under Section 564(b)(1) of the Act, 21  U.S.C. section 360bbb-3(b)(1), unless the authorization is  terminated or revoked. Performed at [facility], [address] [place], [place] [address]   MRSA PCR Screening     Status: None   Collection Time: 02/15/20  2:49 AM   Specimen: Nasal Mucosa; Nasopharyngeal  Result Value Ref Range Status   MRSA by PCR NEGATIVE NEGATIVE Final    Comment:        The GeneXpert MRSA Assay (FDA approved for NASAL specimens only), is one component of a comprehensive MRSA colonization surveillance program. It is not intended to diagnose MRSA infection nor to guide or monitor treatment for MRSA infections. Performed at [facility], [address], [place], [place] [address]      Studies: No results found. Scheduled Meds: . azithromycin  500 mg Oral QHS  . Chlorhexidine Gluconate Cloth  6 each Topical Daily  . ferrous sulfate  325 mg Oral Q breakfast  . folic acid  1 mg Oral Daily  . multivitamin with minerals  1 tablet Oral Daily  . nicotine  21 mg Transdermal Daily  . polyethylene glycol-electrolytes  4,000 mL Oral Once  . sodium chloride flush  3 mL Intravenous Q12H  . thiamine  100 mg Oral Daily   Or  . thiamine  100 mg Intravenous Daily   Continuous Infusions: . sodium chloride    . cefTRIAXone (ROCEPHIN)  IV 1 g (02/16/20 1424)    Active Problems:   Symptomatic anemia   Community acquired pneumonia of left lung   SOB (shortness of breath)   Protein-calorie malnutrition, severe   Standley Dakins, MD [facility] 02/17/2020, 11:53 AM    LOS: 3 days  How to contact the [facility] Attending or Consulting provider 7A - 7P or covering provider during after hours 7P -7A, for this patient?  1. Check the care team in [facility] and look for a)  attending/consulting TRH provider listed and b) the [facility] team listed 2. Log into [email or web address] and use [place]'s universal password to access. If you do not have the password, please contact the hospital operator. 3. Locate the [facility] provider you are looking for under [facility] and page to a number that you can be directly reached. 4.  If you still have difficulty reaching the provider, please page the [facility] (Director on Call) for the Hospitalists listed on amion for assistance.

## 2020-02-17 NOTE — Progress Notes (Signed)
Patient feeling much better today.  No abdominal pain, melena or hematochezia.  Tolerating diet. Afebrile;   leukocytosis improved.  Vital signs in last 24 hours: Temp:  [98.1 F (36.7 C)-100.5 F (38.1 C)] 98.1 F (36.7 C) (03/07 0423) Pulse Rate:  [84-103] 86 (03/07 0423) Resp:  [16-18] 18 (03/07 0423) BP: (109-131)/(66-77) 116/75 (03/07 0423) SpO2:  [96 %-100 %] 98 % (03/07 0423) Last BM Date: 02/17/20 General:   Disheveled appearing chronically ill.  But otherwise appropriate and pleasant. Abdomen: Nondistended.  Positive bowel sounds soft and nontender.  Extremities:  Without clubbing or edema.    Intake/Output from previous day: 03/06 0701 - 03/07 0700 In: 350 [IV Piggyback:350] Out: 1500 [Urine:1500] Intake/Output this shift: No intake/output data recorded.  Lab Results: Recent Labs    02/15/20 0708 02/16/20 0735 02/17/20 0558  WBC 23.6* 23.0* 17.8*  HGB 6.7* 9.6* 9.5*  HCT 22.2* 30.2* 31.4*  PLT 244 235 190   BMET Recent Labs    02/14/20 2031 02/15/20 0708 02/16/20 0735  NA 135 140 136  K 4.3 4.1 3.4*  CL 108 112* 106  CO2 19* 22 24  GLUCOSE 98 92 103*  BUN 18 15 11   CREATININE 0.99 0.79 0.79  CALCIUM 7.8* 7.7* 7.9*   LFT Recent Labs    02/16/20 0735  PROT 5.7*  ALBUMIN 1.8*  AST 21  ALT 13  ALKPHOS 92  BILITOT 0.8   PT/INR Recent Labs    02/14/20 2155  LABPROT 16.1*  INR 1.3*    Impression: Pleasant 64 year old gentleman with chronic alcohol abuse admitted with pneumonia and sepsis.  Profound iron deficiency anemia.  Hemoglobin much better with transfusion. Overall, significant clinical improvement since admission. No overt GI bleeding; Hemoccult negative on admission.  EGD and colonoscopy previously recommended for further evaluation of iron deficiency anemia.  Historically, patient has declined.  However, now he is interested in having his GI evaluation while he is here.  Recommendations: I have offered the patient both a diagnostic  EGD and colonoscopy tomorrow.  We will utilize propofol.The risks, benefits, limitations, imponderables and alternatives regarding both EGD and colonoscopy have been reviewed with the patient. Questions have been answered. All parties agreeable.   Further recommendations to follow after endoscopic evaluation tomorrow.

## 2020-02-18 ENCOUNTER — Inpatient Hospital Stay (HOSPITAL_COMMUNITY): Payer: PPO | Admitting: Anesthesiology

## 2020-02-18 ENCOUNTER — Encounter (HOSPITAL_COMMUNITY)
Admission: EM | Disposition: A | Discharge status: Home / Self Care | Payer: Self-pay | Source: Home / Self Care | Attending: Family Medicine

## 2020-02-18 ENCOUNTER — Encounter (HOSPITAL_COMMUNITY): Payer: Self-pay | Admitting: Family Medicine

## 2020-02-18 DIAGNOSIS — K3189 Other diseases of stomach and duodenum: Secondary | ICD-10-CM

## 2020-02-18 DIAGNOSIS — D509 Iron deficiency anemia, unspecified: Secondary | ICD-10-CM

## 2020-02-18 DIAGNOSIS — R634 Abnormal weight loss: Secondary | ICD-10-CM

## 2020-02-18 HISTORY — PX: BIOPSY: SHX5522

## 2020-02-18 HISTORY — PX: ESOPHAGOGASTRODUODENOSCOPY (EGD) WITH PROPOFOL: SHX5813

## 2020-02-18 HISTORY — PX: COLONOSCOPY WITH PROPOFOL: SHX5780

## 2020-02-18 LAB — CBC
HCT: 33.2 % — ABNORMAL LOW (ref 39.0–52.0)
Hemoglobin: 10 g/dL — ABNORMAL LOW (ref 13.0–17.0)
MCH: 24.1 pg — ABNORMAL LOW (ref 26.0–34.0)
MCHC: 30.1 g/dL (ref 30.0–36.0)
MCV: 80 fL (ref 80.0–100.0)
Platelets: 180 10*3/uL (ref 150–400)
RBC: 4.15 MIL/uL — ABNORMAL LOW (ref 4.22–5.81)
RDW: 23.7 % — ABNORMAL HIGH (ref 11.5–15.5)
WBC: 11.5 10*3/uL — ABNORMAL HIGH (ref 4.0–10.5)
nRBC: 0 % (ref 0.0–0.2)

## 2020-02-18 LAB — BASIC METABOLIC PANEL
Anion gap: 8 (ref 5–15)
BUN: 5 mg/dL — ABNORMAL LOW (ref 8–23)
CO2: 23 mmol/L (ref 22–32)
Calcium: 8 mg/dL — ABNORMAL LOW (ref 8.9–10.3)
Chloride: 104 mmol/L (ref 98–111)
Creatinine, Ser: 0.55 mg/dL — ABNORMAL LOW (ref 0.61–1.24)
GFR calc Af Amer: >60 mL/min (ref >60)
GFR calc non Af Amer: >60 mL/min (ref >60)
Glucose, Bld: 87 mg/dL (ref 70–99)
Potassium: 3.6 mmol/L (ref 3.5–5.1)
Sodium: 135 mmol/L (ref 135–145)

## 2020-02-18 LAB — URINALYSIS, ROUTINE W REFLEX MICROSCOPIC
Bilirubin Urine: NEGATIVE
Glucose, UA: NEGATIVE mg/dL
Hgb urine dipstick: NEGATIVE
Ketones, ur: NEGATIVE mg/dL
Leukocytes,Ua: NEGATIVE
Nitrite: NEGATIVE
Protein, ur: NEGATIVE mg/dL
Specific Gravity, Urine: 1.006 (ref 1.005–1.030)
pH: 7 (ref 5.0–8.0)

## 2020-02-18 LAB — CEA: CEA: 8.5 ng/mL — ABNORMAL HIGH (ref 0.0–4.7)

## 2020-02-18 LAB — MAGNESIUM: Magnesium: 1.7 mg/dL (ref 1.7–2.4)

## 2020-02-18 SURGERY — ESOPHAGOGASTRODUODENOSCOPY (EGD) WITH PROPOFOL
Anesthesia: Monitor Anesthesia Care

## 2020-02-18 MED ORDER — PROPOFOL 500 MG/50ML IV EMUL
INTRAVENOUS | Status: DC | PRN
  Administered 2020-02-18: 175 ug/kg/min via INTRAVENOUS

## 2020-02-18 MED ORDER — KETAMINE HCL 50 MG/5ML IJ SOSY
PREFILLED_SYRINGE | INTRAMUSCULAR | Status: AC
  Filled 2020-02-18: qty 5

## 2020-02-18 MED ORDER — FOLIC ACID 1 MG PO TABS: 1.0000 mg | ORAL_TABLET | Freq: Every day | ORAL | 1 refills | Status: DC

## 2020-02-18 MED ORDER — SODIUM CHLORIDE 0.9 % IV SOLN: INTRAVENOUS | Status: DC

## 2020-02-18 MED ORDER — DEXTROMETHORPHAN POLISTIREX ER 30 MG/5ML PO SUER: 30.0000 mg | Freq: Two times a day (BID) | ORAL | 0 refills | Status: DC | PRN

## 2020-02-18 MED ORDER — DEXTROMETHORPHAN POLISTIREX ER 30 MG/5ML PO SUER: 30.0000 mg | Freq: Two times a day (BID) | ORAL | Status: DC | PRN

## 2020-02-18 MED ORDER — FERROUS SULFATE 325 (65 FE) MG PO TABS: 325.0000 mg | ORAL_TABLET | Freq: Every day | ORAL | 1 refills | Status: AC

## 2020-02-18 MED ORDER — NALOXONE HCL 0.4 MG/ML IJ SOLN
INTRAMUSCULAR | Status: AC
  Filled 2020-02-18: qty 2

## 2020-02-18 MED ORDER — ADULT MULTIVITAMIN W/MINERALS CH: 1.0000 | ORAL_TABLET | Freq: Every day | ORAL | 1 refills | Status: AC

## 2020-02-18 MED ORDER — KETAMINE HCL 10 MG/ML IJ SOLN
INTRAMUSCULAR | Status: DC | PRN
  Administered 2020-02-18: 20 mg via INTRAVENOUS

## 2020-02-18 MED ORDER — LACTATED RINGERS IV SOLN: INTRAVENOUS | Status: DC

## 2020-02-18 MED ORDER — DOXYCYCLINE HYCLATE 100 MG PO CAPS: 100.0000 mg | ORAL_CAPSULE | Freq: Two times a day (BID) | ORAL | 0 refills | Status: AC

## 2020-02-18 MED ORDER — SUGAMMADEX SODIUM 500 MG/5ML IV SOLN
INTRAVENOUS | Status: AC
  Filled 2020-02-18: qty 5

## 2020-02-18 MED ORDER — PROPOFOL 10 MG/ML IV BOLUS
INTRAVENOUS | Status: DC | PRN
  Administered 2020-02-18: 40 mg via INTRAVENOUS
  Administered 2020-02-18: 20 mg via INTRAVENOUS

## 2020-02-18 MED ORDER — THIAMINE HCL 100 MG PO TABS: 100.0000 mg | ORAL_TABLET | Freq: Every day | ORAL | 1 refills | Status: AC

## 2020-02-18 MED ORDER — PHENYLEPHRINE HCL (PRESSORS) 10 MG/ML IV SOLN
INTRAVENOUS | Status: DC | PRN
  Administered 2020-02-18 (×2): 80 ug via INTRAVENOUS
  Administered 2020-02-18: 40 ug via INTRAVENOUS

## 2020-02-18 NOTE — Anesthesia Preprocedure Evaluation (Addendum)
Anesthesia Evaluation  Patient identified by MRN, date of birth, ID band Patient awake    Reviewed: H&P , NPO status , Patient's Chart, lab work & pertinent test results, reviewed documented beta blocker date and time   Airway Mallampati: I  TM Distance: >3 FB Neck ROM: Full    Dental  (+) Edentulous Upper, Edentulous Lower   Pulmonary pneumonia, resolved, COPD, Current Smoker,    Pulmonary exam normal breath sounds clear to auscultation       Cardiovascular + CAD  Normal cardiovascular exam     Neuro/Psych negative neurological ROS     GI/Hepatic negative GI ROS, Neg liver ROS,   Endo/Other  negative endocrine ROS  Renal/GU negative Renal ROS  negative genitourinary   Musculoskeletal   Abdominal Normal abdominal exam  (+)   Peds  Hematology  (+) Blood dyscrasia, anemia ,   Anesthesia Other Findings   Reproductive/Obstetrics                            Anesthesia Physical Anesthesia Plan  ASA: III  Anesthesia Plan: MAC   Post-op Pain Management:    Induction:   PONV Risk Score and Plan: 0 and Propofol infusion  Airway Management Planned:   Additional Equipment:   Intra-op Plan:   Post-operative Plan:   Informed Consent: I have reviewed the patients History and Physical, chart, labs and discussed the procedure including the risks, benefits and alternatives for the proposed anesthesia with the patient or authorized representative who has indicated his/her understanding and acceptance.       Plan Discussed with: CRNA  Anesthesia Plan Comments:         Anesthesia Quick Evaluation

## 2020-02-18 NOTE — Interval H&P Note (Signed)
History and Physical Interval Note:  02/18/2020 10:41 AM  Spencer Hickman  has presented today for surgery, with the diagnosis of Iron defiency anemia.  The various methods of treatment have been discussed with the patient and family. After consideration of risks, benefits and other options for treatment, the patient has consented to  Procedure(s): ESOPHAGOGASTRODUODENOSCOPY (EGD) WITH PROPOFOL (N/A) COLONOSCOPY WITH PROPOFOL (N/A) as a surgical intervention.  The patient's history has been reviewed, patient examined, no change in status, stable for surgery.  I have reviewed the patient's chart and labs.  Questions were answered to the patient's satisfaction.     [name]  No change.  Patient seen and examined.  Diagnostic EGD colonoscopy today for IDA.  The risks, benefits, limitations, imponderables and alternatives regarding both EGD and colonoscopy have been reviewed with the patient. Questions have been answered. All parties agreeable.

## 2020-02-18 NOTE — Care Management (Signed)
Voicemail message left at [facility] requesting a new patient appointment.

## 2020-02-18 NOTE — Discharge Summary (Signed)
Physician Discharge Summary  Boris SharperRussell J Bordeau ZOX:[id number] DOB: [birth year] DOA: 02/14/2020  PCP: Patient, No Pcp Per  Admit date: 02/14/2020 Discharge date: 02/18/2020  Admitted From:  HOME Disposition:   HOME   Recommendations for Outpatient Follow-up:  1. Follow up with PCP in 2 weeks 2. Follow up with GI in 3-4 months  3. Please obtain CBC in 2 weeks to check hemoglobin  4. Please abstain from alcohol   Discharge Condition: STABLE   CODE STATUS: FULL    Brief Hospitalization Summary: Please see all hospital notes, images, labs for full details of the hospitalization. ADMISSION HPI:   Frankey ShownRussell Sickles  is a 64 y.o. male, with a history of COPD, CAD, current use, tobacco abuse, chronic anemia, came to hospital with intermittent complaints of headache with chest pain for past 3 weeks.  He also has been having difficulty with breathing.  He denies specific head trauma.  Patient drinks 6 pack of beer per day.  He denies fever, chills, no visual disturbance, no numbness or weakness.  Also complains of intermittent chest pain with shortness of breath.  In the ED, lab work revealed hemoglobin of 3.7, his previous hemoglobin was 6.4 on 02/06/2019.  Chest x-ray revealed left midlung infiltrate.  Patient started on ceftriaxone and Zithromax.  Brief Admission Hx: 64 year old male with COPD, CAD, chronic tobacco abuse and chronic alcohol abuse anemia presented to the emergency department with chest pain symptoms for 3 weeks.  He had recently been discharged from the hospital last month AGAINST MEDICAL ADVICE and now returns with severe anemia with a hemoglobin of 3.7.  MDM/Assessment & Plan:   1. Severe symptomatic anemia-secondary to chronic blood loss.  He has been transfused 2 units packed red blood cells and an additional 2 units was ordered 02/15/20.  Hg improved to 9.5.   Patient had recently declined EGD and colonoscopy but seems more agreeable at this time. He completed EGD/colonoscopy 02/18/20 and  he had normal esophagus and normal colon.   He would need repeat colonoscopy in 10 years.  HE has been started on oral iron supplement 325 mg daily with breakfast and we assisted him with PCP follow up and should abstain from alcohol and have repeat CBC checked in 2 weeks.  2. Community-acquired pneumonia-his SARS 2 Covid test has been negative.  He started on ceftriaxone and azithromycin for community-acquired pneumonia.  Finish 2 more days of doxycycline at discharge.   3. History of chronic alcohol abuse-high risk for acute alcohol withdrawal he has been placed on CIWA protocol and vitamin supplementation as ordered.  He was advised to abstain from alcohol consumption and seek outpatient alcohol cessation treatment.   4. Leukocytosis - WBC trending down with treatments.   5. Hypoglycemia - resolved now.   6. Atypical chest pain-RESOLVED.  Symptoms secondary to severe anemia and seem to have resolved with transfusions.  He was on a telemetry monitor and his EKG did not show any acute findings do not suspect ACS. 7. Chronic COPD-resume home bronchodilators. 8. Abnormal weight loss-patient has been seen by the dietitian and supplementation ordered for diet.  CEA level was 8.5.   9. Severe protein calorie malnutrition-supplements as ordered. Prealbumin very low at 8.1.   Pt was seen by dietitian.  He needs to stop alcohol consumption and take vitamin supplements.  10. Hypokalemia - REPLETED.  11. Vit D deficiency - mild.  Oral replacement OTC replacement recommended.   DVT prophylaxis: SCD Code Status: Full Family Communication: I spoke several  times with family by telephone Disposition Plan: Home   Consultants:  GI Colonscopy 02/18/20 Impression:               - The entire examined colon is normal.                           - No specimens collected. Moderate Sedation:      Moderate (conscious) sedation was personally administered by an       anesthesia professional. The following parameters  were monitored: oxygen       saturation, heart rate, blood pressure, respiratory rate, EKG, adequacy       of pulmonary ventilation, and response to care. Recommendation:           - Return patient to hospital ward for observation.                           - Advance diet as tolerated.                           - Continue present medications.                           - Repeat colonoscopy in 10 years for screening                            purposes.                           - Return to GI office (date not yet determined).                            See EGD report. Alcohol abstinence. Good                            nutritional support. From a GI standpoint    EGD 02/18/20 Impression:       - Normal esophagus.                           - Erythematous /eroded gastric mucosa in the                            stomach. Innocent during AVM. Status post gastric                            biopsy- Normal duodenal bulb and second portion of                            the duodenum. Moderate Sedation:      Moderate (conscious) sedation was personally administered by an       anesthesia professional. The following parameters were monitored: oxygen       saturation, heart rate, blood pressure, respiratory rate, EKG, adequacy       of pulmonary ventilation, and response to care. Recommendation:           - Patient has a contact number available for  emergencies. The signs and symptoms of potential                            delayed complications were discussed with the                            patient. Return to normal activities tomorrow.                            Written discharge instructions were provided to the                            patient.                           - Resume regular diet.                           - Continue present medications.                           - Return patient to hospital ward for observation.                            Low up on  pathology                           - Continue present medications. Colonoscopy report.                            Abstain from alcohol. nutrtional support. See                            colonoscopy report Discharge Diagnoses:  Active Problems:   Symptomatic anemia   Community acquired pneumonia of left lung   SOB (shortness of breath)   Protein-calorie malnutrition, severe   Discharge Instructions:  Allergies as of 02/18/2020   No Known Allergies     Medication List    STOP taking these medications   acetaminophen 500 MG tablet Commonly known as: TYLENOL   alprazolam 2 MG tablet Commonly known as: XANAX   naproxen sodium 220 MG tablet Commonly known as: ALEVE     TAKE these medications   dextromethorphan 30 MG/5ML liquid Commonly known as: DELSYM Take 5 mLs (30 mg total) by mouth 2 (two) times daily as needed for cough.   doxycycline 100 MG capsule Commonly known as: VIBRAMYCIN Take 1 capsule (100 mg total) by mouth 2 (two) times daily for 2 days.   ferrous sulfate 325 (65 FE) MG tablet Take 1 tablet (325 mg total) by mouth daily with breakfast. Start taking on: February 18, 299   folic acid 1 MG tablet Commonly known as: FOLVITE Take 1 tablet (1 mg total) by mouth daily. Start taking on: February 19, 2020   multivitamin with minerals Tabs tablet Take 1 tablet by mouth daily. Start taking on: February 19, 2020   thiamine 100 MG tablet Take 1 tablet (100 mg total) by mouth daily. Start taking on: February 19, 2020      Follow-up Information  [facility]. Schedule an appointment as soon as possible for a visit in 3 month(s).   Contact information: [address] [place] [address] [phone]       Primary care provider. Schedule an appointment as soon as possible for a visit in 2 week(s).          No Known Allergies Allergies as of 02/18/2020   No Known Allergies     Medication List    STOP taking these  medications   acetaminophen 500 MG tablet Commonly known as: TYLENOL   alprazolam 2 MG tablet Commonly known as: XANAX   naproxen sodium 220 MG tablet Commonly known as: ALEVE     TAKE these medications   dextromethorphan 30 MG/5ML liquid Commonly known as: DELSYM Take 5 mLs (30 mg total) by mouth 2 (two) times daily as needed for cough.   doxycycline 100 MG capsule Commonly known as: VIBRAMYCIN Take 1 capsule (100 mg total) by mouth 2 (two) times daily for 2 days.   ferrous sulfate 325 (65 FE) MG tablet Take 1 tablet (325 mg total) by mouth daily with breakfast. Start taking on: February 19, 2020   folic acid 1 MG tablet Commonly known as: FOLVITE Take 1 tablet (1 mg total) by mouth daily. Start taking on: February 19, 2020   multivitamin with minerals Tabs tablet Take 1 tablet by mouth daily. Start taking on: February 19, 2020   thiamine 100 MG tablet Take 1 tablet (100 mg total) by mouth daily. Start taking on: February 19, 2020       Procedures/Studies: CT ABDOMEN PELVIS W CONTRAST  Result Date: 02/15/2020 CLINICAL DATA:  Nausea and vomiting and anemia EXAM: CT ABDOMEN AND PELVIS WITH CONTRAST TECHNIQUE: Multidetector CT imaging of the abdomen and pelvis was performed using the standard protocol following bolus administration of intravenous contrast. CONTRAST:  OMNIPAQUE IOHEXOL 300 MG/ML  SOLN COMPARISON:  None. FINDINGS: Lower chest: Lung bases demonstrate small left pleural effusion and bibasilar dependent atelectatic changes. Hepatobiliary: No focal liver abnormality is seen. No gallstones, gallbladder wall thickening, or biliary dilatation. Pancreas: Unremarkable. No pancreatic ductal dilatation or surrounding inflammatory changes. Spleen: Normal in size without focal abnormality. Adrenals/Urinary Tract: Adrenal glands are within normal limits. Kidneys demonstrate a normal enhancement pattern. No renal calculi or obstructive changes are seen. Normal excretion of contrast  material is noted on delayed images. Extrarenal pelvis is noted on the left. The bladder is well distended. Stomach/Bowel: Colon is well visualized and shows no obstructive or inflammatory changes. The appendix is within normal limits. The small bowel demonstrates scattered fluid although no obstructive changes are seen. Vascular/Lymphatic: Aortic atherosclerosis. No enlarged abdominal or pelvic lymph nodes. Reproductive: Prostate is unremarkable. Other: No abdominal wall hernia or abnormality. No abdominopelvic ascites. Musculoskeletal: No acute or significant osseous findings. IMPRESSION: Small left pleural effusion and bibasilar dependent atelectatic changes. No other focal abnormality is noted. Electronically Signed   By: Alcide Clever M.D.   On: 02/15/2020 02:16   DG Chest Port 1 View  Result Date: 02/14/2020 CLINICAL DATA:  Shortness of breath EXAM: PORTABLE CHEST 1 VIEW COMPARISON:  02/05/2019 FINDINGS: Cardiac shadow is within normal limits. The lungs are hyperinflated. Diffuse alveolar opacity is noted in the left mid lung consistent with acute infiltrate. This is new from the prior exam. No effusion is seen. No bony abnormality is noted. IMPRESSION: Left midlung infiltrate. Electronically Signed   By: Alcide Clever M.D.   On: 02/14/2020 19:56  Subjective: Pt without complaints, wants to go home.    Discharge Exam: Vitals:   02/18/20 1145 02/18/20 1418  BP: 112/78 130/77  Pulse: 95 92  Resp: 18 20  Temp: 97.8 F (36.6 C) 97.8 F (36.6 C)  SpO2: 100% 99%   Vitals:   02/18/20 1014 02/18/20 1131 02/18/20 1145 02/18/20 1418  BP: 127/77 (!) 82/61 112/78 130/77  Pulse: 91 98 95 92  Resp: 20 20 18 20   Temp: 97.9 F (36.6 C) 97.6 F (36.4 C) 97.8 F (36.6 C) 97.8 F (36.6 C)  TempSrc: Oral   Oral  SpO2: 99% 99% 100% 99%  Weight:      Height:       General exam: Thin frail emaciated male chronically ill-appearing lying in bed, no distress.  Respiratory system:  No increased  work of breathing. Cardiovascular system: Normal S1 & S2 heard.  Gastrointestinal system: Abdomen is nondistended, soft and nontender. Normal bowel sounds heard. Central nervous system: Alert and oriented. No focal neurological deficits. Extremities: no CCE.   The results of significant diagnostics from this hospitalization (including imaging, microbiology, ancillary and laboratory) are listed below for reference.     Microbiology: Recent Results (from the past 240 hour(s))  Blood Culture (routine x 2)     Status: None (Preliminary result)   Collection Time: 02/14/20  9:55 PM   Specimen: BLOOD  Result Value Ref Range Status   Specimen Description BLOOD LEFT ANTECUBITAL  Final   Special Requests   Final    BOTTLES DRAWN AEROBIC AND ANAEROBIC Blood Culture adequate volume   Culture   Final    NO GROWTH 4 DAYS Performed at [facility], [address] [place], [place]    Report Status PENDING  Incomplete  Blood Culture (routine x 2)     Status: None (Preliminary result)   Collection Time: 02/14/20  9:55 PM   Specimen: BLOOD LEFT HAND  Result Value Ref Range Status   Specimen Description BLOOD LEFT HAND  Final   Special Requests   Final    BOTTLES DRAWN AEROBIC AND ANAEROBIC Blood Culture adequate volume   Culture   Final    NO GROWTH 4 DAYS Performed at [facility], [address], [place], [place]    Report Status PENDING  Incomplete  Respiratory Panel by RT PCR (Flu A&B, Covid) - Nasopharyngeal Swab     Status: None   Collection Time: 02/15/20 12:21 AM   Specimen: Nasopharyngeal Swab  Result Value Ref Range Status   SARS Coronavirus 2 by RT PCR NEGATIVE NEGATIVE Final    Comment: (NOTE) SARS-CoV-2 target nucleic acids are NOT DETECTED. The SARS-CoV-2 RNA is generally detectable in upper respiratoy specimens during the acute phase of infection. The lowest concentration of SARS-CoV-2 viral copies this assay can detect is 131 copies/mL. A negative result  does not preclude SARS-Cov-2 infection and should not be used as the sole basis for treatment or other patient management decisions. A negative result may occur with  improper specimen collection/handling, submission of specimen other than nasopharyngeal swab, presence of viral mutation(s) within the areas targeted by this assay, and inadequate number of viral copies (<131 copies/mL). A negative result must be combined with clinical observations, patient history, and epidemiological information. The expected result is Negative. Fact Sheet for Patients:  04/16/20 Fact Sheet for Healthcare Providers:  https://www.moore.com/ This test is not yet ap proved or cleared by the https://www.young.biz/ FDA and  has been authorized for detection  and/or diagnosis of SARS-CoV-2 by FDA under an Emergency Use Authorization (EUA). This EUA will remain  in effect (meaning this test can be used) for the duration of the COVID-19 declaration under Section 564(b)(1) of the Act, 21 U.S.C. section 360bbb-3(b)(1), unless the authorization is terminated or revoked sooner.    Influenza A by PCR NEGATIVE NEGATIVE Final   Influenza B by PCR NEGATIVE NEGATIVE Final    Comment: (NOTE) The Xpert Xpress SARS-CoV-2/FLU/RSV assay is intended as an aid in  the diagnosis of influenza from Nasopharyngeal swab specimens and  should not be used as a sole basis for treatment. Nasal washings and  aspirates are unacceptable for Xpert Xpress SARS-CoV-2/FLU/RSV  testing. Fact Sheet for Patients: https://www.moore.com/ Fact Sheet for Healthcare Providers: https://www.young.biz/ This test is not yet approved or cleared by the [place] FDA and  has been authorized for detection and/or diagnosis of SARS-CoV-2 by  FDA under an Emergency Use Authorization (EUA). This EUA will remain  in effect (meaning this test can be used) for the duration of  the  Covid-19 declaration under Section 564(b)(1) of the Act, 21  U.S.C. section 360bbb-3(b)(1), unless the authorization is  terminated or revoked. Performed at [facility], [address], [place], [place] [address]   MRSA PCR Screening     Status: None   Collection Time: 02/15/20  2:49 AM   Specimen: Nasal Mucosa; Nasopharyngeal  Result Value Ref Range Status   MRSA by PCR NEGATIVE NEGATIVE Final    Comment:        The GeneXpert MRSA Assay (FDA approved for NASAL specimens only), is one component of a comprehensive MRSA colonization surveillance program. It is not intended to diagnose MRSA infection nor to guide or monitor treatment for MRSA infections. Performed at [facility], [address], [place], [place] [address]      Labs: BNP (last 3 results) No results for input(s): BNP in the last 8760 hours. Basic Metabolic Panel: Recent Labs  Lab 02/14/20 2031 02/14/20 2354 02/15/20 0708 02/16/20 0735 02/17/20 0558 02/18/20 0534  NA 135  --  140 136 135 135  K 4.3  --  4.1 3.4* 3.8 3.6  CL 108  --  112* 106 107 104  CO2 19*  --  22 24 16* 23  GLUCOSE 98  --  92 103* 65* 87  BUN 18  --  15 11 7* 5*  CREATININE 0.99  --  0.79 0.79 0.65 0.55*  CALCIUM 7.8*  --  7.7* 7.9* 7.9* 8.0*  MG  --  1.6*  --  1.9  --  1.7  PHOS  --  3.3  --   --   --   --    Liver Function Tests: Recent Labs  Lab 02/14/20 2031 02/15/20 0708 02/16/20 0735  AST ALT ALKPHOS 82 83 92  BILITOT 0.5 0.7 0.8  PROT 5.5* 5.5* 5.7*  ALBUMIN 1.9* 1.8* 1.8*   No results for input(s): LIPASE, AMYLASE in the last 168 hours. No results for input(s): AMMONIA in the last 168 hours. CBC: Recent Labs  Lab 02/14/20 2031 02/15/20 0708 02/16/20 0735 02/17/20 0558 02/18/20 0534  WBC 20.3* 23.6* 23.0* 17.8* 11.5*  NEUTROABS 18.7*  --   --   --   --   HGB 3.7* 6.7* 9.6* 9.5* 10.0*  HCT 13.3* 22.2* 30.2* 31.4* 33.2*  MCV 70.0* 75.8* 77.2* 79.1* 80.0  PLT 276 244 235 190 180  Cardiac Enzymes: No results for input(s): CKTOTAL, CKMB, CKMBINDEX, TROPONINI in the last 168 hours. BNP: Invalid input(s): POCBNP CBG: No results for input(s): GLUCAP in the last 168 hours. D-Dimer No results for input(s): DDIMER in the last 72 hours. Hgb A1c No results for input(s): HGBA1C in the last 72 hours. Lipid Profile No results for input(s): CHOL, HDL, LDLCALC, TRIG, CHOLHDL, LDLDIRECT in the last 72 hours. Thyroid function studies No results for input(s): TSH, T4TOTAL, T3FREE, THYROIDAB in the last 72 hours.  Invalid input(s): FREET3 Anemia work up No results for input(s): VITAMINB12, FOLATE, FERRITIN, TIBC, IRON, RETICCTPCT in the last 72 hours. Urinalysis    Component Value Date/Time   COLORURINE YELLOW 02/18/2020 0924   APPEARANCEUR CLEAR 02/18/2020 0924   LABSPEC 1.006 02/18/2020 0924   PHURINE 7.0 02/18/2020 0924   GLUCOSEU NEGATIVE 02/18/2020 0924   HGBUR NEGATIVE 02/18/2020 0924   BILIRUBINUR NEGATIVE 02/18/2020 0924   KETONESUR NEGATIVE 02/18/2020 0924   PROTEINUR NEGATIVE 02/18/2020 0924   NITRITE NEGATIVE 02/18/2020 0924   LEUKOCYTESUR NEGATIVE 02/18/2020 0924   Sepsis Labs Invalid input(s): PROCALCITONIN,  WBC,  LACTICIDVEN Microbiology Recent Results (from the past 240 hour(s))  Blood Culture (routine x 2)     Status: None (Preliminary result)   Collection Time: 02/14/20  9:55 PM   Specimen: BLOOD  Result Value Ref Range Status   Specimen Description BLOOD LEFT ANTECUBITAL  Final   Special Requests   Final    BOTTLES DRAWN AEROBIC AND ANAEROBIC Blood Culture adequate volume   Culture   Final    NO GROWTH 4 DAYS Performed at [facility], [address], [place], [place] [address]    Report Status PENDING  Incomplete  Blood Culture (routine x 2)     Status: None (Preliminary result)   Collection Time: 02/14/20  9:55 PM   Specimen: BLOOD LEFT HAND  Result Value Ref Range Status   Specimen Description BLOOD LEFT HAND  Final   Special  Requests   Final    BOTTLES DRAWN AEROBIC AND ANAEROBIC Blood Culture adequate volume   Culture   Final    NO GROWTH 4 DAYS Performed at [facility], [address], [place], [place] [address]    Report Status PENDING  Incomplete  Respiratory Panel by RT PCR (Flu A&B, Covid) - Nasopharyngeal Swab     Status: None   Collection Time: 02/15/20 12:21 AM   Specimen: Nasopharyngeal Swab  Result Value Ref Range Status   SARS Coronavirus 2 by RT PCR NEGATIVE NEGATIVE Final    Comment: (NOTE) SARS-CoV-2 target nucleic acids are NOT DETECTED. The SARS-CoV-2 RNA is generally detectable in upper respiratoy specimens during the acute phase of infection. The lowest concentration of SARS-CoV-2 viral copies this assay can detect is 131 copies/mL. A negative result does not preclude SARS-Cov-2 infection and should not be used as the sole basis for treatment or other patient management decisions. A negative result may occur with  improper specimen collection/handling, submission of specimen other than nasopharyngeal swab, presence of viral mutation(s) within the areas targeted by this assay, and inadequate number of viral copies (<131 copies/mL). A negative result must be combined with clinical observations, patient history, and epidemiological information. The expected result is Negative. Fact Sheet for Patients:  https://www.moore.com/ Fact Sheet for Healthcare Providers:  https://www.young.biz/ This test is not yet ap proved or cleared by the [place] FDA and  has been authorized for detection and/or diagnosis of SARS-CoV-2 by FDA under an Emergency Use Authorization (  EUA). This EUA will remain  in effect (meaning this test can be used) for the duration of the COVID-19 declaration under Section 564(b)(1) of the Act, 21 U.S.C. section 360bbb-3(b)(1), unless the authorization is terminated or revoked sooner.    Influenza A by PCR NEGATIVE NEGATIVE Final    Influenza B by PCR NEGATIVE NEGATIVE Final    Comment: (NOTE) The Xpert Xpress SARS-CoV-2/FLU/RSV assay is intended as an aid in  the diagnosis of influenza from Nasopharyngeal swab specimens and  should not be used as a sole basis for treatment. Nasal washings and  aspirates are unacceptable for Xpert Xpress SARS-CoV-2/FLU/RSV  testing. Fact Sheet for Patients: https://www.moore.com/ Fact Sheet for Healthcare Providers: https://www.young.biz/ This test is not yet approved or cleared by the [place] FDA and  has been authorized for detection and/or diagnosis of SARS-CoV-2 by  FDA under an Emergency Use Authorization (EUA). This EUA will remain  in effect (meaning this test can be used) for the duration of the  Covid-19 declaration under Section 564(b)(1) of the Act, 21  U.S.C. section 360bbb-3(b)(1), unless the authorization is  terminated or revoked. Performed at [facility], [address] [place], [place] [address]   MRSA PCR Screening     Status: None   Collection Time: 02/15/20  2:49 AM   Specimen: Nasal Mucosa; Nasopharyngeal  Result Value Ref Range Status   MRSA by PCR NEGATIVE NEGATIVE Final    Comment:        The GeneXpert MRSA Assay (FDA approved for NASAL specimens only), is one component of a comprehensive MRSA colonization surveillance program. It is not intended to diagnose MRSA infection nor to guide or monitor treatment for MRSA infections. Performed at [facility], [address] [place], [place] [address]    Time coordinating discharge: 31 minutes   SIGNED:  Standley Dakins, MD  [facility] 02/18/2020, 3:50 PM How to contact the [facility] Attending or Consulting provider 7A - 7P or covering provider during after hours 7P -7A, for this patient?  1. Check the care team in [facility] and look for a) attending/consulting [facility] provider listed and b) the [facility] team listed 2. Log into [email or web address] and use [facility]  universal password to access. If you do not have the password, please contact the hospital operator. 3. Locate the [facility] provider you are looking for under [facility] and page to a number that you can be directly reached. 4. If you still have difficulty reaching the provider, please page the [facility] (Director on Call) for the Hospitalists listed on amion for assistance.

## 2020-02-18 NOTE — Transfer of Care (Signed)
Immediate Anesthesia Transfer of Care Note  Patient: Spencer Hickman  Procedure(s) Performed: ESOPHAGOGASTRODUODENOSCOPY (EGD) WITH PROPOFOL (N/A ) COLONOSCOPY WITH PROPOFOL (N/A ) BIOPSY  Patient Location: PACU  Anesthesia Type:MAC  Level of Consciousness: awake and patient cooperative  Airway & Oxygen Therapy: Patient Spontanous Breathing  Post-op Assessment: Report given to RN and Post -op Vital signs reviewed and stable  Post vital signs: Reviewed and stable  Last Vitals:  Vitals Value Taken Time  BP 82/61 02/18/20 1128  Temp    Pulse 98 02/18/20 1130  Resp 20 02/18/20 1130  SpO2 99 % 02/18/20 1130  Vitals shown include unvalidated device data.  Last Pain:  Vitals:   02/18/20 1053  TempSrc:   PainSc: 0-No pain         Complications: No apparent anesthesia complications

## 2020-02-18 NOTE — Care Management Important Message (Signed)
Important Message  Patient Details  Name: Spencer Hickman MRN: [id number] Date of Birth: 1956/05/02   Medicare Important Message Given:  Yes     Corey Harold 02/18/2020, 12:12 PM

## 2020-02-18 NOTE — Discharge Instructions (Signed)

## 2020-02-18 NOTE — Anesthesia Postprocedure Evaluation (Signed)
Anesthesia Post Note  Patient: Spencer Hickman  Procedure(s) Performed: ESOPHAGOGASTRODUODENOSCOPY (EGD) WITH PROPOFOL (N/A ) COLONOSCOPY WITH PROPOFOL (N/A ) BIOPSY  Patient location during evaluation: PACU Anesthesia Type: MAC Level of consciousness: awake and alert and patient cooperative Pain management: satisfactory to patient Vital Signs Assessment: post-procedure vital signs reviewed and stable Respiratory status: spontaneous breathing Cardiovascular status: stable Postop Assessment: no apparent nausea or vomiting Anesthetic complications: no     Last Vitals:  Vitals:   02/18/20 1014 02/18/20 1131  BP: 127/77 (!) 82/61  Pulse: 91 98  Resp: 20 20  Temp: 36.6 C   SpO2: 99% 99%    Last Pain:  Vitals:   02/18/20 1053  TempSrc:   PainSc: 0-No pain                 Vassie Kugel

## 2020-02-18 NOTE — Evaluation (Signed)
Physical Therapy Evaluation Patient Details Name: Spencer Hickman MRN: [id number] DOB: 1956/08/04 Today's Date: 02/18/2020   History of Present Illness  Spencer Hickman  is a 64 y.o. male, with a history of COPD, CAD, current use, tobacco abuse, chronic anemia, came to hospital with intermittent complaints of headache with chest pain for past 3 weeks.  He also has been having difficulty with breathing.  He denies specific head trauma.  Patient drinks 6 pack of beer per day.  He denies fever, chills, no visual disturbance, no numbness or weakness.  Also complains of intermittent chest pain with shortness of breath.    Clinical Impression  Patient functioning at baseline for functional mobility and gait.  Plan:  Patient discharged from physical therapy to care of nursing for ambulation daily as tolerated for length of stay.     Follow Up Recommendations No PT follow up    Equipment Recommendations  None recommended by PT    Recommendations for Other Services       Precautions / Restrictions Precautions Precautions: None Restrictions Weight Bearing Restrictions: No      Mobility  Bed Mobility Overal bed mobility: Independent                Transfers Overall transfer level: Modified independent               General transfer comment: slightly increased time  Ambulation/Gait Ambulation/Gait assistance: Modified independent (Device/Increase time) Gait Distance (Feet): 150 Feet Assistive device: None Gait Pattern/deviations: WFL(Within Functional Limits) Gait velocity: slightly decreased   General Gait Details: demonstrates good return for ambulation in room and hallways without loss of balance  Stairs            Wheelchair Mobility    Modified Rankin (Stroke Patients Only)       Balance Overall balance assessment: No apparent balance deficits (not formally assessed)                                           Pertinent  Vitals/Pain Pain Assessment: No/denies pain    Home Living Family/patient expects to be discharged to:: Private residence Living Arrangements: Alone Available Help at Discharge: Neighbor;Friend(s);Available PRN/intermittently Type of Home: House Home Access: Stairs to enter   Entrance Stairs-Number of Steps: 1 Home Layout: One level Home Equipment: Walker - 2 wheels;Cane - single point Additional Comments: has friends that live in a camper next door to his house on the same lot    Prior Function Level of Independence: Needs assistance   Gait / Transfers Assistance Needed: household and short distanced community ambulator without AD  ADL's / Homemaking Assistance Needed: community ADLs assisted by friends/neighbors        Hand Dominance        Extremity/Trunk Assessment   Upper Extremity Assessment Upper Extremity Assessment: Overall WFL for tasks assessed    Lower Extremity Assessment Lower Extremity Assessment: Overall WFL for tasks assessed    Cervical / Trunk Assessment Cervical / Trunk Assessment: Normal  Communication   Communication: No difficulties  Cognition Arousal/Alertness: Awake/alert Behavior During Therapy: WFL for tasks assessed/performed Overall Cognitive Status: Within Functional Limits for tasks assessed  General Comments      Exercises     Assessment/Plan    PT Assessment Patent does not need any further PT services  PT Problem List         PT Treatment Interventions      PT Goals (Current goals can be found in the Care Plan section)  Acute Rehab PT Goals Patient Stated Goal: return home PT Goal Formulation: With patient Time For Goal Achievement: 02/18/20 Potential to Achieve Goals: Good    Frequency     Barriers to discharge        Co-evaluation               AM-PAC PT "6 Clicks" Mobility  Outcome Measure Help needed turning from your back to your side while  in a flat bed without using bedrails?: None Help needed moving from lying on your back to sitting on the side of a flat bed without using bedrails?: None Help needed moving to and from a bed to a chair (including a wheelchair)?: None Help needed standing up from a chair using your arms (e.g., wheelchair or bedside chair)?: None Help needed to walk in hospital room?: None Help needed climbing 3-5 steps with a railing? : None 6 Click Score: 24    End of Session   Activity Tolerance: Patient tolerated treatment well Patient left: in chair;with call bell/phone within reach Nurse Communication: Mobility status PT Visit Diagnosis: Unsteadiness on feet (R26.81);Other abnormalities of gait and mobility (R26.89);Muscle weakness (generalized) (M62.81)    Time: 4540-9811 PT Time Calculation (min) (ACUTE ONLY): 15 min   Charges:   PT Evaluation $PT Eval Low Complexity: 1 Low PT Treatments $Therapeutic Activity: 8-22 mins        12:17 PM, 02/18/20 Lonell Grandchild, MPT Physical Therapist with [facility] [phone] office [phone] mobile phone

## 2020-02-18 NOTE — Anesthesia Procedure Notes (Signed)
Date/Time: 02/18/2020 10:51 AM Performed by: Franco Nones, CRNA Pre-anesthesia Checklist: Patient identified, Emergency Drugs available, Suction available, Timeout performed and Patient being monitored Patient Re-evaluated:Patient Re-evaluated prior to induction Oxygen Delivery Method: Non-rebreather mask

## 2020-02-18 NOTE — H&P (View-Only) (Signed)
Reviewed chart. Colonoscopy/EGD as per plan with Propofol. Patient is NPO. Completed prep with good results. Discussed risks and benefits again with patient, who stated understanding and desires to proceed. Hgb 10 today.

## 2020-02-18 NOTE — Progress Notes (Signed)
Reviewed chart. Colonoscopy/EGD as per plan with Propofol. Patient is NPO. Completed prep with good results. Discussed risks and benefits again with patient, who stated understanding and desires to proceed. Hgb 10 today.  

## 2020-02-18 NOTE — Clinical Social Work Note (Signed)
Provided patient with a list of primary care providers in [facility] accepting new patients. Joanne Chars, CMA, left a message for [name], NP, at [facility] attempting to schedule patient with provider.     Spencer Hickman, Spencer China, LCSW

## 2020-02-18 NOTE — Clinical Social Work Note (Signed)
RN messaged and advised that relative wanted to speak with TOC. Unsure which relative. Call made to daughter, Toni Amend, message left. Other daughter listed on chart's number is not currently working (fast busy signal).    Spencer Hickman, Spencer China, LCSW

## 2020-02-18 NOTE — Op Note (Addendum)
[facility] Patient Name: Spencer Hickman Procedure Date: 02/18/2020 10:51 AM MRN: [id number] Date of Birth: [birth year] Attending MD: [name] , MD CSN: [id number] Age: 64 Admit Type: Inpatient Procedure:                Upper GI endoscopy Indications:              Iron deficiency anemia, Weight loss Providers:                [name], MD, Loma Messing B. Patsy Lager, RN,                            Edythe Clarity, Technician Referring MD:              Medicines:                Propofol per Anesthesia Complications:            No immediate complications. Estimated Blood Loss:     Estimated blood loss was minimal. Procedure:                Pre-Anesthesia Assessment:                           - Prior to the procedure, a History and Physical                            was performed, and patient medications and                            allergies were reviewed. The patient's tolerance of                            previous anesthesia was also reviewed. The risks                            and benefits of the procedure and the sedation                            options and risks were discussed with the patient.                            All questions were answered, and informed consent                            was obtained. Prior Anticoagulants: The patient has                            taken no previous anticoagulant or antiplatelet                            agents. ASA Grade Assessment: III - A patient with                            severe systemic disease. After reviewing the risks  and benefits, the patient was deemed in                            satisfactory condition to undergo the procedure.                           After obtaining informed consent, the endoscope was                            passed under direct vision. Throughout the                            procedure, the patient's blood pressure, pulse, and        oxygen saturations were monitored continuously. The                            [id number]-[id number] ([id number]) scope was introduced through the                            mouth, and advanced to the second part of duodenum.                            The upper GI endoscopy was accomplished without                            difficulty. The patient tolerated the procedure                            well. Scope In: 10:57:49 AM Scope Out: 11:02:15 AM Total Procedure Duration: 0 hours 4 minutes 26 seconds  Findings:      The examined esophagus was normal.      Diffuse erythematous mucosa was found in the entire examined stomach.       Focal antral erosions present. 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 No ulcer or infiltrating process. No stigmata of       portal hypertension. This was biopsied with a cold forceps for       histology. Estimated blood loss was minimal.      The duodenal bulb and second portion of the duodenum were normal. Impression:               - Normal esophagus.                           - Erythematous /eroded gastric mucosa in the                            stomach. Innocent during AVM. Status post gastric                            biopsy- Normal duodenal bulb and second portion of                            the duodenum. Moderate Sedation:      Moderate (conscious) sedation was  personally administered by an       anesthesia professional. The following parameters were monitored: oxygen       saturation, heart rate, blood pressure, respiratory rate, EKG, adequacy       of pulmonary ventilation, and response to care. Recommendation:           - Patient has a contact number available for                            emergencies. The signs and symptoms of potential                            delayed complications were discussed with the                            patient. Return to normal activities tomorrow.                            Written discharge  instructions were provided to the                            patient.                           - Resume regular diet.                           - Continue present medications.                           - Return patient to hospital ward for observation.                            Low up on pathology                           - Continue present medications. Colonoscopy report.                            Abstain from alcohol. nutrtional support. See                            colonoscopy report Procedure Code(s):        --- Professional ---                           [phone], Esophagogastroduodenoscopy, flexible,                            transoral; with biopsy, single or multiple Diagnosis Code(s):        --- Professional ---                           K31.89, Other diseases of stomach and duodenum                           D50.9, Iron deficiency anemia, unspecified  R63.4, Abnormal weight loss CPT copyright 2019 [employer]. All rights reserved. The codes documented in this report are preliminary and upon coder review may  be revised to meet current compliance requirements. Spencer Hickman. Spencer Desilva, MD [name], MD 02/18/2020 11:23:54 AM This report has been signed electronically. Number of Addenda: 0

## 2020-02-18 NOTE — Op Note (Signed)
[facility] Patient Name: Spencer Hickman Procedure Date: 02/18/2020 11:02 AM MRN: [id number] Date of Birth: 07-14-1956 Attending MD: Norvel Richards , MD CSN: [id number] Age: 64 Admit Type: Inpatient Procedure:                Colonoscopy Indications:              Iron deficiency anemia, Weight loss Providers:                Norvel Richards, MD, Gwenlyn Fudge RN, RN,                            Nelma Rothman, Technician Referring MD:              Medicines:                Propofol per Anesthesia Complications:            No immediate complications. Estimated Blood Loss:     Estimated blood loss: none. Procedure:                Pre-Anesthesia Assessment:                           - Prior to the procedure, a History and Physical                            was performed, and patient medications and                            allergies were reviewed. The patient's tolerance of                            previous anesthesia was also reviewed. The risks                            and benefits of the procedure and the sedation                            options and risks were discussed with the patient.                            All questions were answered, and informed consent                            was obtained. Prior Anticoagulants: The patient has                            taken no previous anticoagulant or antiplatelet                            agents. ASA Grade Assessment: III - A patient with                            severe systemic disease. After reviewing the risks  and benefits, the patient was deemed in                            satisfactory condition to undergo the procedure.                           After obtaining informed consent, the colonoscope                            was passed under direct vision. Throughout the                            procedure, the patient's blood pressure, pulse, and                            oxygen  saturations were monitored continuously. The                            [id number]-[id number] ([id number]) scope was introduced through                            the anus and advanced to the the cecum, identified                            by appendiceal orifice and ileocecal valve. The                            ileocecal valve, appendiceal orifice, and rectum                            were photographed. The entire colon was well                            visualized. Scope In: 11:08:20 AM Scope Out: 11:21:48 AM Scope Withdrawal Time: 0 hours 9 minutes 0 seconds  Total Procedure Duration: 0 hours 13 minutes 28 seconds  Findings:      The perianal and digital rectal examinations were normal.      The colon appeared normal. Normal retroflexion. Impression:               - The entire examined colon is normal.                           - No specimens collected. Moderate Sedation:      Moderate (conscious) sedation was personally administered by an       anesthesia professional. The following parameters were monitored: oxygen       saturation, heart rate, blood pressure, respiratory rate, EKG, adequacy       of pulmonary ventilation, and response to care. Recommendation:           - Return patient to hospital ward for observation.                           - Advance diet as tolerated.                           -  Continue present medications.                           - Repeat colonoscopy in 10 years for screening                            purposes.                           - Return to GI office (date not yet determined).                            See EGD report. Alcohol abstinence. Good                            nutritional support. From a GI standpoint patient,                            can be discharged any time. Procedure Code(s):        --- Professional ---                           [phone], Colonoscopy, flexible; diagnostic, including                            collection of specimen(s) by  brushing or washing,                            when performed (separate procedure) Diagnosis Code(s):        --- Professional ---                           D50.9, Iron deficiency anemia, unspecified                           R63.4, Abnormal weight loss CPT copyright 2019 [employer] Association. All rights reserved. The codes documented in this report are preliminary and upon coder review may  be revised to meet current compliance requirements. [name]. [name], MD [name], MD 02/18/2020 11:27:17 AM This report has been signed electronically. Number of Addenda: 0

## 2020-02-19 ENCOUNTER — Encounter: Payer: Self-pay | Admitting: Internal Medicine

## 2020-02-19 ENCOUNTER — Other Ambulatory Visit: Payer: Self-pay

## 2020-02-19 LAB — URINE CULTURE: Culture: NO GROWTH

## 2020-02-19 LAB — CULTURE, BLOOD (ROUTINE X 2)
Culture: NO GROWTH
Culture: NO GROWTH
Special Requests: ADEQUATE
Special Requests: ADEQUATE

## 2020-02-19 LAB — SURGICAL PATHOLOGY

## 2020-03-10 DIAGNOSIS — G43909 Migraine, unspecified, not intractable, without status migrainosus: Secondary | ICD-10-CM | POA: Diagnosis not present

## 2020-03-10 DIAGNOSIS — J449 Chronic obstructive pulmonary disease, unspecified: Secondary | ICD-10-CM | POA: Diagnosis not present

## 2020-03-10 DIAGNOSIS — D649 Anemia, unspecified: Secondary | ICD-10-CM | POA: Diagnosis not present

## 2020-03-10 DIAGNOSIS — Z681 Body mass index (BMI) 19 or less, adult: Secondary | ICD-10-CM | POA: Diagnosis not present

## 2020-03-10 DIAGNOSIS — G4700 Insomnia, unspecified: Secondary | ICD-10-CM | POA: Diagnosis not present

## 2020-04-16 DIAGNOSIS — Z125 Encounter for screening for malignant neoplasm of prostate: Secondary | ICD-10-CM | POA: Diagnosis not present

## 2020-04-16 DIAGNOSIS — Z681 Body mass index (BMI) 19 or less, adult: Secondary | ICD-10-CM | POA: Diagnosis not present

## 2020-04-16 DIAGNOSIS — G43909 Migraine, unspecified, not intractable, without status migrainosus: Secondary | ICD-10-CM | POA: Diagnosis not present

## 2020-04-16 DIAGNOSIS — Z0000 Encounter for general adult medical examination without abnormal findings: Secondary | ICD-10-CM | POA: Diagnosis not present

## 2020-04-16 DIAGNOSIS — J449 Chronic obstructive pulmonary disease, unspecified: Secondary | ICD-10-CM | POA: Diagnosis not present

## 2020-04-16 DIAGNOSIS — D649 Anemia, unspecified: Secondary | ICD-10-CM | POA: Diagnosis not present

## 2020-04-16 DIAGNOSIS — G4700 Insomnia, unspecified: Secondary | ICD-10-CM | POA: Diagnosis not present

## 2020-07-17 DIAGNOSIS — Z681 Body mass index (BMI) 19 or less, adult: Secondary | ICD-10-CM | POA: Diagnosis not present

## 2020-07-17 DIAGNOSIS — G43909 Migraine, unspecified, not intractable, without status migrainosus: Secondary | ICD-10-CM | POA: Diagnosis not present

## 2020-07-17 DIAGNOSIS — Z0000 Encounter for general adult medical examination without abnormal findings: Secondary | ICD-10-CM | POA: Diagnosis not present

## 2020-07-17 DIAGNOSIS — J449 Chronic obstructive pulmonary disease, unspecified: Secondary | ICD-10-CM | POA: Diagnosis not present

## 2020-07-17 DIAGNOSIS — D649 Anemia, unspecified: Secondary | ICD-10-CM | POA: Diagnosis not present

## 2020-07-17 DIAGNOSIS — K219 Gastro-esophageal reflux disease without esophagitis: Secondary | ICD-10-CM | POA: Diagnosis not present

## 2020-07-17 DIAGNOSIS — G4700 Insomnia, unspecified: Secondary | ICD-10-CM | POA: Diagnosis not present

## 2020-11-03 DIAGNOSIS — D649 Anemia, unspecified: Secondary | ICD-10-CM | POA: Diagnosis not present

## 2020-11-03 DIAGNOSIS — G43909 Migraine, unspecified, not intractable, without status migrainosus: Secondary | ICD-10-CM | POA: Diagnosis not present

## 2020-11-03 DIAGNOSIS — J449 Chronic obstructive pulmonary disease, unspecified: Secondary | ICD-10-CM | POA: Diagnosis not present

## 2020-11-03 DIAGNOSIS — K219 Gastro-esophageal reflux disease without esophagitis: Secondary | ICD-10-CM | POA: Diagnosis not present

## 2020-11-03 DIAGNOSIS — G4700 Insomnia, unspecified: Secondary | ICD-10-CM | POA: Diagnosis not present

## 2020-11-03 DIAGNOSIS — Z681 Body mass index (BMI) 19 or less, adult: Secondary | ICD-10-CM | POA: Diagnosis not present

## 2020-11-03 DIAGNOSIS — Z0000 Encounter for general adult medical examination without abnormal findings: Secondary | ICD-10-CM | POA: Diagnosis not present

## 2020-11-13 DIAGNOSIS — J449 Chronic obstructive pulmonary disease, unspecified: Secondary | ICD-10-CM | POA: Diagnosis not present

## 2020-11-13 DIAGNOSIS — K219 Gastro-esophageal reflux disease without esophagitis: Secondary | ICD-10-CM | POA: Diagnosis not present

## 2020-11-13 DIAGNOSIS — D649 Anemia, unspecified: Secondary | ICD-10-CM | POA: Diagnosis not present

## 2021-02-03 DIAGNOSIS — Z681 Body mass index (BMI) 19 or less, adult: Secondary | ICD-10-CM | POA: Diagnosis not present

## 2021-02-03 DIAGNOSIS — D649 Anemia, unspecified: Secondary | ICD-10-CM | POA: Diagnosis not present

## 2021-02-03 DIAGNOSIS — K219 Gastro-esophageal reflux disease without esophagitis: Secondary | ICD-10-CM | POA: Diagnosis not present

## 2021-02-03 DIAGNOSIS — G43909 Migraine, unspecified, not intractable, without status migrainosus: Secondary | ICD-10-CM | POA: Diagnosis not present

## 2021-02-03 DIAGNOSIS — G4700 Insomnia, unspecified: Secondary | ICD-10-CM | POA: Diagnosis not present

## 2021-02-03 DIAGNOSIS — J449 Chronic obstructive pulmonary disease, unspecified: Secondary | ICD-10-CM | POA: Diagnosis not present

## 2021-05-18 DIAGNOSIS — J449 Chronic obstructive pulmonary disease, unspecified: Secondary | ICD-10-CM | POA: Diagnosis not present

## 2021-05-18 DIAGNOSIS — Z681 Body mass index (BMI) 19 or less, adult: Secondary | ICD-10-CM | POA: Diagnosis not present

## 2021-05-18 DIAGNOSIS — D649 Anemia, unspecified: Secondary | ICD-10-CM | POA: Diagnosis not present

## 2021-05-18 DIAGNOSIS — G4700 Insomnia, unspecified: Secondary | ICD-10-CM | POA: Diagnosis not present

## 2021-05-18 DIAGNOSIS — G43909 Migraine, unspecified, not intractable, without status migrainosus: Secondary | ICD-10-CM | POA: Diagnosis not present

## 2021-05-18 DIAGNOSIS — K219 Gastro-esophageal reflux disease without esophagitis: Secondary | ICD-10-CM | POA: Diagnosis not present

## 2021-05-18 DIAGNOSIS — Z125 Encounter for screening for malignant neoplasm of prostate: Secondary | ICD-10-CM | POA: Diagnosis not present

## 2021-09-01 DIAGNOSIS — G43909 Migraine, unspecified, not intractable, without status migrainosus: Secondary | ICD-10-CM | POA: Diagnosis not present

## 2021-09-01 DIAGNOSIS — G4700 Insomnia, unspecified: Secondary | ICD-10-CM | POA: Diagnosis not present

## 2021-09-01 DIAGNOSIS — D649 Anemia, unspecified: Secondary | ICD-10-CM | POA: Diagnosis not present

## 2021-09-01 DIAGNOSIS — J449 Chronic obstructive pulmonary disease, unspecified: Secondary | ICD-10-CM | POA: Diagnosis not present

## 2021-09-01 DIAGNOSIS — K219 Gastro-esophageal reflux disease without esophagitis: Secondary | ICD-10-CM | POA: Diagnosis not present

## 2021-09-01 DIAGNOSIS — Z681 Body mass index (BMI) 19 or less, adult: Secondary | ICD-10-CM | POA: Diagnosis not present

## 2021-09-04 DIAGNOSIS — J449 Chronic obstructive pulmonary disease, unspecified: Secondary | ICD-10-CM | POA: Diagnosis not present

## 2021-09-04 DIAGNOSIS — Z125 Encounter for screening for malignant neoplasm of prostate: Secondary | ICD-10-CM | POA: Diagnosis not present

## 2021-09-04 DIAGNOSIS — D649 Anemia, unspecified: Secondary | ICD-10-CM | POA: Diagnosis not present

## 2021-09-04 DIAGNOSIS — K219 Gastro-esophageal reflux disease without esophagitis: Secondary | ICD-10-CM | POA: Diagnosis not present

## 2021-09-04 DIAGNOSIS — G43909 Migraine, unspecified, not intractable, without status migrainosus: Secondary | ICD-10-CM | POA: Diagnosis not present

## 2021-09-04 DIAGNOSIS — G4700 Insomnia, unspecified: Secondary | ICD-10-CM | POA: Diagnosis not present

## 2021-10-28 IMAGING — DX DG CHEST 1V PORT
2 series · 2 of 2 positions shown · non-contrast
Comparison: 02/05/2019

CLINICAL DATA: Shortness of breath

EXAM:
PORTABLE CHEST 1 VIEW

[chest ap (1 of 2)]
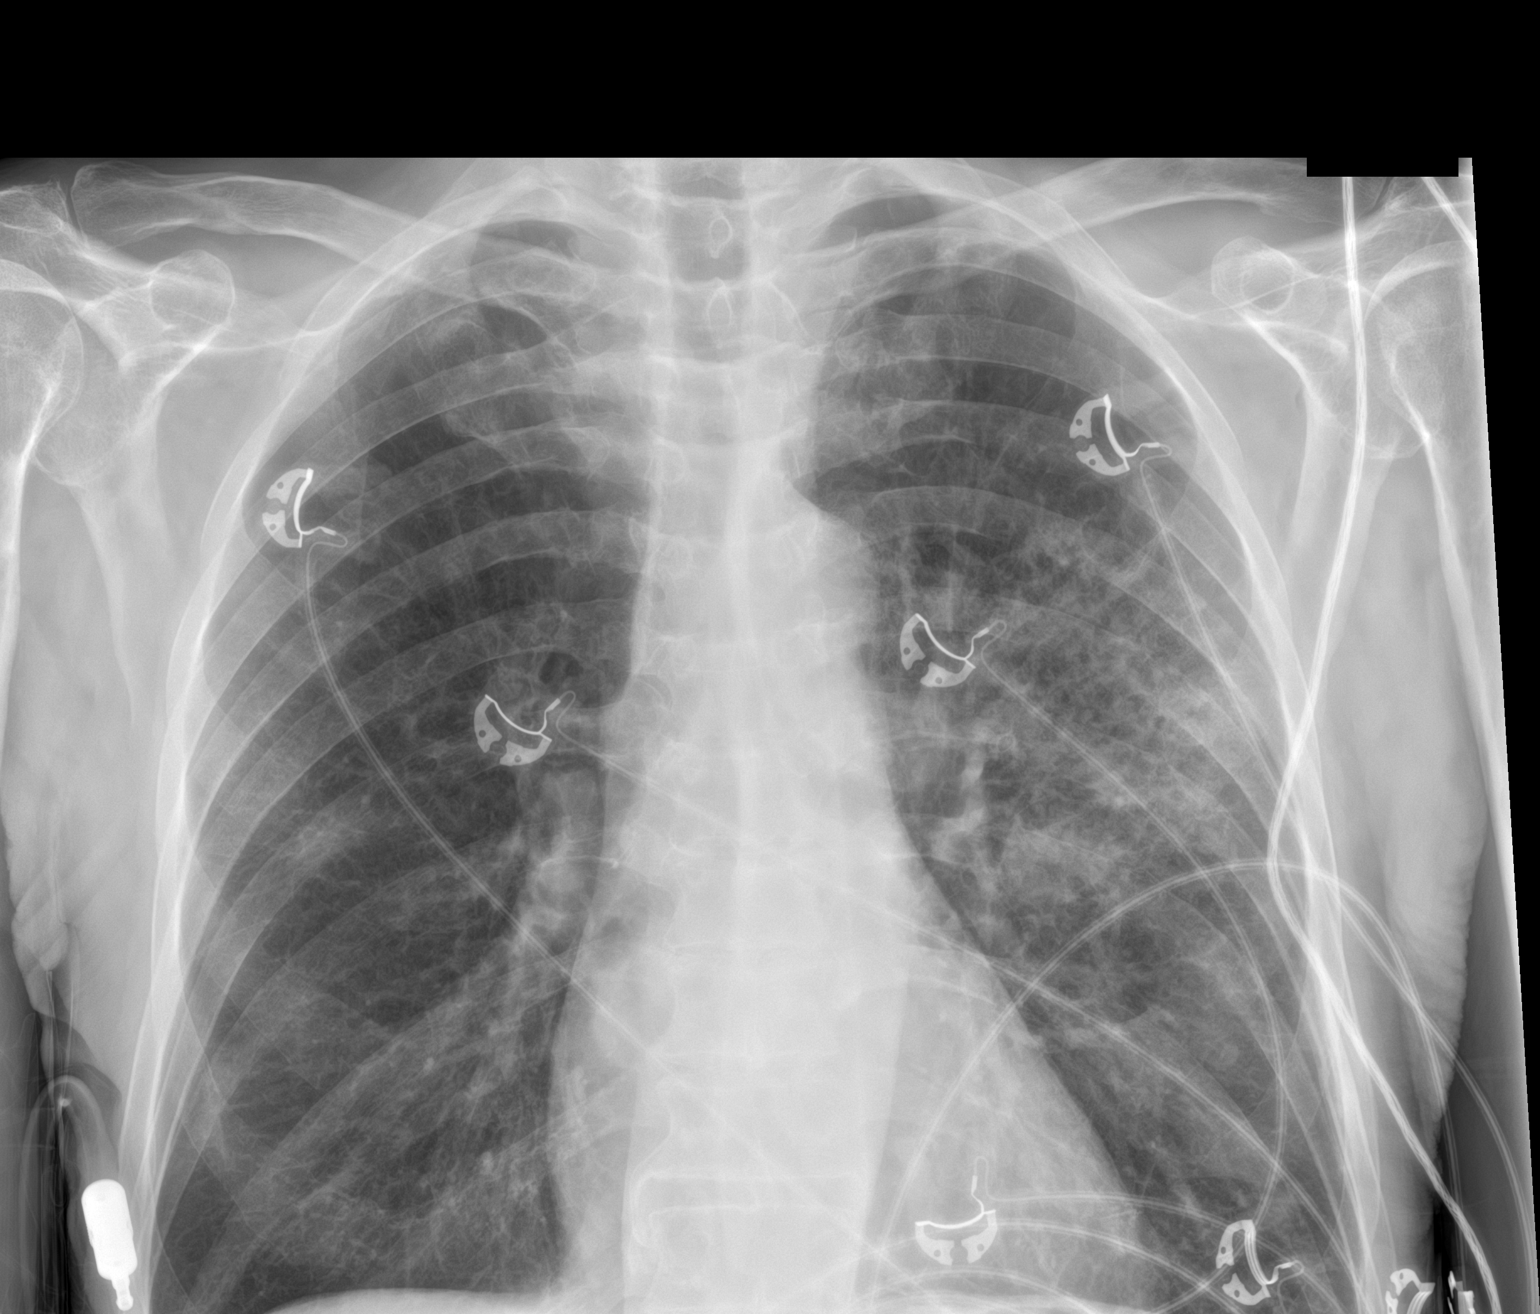

[chest ap (2 of 2)]
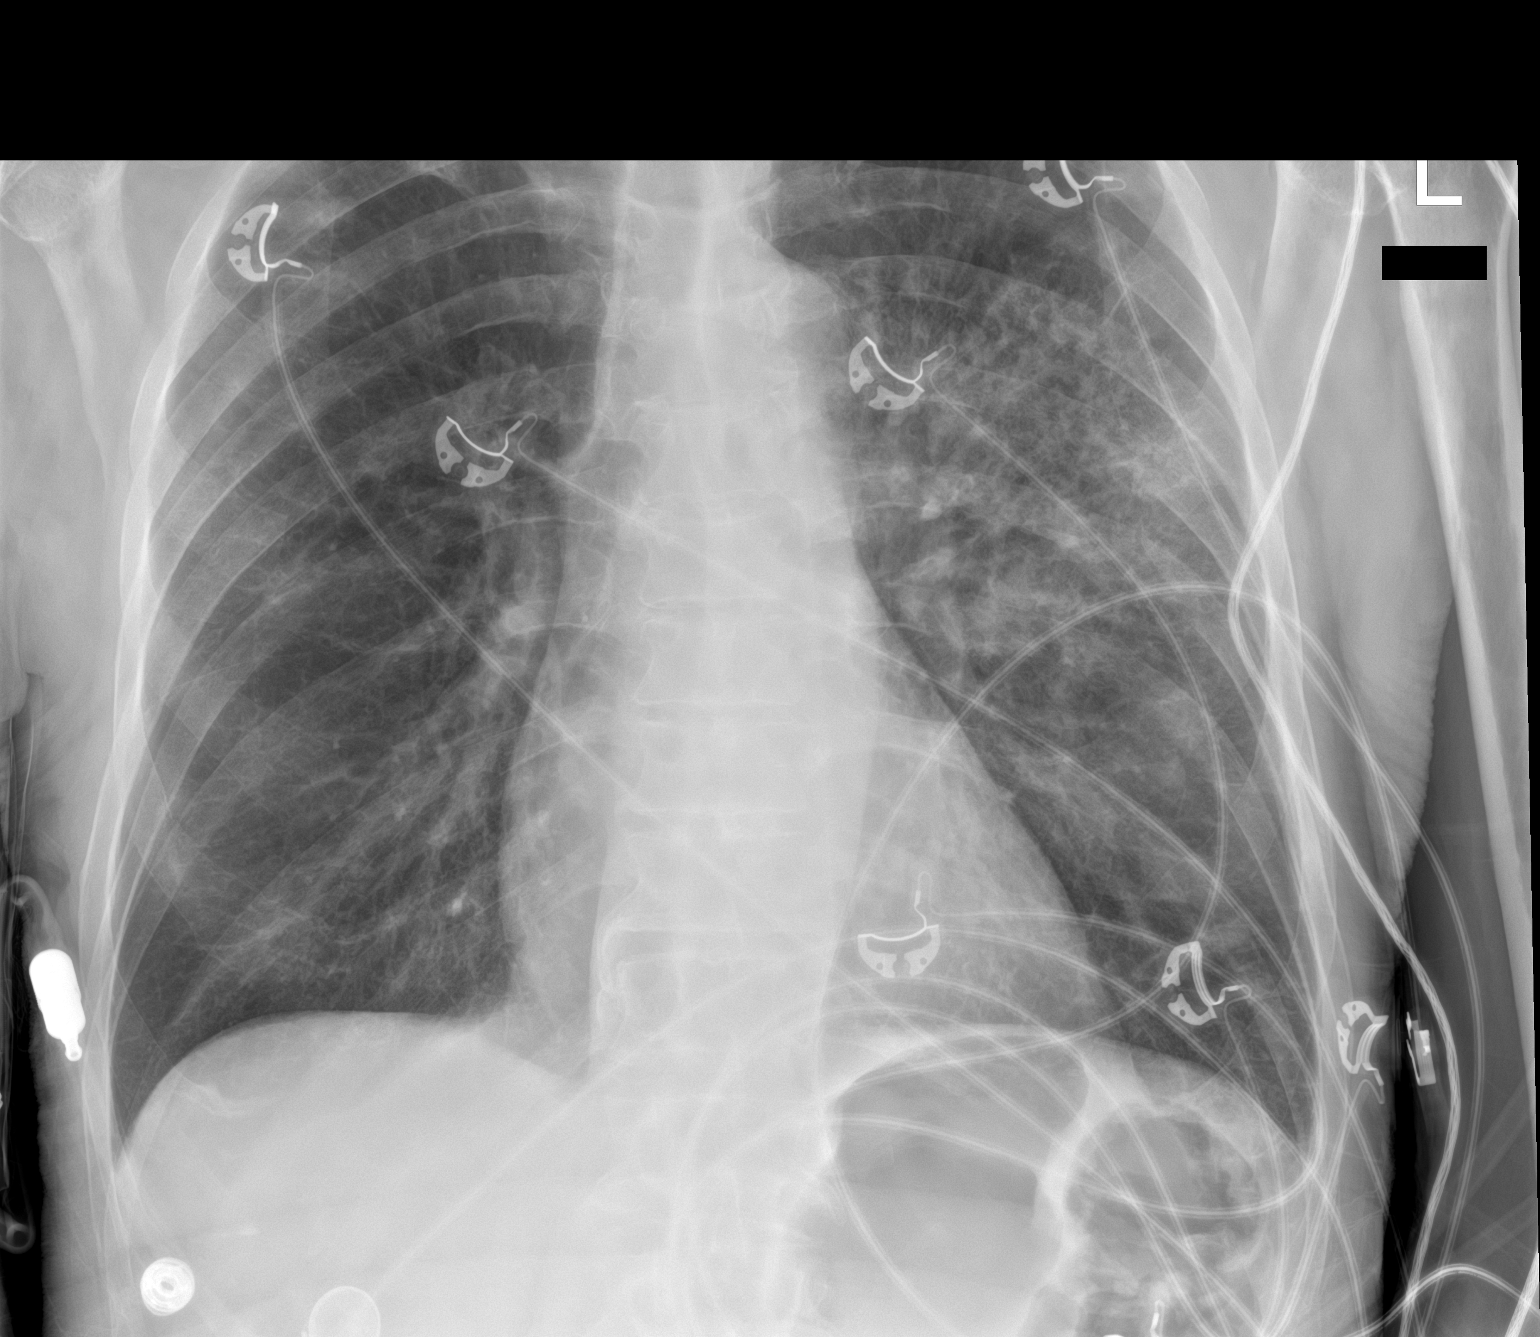

[2 of 2 positions shown; findings below may reference images not displayed]

FINDINGS: Cardiac shadow is within normal limits. The lungs are hyperinflated.
Diffuse alveolar opacity is noted in the left mid lung consistent
with acute infiltrate. This is new from the prior exam. No effusion
is seen. No bony abnormality is noted.
IMPRESSION: Left midlung infiltrate.

## 2021-12-15 ENCOUNTER — Encounter (HOSPITAL_COMMUNITY): Payer: Self-pay | Admitting: *Deleted

## 2021-12-15 ENCOUNTER — Emergency Department (HOSPITAL_COMMUNITY): Payer: PPO

## 2021-12-15 ENCOUNTER — Inpatient Hospital Stay (HOSPITAL_COMMUNITY)
Admission: EM | Admit: 2021-12-15 | Discharge: 2021-12-20 | DRG: 871 | Disposition: A | Discharge status: Home Health | Payer: PPO | Attending: Family Medicine | Admitting: Family Medicine

## 2021-12-15 DIAGNOSIS — A0472 Enterocolitis due to Clostridium difficile, not specified as recurrent: Secondary | ICD-10-CM

## 2021-12-15 DIAGNOSIS — Z79899 Other long term (current) drug therapy: Secondary | ICD-10-CM

## 2021-12-15 DIAGNOSIS — E43 Unspecified severe protein-calorie malnutrition: Secondary | ICD-10-CM | POA: Diagnosis present

## 2021-12-15 DIAGNOSIS — Z20822 Contact with and (suspected) exposure to covid-19: Secondary | ICD-10-CM | POA: Diagnosis present

## 2021-12-15 DIAGNOSIS — I82442 Acute embolism and thrombosis of left tibial vein: Secondary | ICD-10-CM | POA: Diagnosis present

## 2021-12-15 DIAGNOSIS — R197 Diarrhea, unspecified: Secondary | ICD-10-CM | POA: Diagnosis present

## 2021-12-15 DIAGNOSIS — J9601 Acute respiratory failure with hypoxia: Secondary | ICD-10-CM | POA: Diagnosis present

## 2021-12-15 DIAGNOSIS — T17908A Unspecified foreign body in respiratory tract, part unspecified causing other injury, initial encounter: Secondary | ICD-10-CM | POA: Diagnosis not present

## 2021-12-15 DIAGNOSIS — I5033 Acute on chronic diastolic (congestive) heart failure: Secondary | ICD-10-CM | POA: Diagnosis present

## 2021-12-15 DIAGNOSIS — E860 Dehydration: Secondary | ICD-10-CM

## 2021-12-15 DIAGNOSIS — E876 Hypokalemia: Secondary | ICD-10-CM | POA: Diagnosis present

## 2021-12-15 DIAGNOSIS — I2510 Atherosclerotic heart disease of native coronary artery without angina pectoris: Secondary | ICD-10-CM | POA: Diagnosis present

## 2021-12-15 DIAGNOSIS — D638 Anemia in other chronic diseases classified elsewhere: Secondary | ICD-10-CM | POA: Diagnosis present

## 2021-12-15 DIAGNOSIS — Z23 Encounter for immunization: Secondary | ICD-10-CM

## 2021-12-15 DIAGNOSIS — F1010 Alcohol abuse, uncomplicated: Secondary | ICD-10-CM

## 2021-12-15 DIAGNOSIS — R6520 Severe sepsis without septic shock: Secondary | ICD-10-CM | POA: Diagnosis not present

## 2021-12-15 DIAGNOSIS — I110 Hypertensive heart disease with heart failure: Secondary | ICD-10-CM | POA: Diagnosis present

## 2021-12-15 DIAGNOSIS — R0603 Acute respiratory distress: Secondary | ICD-10-CM

## 2021-12-15 DIAGNOSIS — K5100 Ulcerative (chronic) pancolitis without complications: Secondary | ICD-10-CM | POA: Diagnosis present

## 2021-12-15 DIAGNOSIS — R918 Other nonspecific abnormal finding of lung field: Secondary | ICD-10-CM

## 2021-12-15 DIAGNOSIS — K529 Noninfective gastroenteritis and colitis, unspecified: Secondary | ICD-10-CM | POA: Diagnosis not present

## 2021-12-15 DIAGNOSIS — I9589 Other hypotension: Secondary | ICD-10-CM | POA: Diagnosis present

## 2021-12-15 DIAGNOSIS — Z800 Family history of malignant neoplasm of digestive organs: Secondary | ICD-10-CM

## 2021-12-15 DIAGNOSIS — Z720 Tobacco use: Secondary | ICD-10-CM

## 2021-12-15 DIAGNOSIS — R601 Generalized edema: Secondary | ICD-10-CM | POA: Diagnosis not present

## 2021-12-15 DIAGNOSIS — R64 Cachexia: Secondary | ICD-10-CM | POA: Diagnosis present

## 2021-12-15 DIAGNOSIS — Z681 Body mass index (BMI) 19 or less, adult: Secondary | ICD-10-CM | POA: Diagnosis not present

## 2021-12-15 DIAGNOSIS — R627 Adult failure to thrive: Secondary | ICD-10-CM

## 2021-12-15 DIAGNOSIS — J432 Centrilobular emphysema: Secondary | ICD-10-CM | POA: Diagnosis present

## 2021-12-15 DIAGNOSIS — A419 Sepsis, unspecified organism: Secondary | ICD-10-CM | POA: Diagnosis present

## 2021-12-15 DIAGNOSIS — Z28310 Unvaccinated for covid-19: Secondary | ICD-10-CM

## 2021-12-15 DIAGNOSIS — Z808 Family history of malignant neoplasm of other organs or systems: Secondary | ICD-10-CM

## 2021-12-15 DIAGNOSIS — R159 Full incontinence of feces: Secondary | ICD-10-CM | POA: Diagnosis present

## 2021-12-15 DIAGNOSIS — J810 Acute pulmonary edema: Secondary | ICD-10-CM | POA: Diagnosis not present

## 2021-12-15 DIAGNOSIS — I82452 Acute embolism and thrombosis of left peroneal vein: Secondary | ICD-10-CM | POA: Diagnosis present

## 2021-12-15 DIAGNOSIS — R1310 Dysphagia, unspecified: Secondary | ICD-10-CM | POA: Diagnosis present

## 2021-12-15 DIAGNOSIS — F17210 Nicotine dependence, cigarettes, uncomplicated: Secondary | ICD-10-CM | POA: Diagnosis present

## 2021-12-15 DIAGNOSIS — R609 Edema, unspecified: Secondary | ICD-10-CM

## 2021-12-15 DIAGNOSIS — E46 Unspecified protein-calorie malnutrition: Secondary | ICD-10-CM | POA: Diagnosis present

## 2021-12-15 LAB — CBC WITH DIFFERENTIAL/PLATELET
Band Neutrophils: 6 %
Basophils Absolute: 0 10*3/uL (ref 0.0–0.1)
Basophils Relative: 0 %
Eosinophils Absolute: 0.2 10*3/uL (ref 0.0–0.5)
Eosinophils Relative: 1 %
HCT: 29.9 % — ABNORMAL LOW (ref 39.0–52.0)
Hemoglobin: 10.4 g/dL — ABNORMAL LOW (ref 13.0–17.0)
Lymphocytes Relative: 0 %
Lymphs Abs: 0 10*3/uL — ABNORMAL LOW (ref 0.7–4.0)
MCH: 36.1 pg — ABNORMAL HIGH (ref 26.0–34.0)
MCHC: 34.8 g/dL (ref 30.0–36.0)
MCV: 103.8 fL — ABNORMAL HIGH (ref 80.0–100.0)
Metamyelocytes Relative: 22 %
Monocytes Absolute: 0 10*3/uL — ABNORMAL LOW (ref 0.1–1.0)
Monocytes Relative: 0 %
Myelocytes: 4 %
Neutro Abs: 11.8 10*3/uL — ABNORMAL HIGH (ref 1.7–7.7)
Neutrophils Relative %: 67 %
Platelets: 135 10*3/uL — ABNORMAL LOW (ref 150–400)
RBC: 2.88 MIL/uL — ABNORMAL LOW (ref 4.22–5.81)
RDW: 15.7 % — ABNORMAL HIGH (ref 11.5–15.5)
WBC: 16.1 10*3/uL — ABNORMAL HIGH (ref 4.0–10.5)
nRBC: 0 % (ref 0.0–0.2)

## 2021-12-15 LAB — LACTIC ACID, PLASMA
Lactic Acid, Venous: 2.2 mmol/L (ref 0.5–1.9)
Lactic Acid, Venous: 2.4 mmol/L (ref 0.5–1.9)
Lactic Acid, Venous: 1.8 mmol/L (ref 0.5–1.9)

## 2021-12-15 LAB — COMPREHENSIVE METABOLIC PANEL
ALT: 29 U/L (ref 0–44)
AST: 36 U/L (ref 15–41)
Albumin: 1.5 g/dL — ABNORMAL LOW (ref 3.5–5.0)
Alkaline Phosphatase: 98 U/L (ref 38–126)
Anion gap: 9 (ref 5–15)
BUN: 15 mg/dL (ref 8–23)
CO2: 24 mmol/L (ref 22–32)
Calcium: 7.4 mg/dL — ABNORMAL LOW (ref 8.9–10.3)
Chloride: 98 mmol/L (ref 98–111)
Creatinine, Ser: 1.07 mg/dL (ref 0.61–1.24)
GFR, Estimated: >60 mL/min (ref >60)
Glucose, Bld: 110 mg/dL — ABNORMAL HIGH (ref 70–99)
Potassium: 3.6 mmol/L (ref 3.5–5.1)
Sodium: 131 mmol/L — ABNORMAL LOW (ref 135–145)
Total Bilirubin: 1.5 mg/dL — ABNORMAL HIGH (ref 0.3–1.2)
Total Protein: 5 g/dL — ABNORMAL LOW (ref 6.5–8.1)

## 2021-12-15 LAB — LIPASE, BLOOD: Lipase: 25 U/L (ref 11–51)

## 2021-12-15 LAB — RESP PANEL BY RT-PCR (FLU A&B, COVID) ARPGX2
Influenza A by PCR: NEGATIVE
Influenza B by PCR: NEGATIVE
SARS Coronavirus 2 by RT PCR: NEGATIVE

## 2021-12-15 LAB — D-DIMER, QUANTITATIVE: D-Dimer, Quant: 1.45 ug/mL-FEU — ABNORMAL HIGH (ref 0.00–0.50)

## 2021-12-15 LAB — TROPONIN I (HIGH SENSITIVITY)
Troponin I (High Sensitivity): 7 ng/L (ref <18)
Troponin I (High Sensitivity): 6 ng/L (ref <18)

## 2021-12-15 LAB — TSH: TSH: 2.992 u[IU]/mL (ref 0.350–4.500)

## 2021-12-15 LAB — CK: Total CK: 293 U/L (ref 49–397)

## 2021-12-15 LAB — BRAIN NATRIURETIC PEPTIDE: B Natriuretic Peptide: 271 pg/mL — ABNORMAL HIGH (ref 0.0–100.0)

## 2021-12-15 MED ORDER — THIAMINE HCL 100 MG PO TABS
100.0000 mg | ORAL_TABLET | Freq: Every day | ORAL | Status: DC
  Administered 2021-12-16 – 2021-12-20 (×4): 100 mg via ORAL
  Filled 2021-12-15 (×4): qty 1

## 2021-12-15 MED ORDER — THIAMINE HCL 100 MG/ML IJ SOLN
100.0000 mg | Freq: Every day | INTRAMUSCULAR | Status: DC
  Administered 2021-12-19: 100 mg via INTRAVENOUS
  Filled 2021-12-15: qty 2

## 2021-12-15 MED ORDER — METRONIDAZOLE 500 MG/100ML IV SOLN
500.0000 mg | Freq: Two times a day (BID) | INTRAVENOUS | Status: DC
  Administered 2021-12-15 – 2021-12-18 (×6): 500 mg via INTRAVENOUS
  Filled 2021-12-15 (×6): qty 100

## 2021-12-15 MED ORDER — TRAZODONE HCL 50 MG PO TABS: 50.0000 mg | ORAL_TABLET | Freq: Every evening | ORAL | Status: DC | PRN

## 2021-12-15 MED ORDER — FOLIC ACID 5 MG/ML IJ SOLN
1.0000 mg | Freq: Once | INTRAMUSCULAR | Status: DC
  Filled 2021-12-15: qty 0.2

## 2021-12-15 MED ORDER — ALBUTEROL SULFATE HFA 108 (90 BASE) MCG/ACT IN AERS: 2.0000 | INHALATION_SPRAY | Freq: Four times a day (QID) | RESPIRATORY_TRACT | Status: DC | PRN

## 2021-12-15 MED ORDER — IOHEXOL 300 MG/ML  SOLN: 100.0000 mL | Freq: Once | INTRAMUSCULAR | Status: DC | PRN

## 2021-12-15 MED ORDER — SODIUM CHLORIDE 0.9 % IV BOLUS
1000.0000 mL | Freq: Once | INTRAVENOUS | Status: AC
  Administered 2021-12-15: 1000 mL via INTRAVENOUS

## 2021-12-15 MED ORDER — LORAZEPAM 1 MG PO TABS
1.0000 mg | ORAL_TABLET | ORAL | Status: AC | PRN
  Administered 2021-12-17: 1 mg via ORAL
  Filled 2021-12-15: qty 1

## 2021-12-15 MED ORDER — NOREPINEPHRINE 4 MG/250ML-% IV SOLN: 0.0000 ug/min | INTRAVENOUS | Status: DC

## 2021-12-15 MED ORDER — ACETAMINOPHEN 650 MG RE SUPP: 650.0000 mg | Freq: Four times a day (QID) | RECTAL | Status: DC | PRN

## 2021-12-15 MED ORDER — ADULT MULTIVITAMIN W/MINERALS CH: 1.0000 | ORAL_TABLET | Freq: Every day | ORAL | Status: DC

## 2021-12-15 MED ORDER — ALBUTEROL SULFATE (2.5 MG/3ML) 0.083% IN NEBU: 2.5000 mg | INHALATION_SOLUTION | Freq: Four times a day (QID) | RESPIRATORY_TRACT | Status: DC | PRN

## 2021-12-15 MED ORDER — ONDANSETRON HCL 4 MG PO TABS: 4.0000 mg | ORAL_TABLET | Freq: Four times a day (QID) | ORAL | Status: DC | PRN

## 2021-12-15 MED ORDER — FERROUS SULFATE 325 (65 FE) MG PO TABS
325.0000 mg | ORAL_TABLET | Freq: Every day | ORAL | Status: DC
  Administered 2021-12-16 – 2021-12-20 (×5): 325 mg via ORAL
  Filled 2021-12-15 (×5): qty 1

## 2021-12-15 MED ORDER — IOHEXOL 350 MG/ML SOLN
100.0000 mL | Freq: Once | INTRAVENOUS | Status: AC | PRN
  Administered 2021-12-15: 80 mL via INTRAVENOUS

## 2021-12-15 MED ORDER — OXYCODONE HCL 5 MG PO TABS: 5.0000 mg | ORAL_TABLET | ORAL | Status: DC | PRN

## 2021-12-15 MED ORDER — HYDROCORTISONE SOD SUC (PF) 100 MG IJ SOLR
100.0000 mg | Freq: Two times a day (BID) | INTRAMUSCULAR | Status: DC
  Administered 2021-12-15: 100 mg via INTRAVENOUS
  Filled 2021-12-15: qty 2

## 2021-12-15 MED ORDER — ONDANSETRON HCL 4 MG/2ML IJ SOLN
4.0000 mg | Freq: Once | INTRAMUSCULAR | Status: DC
  Filled 2021-12-15: qty 2

## 2021-12-15 MED ORDER — FOLIC ACID 1 MG PO TABS
1.0000 mg | ORAL_TABLET | Freq: Every day | ORAL | Status: DC
  Administered 2021-12-16 – 2021-12-20 (×5): 1 mg via ORAL
  Filled 2021-12-15 (×5): qty 1

## 2021-12-15 MED ORDER — HEPARIN SODIUM (PORCINE) 5000 UNIT/ML IJ SOLN
5000.0000 [IU] | Freq: Three times a day (TID) | INTRAMUSCULAR | Status: DC
  Administered 2021-12-15 – 2021-12-16 (×3): 5000 [IU] via SUBCUTANEOUS
  Filled 2021-12-15 (×3): qty 1

## 2021-12-15 MED ORDER — SODIUM CHLORIDE 0.9 % IV SOLN: 250.0000 mL | INTRAVENOUS | Status: DC

## 2021-12-15 MED ORDER — LORAZEPAM 2 MG/ML IJ SOLN: 1.0000 mg | INTRAMUSCULAR | Status: AC | PRN

## 2021-12-15 MED ORDER — ALBUMIN HUMAN 25 % IV SOLN
12.5000 g | Freq: Once | INTRAVENOUS | Status: AC
  Administered 2021-12-15: 12.5 g via INTRAVENOUS
  Filled 2021-12-15 (×2): qty 50

## 2021-12-15 MED ORDER — MIRTAZAPINE 15 MG PO TABS
15.0000 mg | ORAL_TABLET | Freq: Every day | ORAL | Status: DC
Start: 2021-12-15 — End: 2021-12-20
  Administered 2021-12-15 – 2021-12-19 (×4): 15 mg via ORAL
  Filled 2021-12-15 (×4): qty 1

## 2021-12-15 MED ORDER — ALBUMIN HUMAN 25 % IV SOLN
12.5000 g | INTRAVENOUS | Status: AC
  Administered 2021-12-15 – 2021-12-16 (×3): 12.5 g via INTRAVENOUS
  Filled 2021-12-15 (×6): qty 50

## 2021-12-15 MED ORDER — SODIUM CHLORIDE 0.9 % IV SOLN
2.0000 g | Freq: Two times a day (BID) | INTRAVENOUS | Status: DC
  Administered 2021-12-16: 2 g via INTRAVENOUS
  Filled 2021-12-15 (×2): qty 2

## 2021-12-15 MED ORDER — NOREPINEPHRINE 4 MG/250ML-% IV SOLN
INTRAVENOUS | Status: AC
  Administered 2021-12-15: 10 ug/min via INTRAVENOUS
  Filled 2021-12-15: qty 250

## 2021-12-15 MED ORDER — QUETIAPINE FUMARATE 25 MG PO TABS
25.0000 mg | ORAL_TABLET | Freq: Every day | ORAL | Status: DC
  Administered 2021-12-15 – 2021-12-19 (×4): 25 mg via ORAL
  Filled 2021-12-15 (×4): qty 1

## 2021-12-15 MED ORDER — UMECLIDINIUM BROMIDE 62.5 MCG/ACT IN AEPB
1.0000 | INHALATION_SPRAY | Freq: Every day | RESPIRATORY_TRACT | Status: DC
  Administered 2021-12-18 – 2021-12-20 (×3): 1 via RESPIRATORY_TRACT
  Filled 2021-12-15: qty 7

## 2021-12-15 MED ORDER — SODIUM CHLORIDE 0.9 % IV SOLN
2.0000 g | INTRAVENOUS | Status: AC
  Administered 2021-12-15: 2 g via INTRAVENOUS
  Filled 2021-12-15: qty 2

## 2021-12-15 MED ORDER — VANCOMYCIN HCL IN DEXTROSE 1-5 GM/200ML-% IV SOLN
1000.0000 mg | INTRAVENOUS | Status: DC
  Administered 2021-12-16: 1000 mg via INTRAVENOUS
  Filled 2021-12-15: qty 200

## 2021-12-15 MED ORDER — ADULT MULTIVITAMIN W/MINERALS CH
1.0000 | ORAL_TABLET | Freq: Every day | ORAL | Status: DC
  Administered 2021-12-15 – 2021-12-20 (×6): 1 via ORAL
  Filled 2021-12-15 (×6): qty 1

## 2021-12-15 MED ORDER — NICOTINE 21 MG/24HR TD PT24
21.0000 mg | MEDICATED_PATCH | Freq: Every day | TRANSDERMAL | Status: DC
  Administered 2021-12-16 – 2021-12-20 (×5): 21 mg via TRANSDERMAL
  Filled 2021-12-15 (×5): qty 1

## 2021-12-15 MED ORDER — ENSURE ENLIVE PO LIQD
237.0000 mL | Freq: Two times a day (BID) | ORAL | Status: DC
  Administered 2021-12-17 – 2021-12-19 (×4): 237 mL via ORAL
  Filled 2021-12-15 (×6): qty 237

## 2021-12-15 MED ORDER — VANCOMYCIN HCL IN DEXTROSE 1-5 GM/200ML-% IV SOLN
1000.0000 mg | INTRAVENOUS | Status: AC
  Administered 2021-12-15: 1000 mg via INTRAVENOUS
  Filled 2021-12-15: qty 200

## 2021-12-15 MED ORDER — THIAMINE HCL 100 MG/ML IJ SOLN
100.0000 mg | Freq: Every day | INTRAMUSCULAR | Status: DC
  Administered 2021-12-15 – 2021-12-16 (×2): 100 mg via INTRAVENOUS
  Filled 2021-12-15: qty 2

## 2021-12-15 MED ORDER — SODIUM CHLORIDE 0.9 % IV SOLN: INTRAVENOUS | Status: DC

## 2021-12-15 MED ORDER — ACETAMINOPHEN 325 MG PO TABS: 650.0000 mg | ORAL_TABLET | Freq: Four times a day (QID) | ORAL | Status: DC | PRN

## 2021-12-15 MED ORDER — NOREPINEPHRINE 4 MG/250ML-% IV SOLN
2.0000 ug/min | INTRAVENOUS | Status: DC
  Administered 2021-12-16: 6 ug/min via INTRAVENOUS
  Administered 2021-12-16: 5 ug/min via INTRAVENOUS
  Filled 2021-12-15 (×3): qty 250

## 2021-12-15 MED ORDER — ONDANSETRON HCL 4 MG/2ML IJ SOLN: 4.0000 mg | Freq: Four times a day (QID) | INTRAMUSCULAR | Status: DC | PRN

## 2021-12-15 NOTE — H&P (Signed)
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 pulmonary mass. No pleural effusion. No pneumothorax. Musculoskeletal: No chest wall abnormality. No suspicious lytic or blastic osseous lesions. No acute displaced fracture. Multilevel degenerative changes of the spine. Review of the MIP images confirms the above findings. CT ABDOMEN and PELVIS FINDINGS Hepatobiliary: No focal liver abnormality. No gallstones, gallbladder wall thickening, or pericholecystic fluid. No biliary dilatation. Pancreas: No focal lesion. Normal pancreatic contour. No  surrounding inflammatory changes. No main pancreatic ductal dilatation. Spleen: Normal in size without focal abnormality. Adrenals/Urinary Tract: No adrenal nodule bilaterally. Bilateral kidneys enhance symmetrically. No hydronephrosis. No hydroureter. The urinary bladder is unremarkable. Stomach/Bowel: Stomach is within normal limits. No evidence of small bowel wall thickening or dilatation. Diffuse bowel wall thickening of the colon with associated mucosal hyperemia and trace pericolonic fat stranding. Fluid density noted within the lumen of the large bowel. Appendix appears normal. Vascular/Lymphatic: No abdominal aorta or iliac aneurysm. Severe atherosclerotic plaque of the aorta and its branches. No abdominal, pelvic, or inguinal lymphadenopathy. Reproductive: Prostate is unremarkable. Other: Trace volume free fluid within the pelvis. No intraperitoneal free gas. No organized fluid collection. Musculoskeletal: No abdominal wall hernia or abnormality. No suspicious lytic or blastic osseous lesions. No acute displaced fracture. Multilevel degenerative changes of the spine. Review of the MIP images confirms the above findings. IMPRESSION: 1. No pulmonary embolus. 2. Mild pulmonary edema. 3. Trace right pleural effusion. 4. Pancolitis. 5. Aortic Atherosclerosis (ICD10-I70.0) and Emphysema (ICD10-J43.9). 6. Few scattered pulmonary micronodules. No follow-up needed if patient is low-risk (and has no known or suspected primary neoplasm). Non-contrast chest CT can be considered in 12 months if patient is high-risk. This recommendation follows the consensus statement: Guidelines for Management of Incidental Pulmonary Nodules Detected on CT Images: From the Fleischner Society 2017; Radiology 2017; 284:228-243. Electronically Signed   By: Tish Frederickson M.D.   On: 12/15/2021 18:52       Assessment & Plan:    Principal Problem:   Sepsis (HCC) Active Problems:   Tobacco abuse   Protein-calorie malnutrition,  severe   Protein calorie malnutrition (HCC)   Alcohol abuse   Colitis   Sepsis secondary to colitis Sepsis secondary to colitis SIRS criteria respiratory rate 23, blood pressure 60/40, white blood cell count 16.1, lactic acid 2.4 Life threatening organ dysfunction 2/2 infection is evidenced by: Lactic acid 2.4, hypoxic down to 84% Antibiotics started vancomycin and cefepime started in the ED, Flagyl added at admission Fluids given prior to admission- gentle hydration given pleural effusion and mild pulmonary edema on imaging, but with blood pressure being so low patient does require some fluids Pressors discussed extensively with ED provider regarding possible central line.  He has been decided not to do central line at this time as patient is clinically stable and manual blood  pressure is stable versus machine blood pressure.  Admitting to the ICU for close monitoring.  May need peripheral pressors for short-term support.  Attempting albumin and Solu-Cortef for blood pressure as well Sepsis order set utilized Monitor this patient on stepdown Colitis Pancolitis seen on CT with patient complaining of diarrhea Flagyl to antibiotic regimen Continue treatment as above for sepsis Given patient's malnutrition, dehydration, not holding diuretics at this time.  If diarrhea worsens or patient becomes nauseous may need to do bowel rest Alcohol abuse Drinks 2-4 beers daily CIWA protocol Thiamine, folate, multivitamin added Continue to monitor Tobacco use disorder Counseled on importance of cessation 21 mg nicotine patch ordered, may need additional patch due to 1-1/2 packs/day history Protein calorie malnutrition Albumin 12.5x4 doses Ensure Enlive with meals Trend in a.m. Pulmonary edema Seen on CT No respiratory symptoms, clinically dehydrated Check echo in the a.m. Last echo showed ejection fraction of 60 to [id number] and diastolic dysfunction in [id number] Low threshold to discontinue fluids if  blood pressure can tolerate Elevated D-dimer D-dimer 1.45 with hypotension CTA is negative for pulmonary embolus Left lower extremity edema Ultrasound DVT study in the a.m. Hypertension Holding diltiazem in the setting of hypotension   DVT Prophylaxis-   Heparin - SCDs   AM Labs Ordered, also please review Full Orders  Family Communication: No family at bedside  Code Status: Full  Admission status:  inpatient :The appropriate admission status for this patient is INPATIENT. Inpatient status is judged to be reasonable and necessary in order to provide the required intensity of service to ensure the patient's safety. The patient's presenting symptoms, physical exam findings, and initial radiographic and laboratory data in the context of their chronic comorbidities is felt to place them at high risk for further clinical deterioration. Furthermore, it is not anticipated that the patient will be medically stable for discharge from the hospital within 2 midnights of admission. The following factors support the admission status of inpatient.     The patient's presenting symptoms include diarrhea. The worrisome physical exam findings include dehydration, cachexia. The initial radiographic and laboratory data are worrisome because of colitis. The chronic co-morbidities include hypertension, alcohol abuse, COPD, tobacco use disorder.       * I certify that at the point of admission it is my clinical judgment that the patient will require inpatient hospital care spanning beyond 2 midnights from the point of admission due to high intensity of service, high risk for further deterioration and high frequency of surveillance required.*  Disposition: Anticipated Discharge date 48-72 hours discharge to home with home health  Time spent in minutes : Archie DO

## 2021-12-15 NOTE — ED Triage Notes (Signed)
Patient has multiple problems, has not been able to eat and has diarrhea for months, had a visit by home health today, referred to the hospital

## 2021-12-15 NOTE — ED Provider Notes (Addendum)
[facility] Provider Note   CSN: [id number] Arrival date & time: 12/15/21  1346     History  Chief Complaint  Patient presents with   Hypotension    Spencer Hickman is a 66 y.o. male.  This is a 66 y.o. male with significant medical history as below, including daily alcohol and tobacco use.  Who presents to the ED with complaint of low blood pressure.  Patient accompanied by caretaker.  Patient with poor oral intake over the past >3 months.  Patient drinks alcohol daily, 2-3 regular size beers daily.  Also smokes cigarettes daily.  Limited oral intake other than alcohol.  Patient reports he is lost approximately 50 pounds over the past 1 to 1.5 years.  He has been feeling progressive fatigue, not acutely worsened in the past 24 hours.  No chest pain.  Does have intermittent dyspnea on exertion that he attributes to his COPD.  No change to his [place] home oxygen use.  No nausea or vomiting.  No fevers or chills.  No abdominal pain.  No headaches, vision changes, falls, numbness or tingling.  The history is provided by the patient and a caregiver. No language interpreter was used.     Patient Active Problem List   Diagnosis Date Noted   Sepsis ([place]) 12/15/2021   Protein calorie malnutrition ([place]) 12/15/2021   Alcohol abuse 12/15/2021   Colitis 12/15/2021   Protein-calorie malnutrition, severe 02/15/2020   Community acquired pneumonia of left lung    SOB (shortness of breath)    Symptomatic anemia 02/06/2019   Absolute anemia    Syncopal episodes 02/05/2019   Microcytic anemia 02/05/2019   COPD with acute exacerbation ([place]) 02/05/2019   Alcohol abuse with intoxication ([place]) 02/05/2019   Tobacco abuse 02/05/2019   Hypoalbuminemia 02/05/2019   Chest pain 02/05/2019     Home Medications Prior to Admission medications   Medication Sig Start Date End Date Taking? Authorizing Provider  albuterol (VENTOLIN HFA) 108 (90 Base) MCG/ACT inhaler Inhale 2 puffs  into the lungs every 6 (six) hours as needed for shortness of breath. 07/07/21  Yes [provider]  dextromethorphan (DELSYM) 30 MG/5ML liquid Take 5 mLs (30 mg total) by mouth 2 (two) times daily as needed for cough. 02/18/20  Yes Johnson, Clanford L, MD  diltiazem (CARDIZEM CD) 120 MG 24 hr capsule Take 120 mg by mouth daily. 09/14/21  Yes [provider]  ferrous sulfate 325 (65 FE) MG tablet Take 1 tablet (325 mg total) by mouth daily with breakfast. 02/19/20  Yes Johnson, Clanford L, MD  folic acid (FOLVITE) 1 MG tablet Take 1 tablet (1 mg total) by mouth daily. 02/19/20  Yes Johnson, Clanford L, MD  metoprolol succinate (TOPROL-XL) 50 MG 24 hr tablet Take 50 mg by mouth daily. 09/14/21  Yes [provider]  mirtazapine (REMERON) 15 MG tablet Take 15 mg by mouth at bedtime.   Yes [provider]  Multiple Vitamin (MULTIVITAMIN WITH MINERALS) TABS tablet Take 1 tablet by mouth daily. 02/19/20  Yes Johnson, Clanford L, MD  omeprazole (PRILOSEC) 20 MG capsule Take 20 mg by mouth daily. 12/08/21  Yes [provider]  phenol (CHLORASEPTIC) 1.4 % LIQD Use as directed 1 spray in the mouth or throat as needed for throat irritation / pain.   Yes [provider]  QUEtiapine (SEROQUEL) 25 MG tablet Take 25 mg by mouth at bedtime. 10/28/21  Yes [provider]  SPIRIVA HANDIHALER 18 MCG inhalation capsule  Place 18 mcg into inhaler and inhale daily. 08/10/21  Yes [provider]      Allergies    Patient has no known allergies.    Review of Systems   Review of Systems  Constitutional:  Positive for fatigue. Negative for chills and fever.  HENT:  Negative for facial swelling and trouble swallowing.   Eyes:  Negative for photophobia and visual disturbance.  Respiratory:  Negative for cough and shortness of breath.   Cardiovascular:  Negative for chest pain and palpitations.  Gastrointestinal:  Negative for abdominal pain, nausea and vomiting.   Endocrine: Negative for polydipsia and polyuria.  Genitourinary:  Negative for difficulty urinating and hematuria.  Musculoskeletal:  Negative for gait problem and joint swelling.  Skin:  Negative for pallor and rash.  Neurological:  Negative for syncope and headaches.  Psychiatric/Behavioral:  Negative for agitation and confusion.    Physical Exam Updated Vital Signs BP (!) 72/51    Pulse 74    Temp 97.6 F (36.4 C) (Oral)    Resp 16    Ht 5\' 6"  (1.676 m)    Wt 56 kg    SpO2 100%    BMI 19.93 kg/m  Physical Exam Vitals and nursing note reviewed.  Constitutional:      General: He is not in acute distress.    Appearance: He is well-developed.     Comments: Frail, cachectic appearing  HENT:     Head: Normocephalic and atraumatic.     Right Ear: External ear normal.     Left Ear: External ear normal.     Mouth/Throat:     Mouth: Mucous membranes are moist.  Eyes:     General: No scleral icterus. Cardiovascular:     Rate and Rhythm: Normal rate and regular rhythm.     Pulses: Normal pulses.     Heart sounds: Normal heart sounds.  Pulmonary:     Effort: Pulmonary effort is normal. No respiratory distress.     Breath sounds: Normal breath sounds.  Abdominal:     General: Abdomen is flat.     Palpations: Abdomen is soft.     Tenderness: There is no abdominal tenderness.  Musculoskeletal:        General: Normal range of motion.     Cervical back: Normal range of motion.     Right lower leg: Edema present.     Left lower leg: Edema present.     Comments: Pedal edema left foot greater than right foot  Skin:    General: Skin is warm and dry.     Capillary Refill: Capillary refill takes less than 2 seconds.  Neurological:     Mental Status: He is alert and oriented to person, place, and time.  Psychiatric:        Mood and Affect: Mood normal.        Behavior: Behavior normal.    ED Results / Procedures / Treatments   Labs (all labs ordered are listed, but only abnormal  results are displayed) Labs Reviewed  CBC WITH DIFFERENTIAL/PLATELET - Abnormal; Notable for the following components:      Result Value   WBC 16.1 (*)    RBC 2.88 (*)    Hemoglobin 10.4 (*)    HCT 29.9 (*)    MCV 103.8 (*)    MCH 36.1 (*)    RDW 15.7 (*)    Platelets 135 (*)    Neutro Abs 11.8 (*)    Lymphs Abs 0.0 (*)  Monocytes Absolute 0.0 (*)    All other components within normal limits  COMPREHENSIVE METABOLIC PANEL - Abnormal; Notable for the following components:   Sodium 131 (*)    Glucose, Bld 110 (*)    Calcium 7.4 (*)    Total Protein 5.0 (*)    Albumin 1.5 (*)    Total Bilirubin 1.5 (*)    All other components within normal limits  LACTIC ACID, PLASMA - Abnormal; Notable for the following components:   Lactic Acid, Venous 2.2 (*)    All other components within normal limits  LACTIC ACID, PLASMA - Abnormal; Notable for the following components:   Lactic Acid, Venous 2.4 (*)    All other components within normal limits  BRAIN NATRIURETIC PEPTIDE - Abnormal; Notable for the following components:   B Natriuretic Peptide 271.0 (*)    All other components within normal limits  D-DIMER, QUANTITATIVE - Abnormal; Notable for the following components:   D-Dimer, Quant 1.45 (*)    All other components within normal limits  RESP PANEL BY RT-PCR (FLU A&B, COVID) ARPGX2  CULTURE, BLOOD (ROUTINE X 2)  CULTURE, BLOOD (ROUTINE X 2)  C DIFFICILE QUICK SCREEN W PCR REFLEX    URINE CULTURE  EXPECTORATED SPUTUM ASSESSMENT W GRAM STAIN, RFLX TO RESP C  LIPASE, BLOOD  CK  TSH  LACTIC ACID, PLASMA  LACTIC ACID, PLASMA  URINALYSIS, ROUTINE W REFLEX MICROSCOPIC  HIV ANTIBODY (ROUTINE TESTING W REFLEX)  PROTIME-INR  CORTISOL-AM, BLOOD  PROCALCITONIN  COMPREHENSIVE METABOLIC PANEL  MAGNESIUM  CBC WITH DIFFERENTIAL/PLATELET  TROPONIN I (HIGH SENSITIVITY)  TROPONIN I (HIGH SENSITIVITY)    EKG EKG Interpretation  Date/Time:  Tuesday December 15 2021 17:29:33  EST Ventricular Rate:  67 PR Interval:  142 QRS Duration: 86 QT Interval:  433 QTC Calculation: 458 R Axis:   -70 Text Interpretation: Sinus rhythm LAD, consider LAFB or inferior infarct Low voltage, extremity and precordial leads Confirmed by Wynona Dove ([id number]) on 12/15/2021 6:00:00 PM  Radiology CT Angio Chest PE W and/or Wo Contrast  Result Date: 12/15/2021 CLINICAL DATA:  Pulmonary embolism (PE) suspected, positive D-dimer; Abdominal pain, acute, nonlocalized EXAM: CT ANGIOGRAPHY CHEST CT ABDOMEN AND PELVIS WITH CONTRAST TECHNIQUE: Multidetector CT imaging of the chest was performed using the standard protocol during bolus administration of intravenous contrast. Multiplanar CT image reconstructions and MIPs were obtained to evaluate the vascular anatomy. Multidetector CT imaging of the abdomen and pelvis was performed using the standard protocol during bolus administration of intravenous contrast. CONTRAST:  62mL OMNIPAQUE IOHEXOL 350 MG/ML SOLN COMPARISON:  None. FINDINGS: CTA CHEST FINDINGS Cardiovascular: Satisfactory opacification of the pulmonary arteries to the segmental level. No evidence of pulmonary embolism. Normal heart size. No significant pericardial effusion. The thoracic aorta is normal in caliber. Mild-to-moderate atherosclerotic plaque of the thoracic aorta. Aortic valve leaflet calcifications. Three vessel coronary artery calcifications. Mediastinum/Nodes: No enlarged mediastinal, hilar, or axillary lymph nodes. Thyroid gland, trachea, and esophagus demonstrate no significant findings. Trace debris within the trachea. Lungs/Pleura: Mild interlobular septal wall thickening most prominent within bilateral upper lobes. Diffuse at least moderate centrilobular emphysematous changes. No focal consolidation. Few scattered pulmonary micronodules. No pulmonary mass. No pleural effusion. No pneumothorax. Musculoskeletal: No chest wall abnormality. No suspicious lytic or blastic osseous lesions.  No acute displaced fracture. Multilevel degenerative changes of the spine. Review of the MIP images confirms the above findings. CT ABDOMEN and PELVIS FINDINGS Hepatobiliary: No focal liver abnormality. No gallstones, gallbladder wall thickening, or pericholecystic fluid. No biliary  dilatation. Pancreas: No focal lesion. Normal pancreatic contour. No surrounding inflammatory changes. No main pancreatic ductal dilatation. Spleen: Normal in size without focal abnormality. Adrenals/Urinary Tract: No adrenal nodule bilaterally. Bilateral kidneys enhance symmetrically. No hydronephrosis. No hydroureter. The urinary bladder is unremarkable. Stomach/Bowel: Stomach is within normal limits. No evidence of small bowel wall thickening or dilatation. Diffuse bowel wall thickening of the colon with associated mucosal hyperemia and trace pericolonic fat stranding. Fluid density noted within the lumen of the large bowel. Appendix appears normal. Vascular/Lymphatic: No abdominal aorta or iliac aneurysm. Severe atherosclerotic plaque of the aorta and its branches. No abdominal, pelvic, or inguinal lymphadenopathy. Reproductive: Prostate is unremarkable. Other: Trace volume free fluid within the pelvis. No intraperitoneal free gas. No organized fluid collection. Musculoskeletal: No abdominal wall hernia or abnormality. No suspicious lytic or blastic osseous lesions. No acute displaced fracture. Multilevel degenerative changes of the spine. Review of the MIP images confirms the above findings. IMPRESSION: 1. No pulmonary embolus. 2. Mild pulmonary edema. 3. Trace right pleural effusion. 4. Pancolitis. 5. Aortic Atherosclerosis (ICD10-I70.0) and Emphysema (ICD10-J43.9). 6. Few scattered pulmonary micronodules. No follow-up needed if patient is low-risk (and has no known or suspected primary neoplasm). Non-contrast chest CT can be considered in 12 months if patient is high-risk. This recommendation follows the consensus statement:  Guidelines for Management of Incidental Pulmonary Nodules Detected on CT Images: From the Fleischner Society 2017; Radiology 2017; 284:228-243. Electronically Signed   By: Iven Finn M.D.   On: 12/15/2021 18:52   CT ABDOMEN PELVIS W CONTRAST  Result Date: 12/15/2021 CLINICAL DATA:  Pulmonary embolism (PE) suspected, positive D-dimer; Abdominal pain, acute, nonlocalized EXAM: CT ANGIOGRAPHY CHEST CT ABDOMEN AND PELVIS WITH CONTRAST TECHNIQUE: Multidetector CT imaging of the chest was performed using the standard protocol during bolus administration of intravenous contrast. Multiplanar CT image reconstructions and MIPs were obtained to evaluate the vascular anatomy. Multidetector CT imaging of the abdomen and pelvis was performed using the standard protocol during bolus administration of intravenous contrast. CONTRAST:  14mL OMNIPAQUE IOHEXOL 350 MG/ML SOLN COMPARISON:  None. FINDINGS: CTA CHEST FINDINGS Cardiovascular: Satisfactory opacification of the pulmonary arteries to the segmental level. No evidence of pulmonary embolism. Normal heart size. No significant pericardial effusion. The thoracic aorta is normal in caliber. Mild-to-moderate atherosclerotic plaque of the thoracic aorta. Aortic valve leaflet calcifications. Three vessel coronary artery calcifications. Mediastinum/Nodes: No enlarged mediastinal, hilar, or axillary lymph nodes. Thyroid gland, trachea, and esophagus demonstrate no significant findings. Trace debris within the trachea. Lungs/Pleura: Mild interlobular septal wall thickening most prominent within bilateral upper lobes. Diffuse at least moderate centrilobular emphysematous changes. No focal consolidation. Few scattered pulmonary micronodules. No pulmonary mass. No pleural effusion. No pneumothorax. Musculoskeletal: No chest wall abnormality. No suspicious lytic or blastic osseous lesions. No acute displaced fracture. Multilevel degenerative changes of the spine. Review of the MIP  images confirms the above findings. CT ABDOMEN and PELVIS FINDINGS Hepatobiliary: No focal liver abnormality. No gallstones, gallbladder wall thickening, or pericholecystic fluid. No biliary dilatation. Pancreas: No focal lesion. Normal pancreatic contour. No surrounding inflammatory changes. No main pancreatic ductal dilatation. Spleen: Normal in size without focal abnormality. Adrenals/Urinary Tract: No adrenal nodule bilaterally. Bilateral kidneys enhance symmetrically. No hydronephrosis. No hydroureter. The urinary bladder is unremarkable. Stomach/Bowel: Stomach is within normal limits. No evidence of small bowel wall thickening or dilatation. Diffuse bowel wall thickening of the colon with associated mucosal hyperemia and trace pericolonic fat stranding. Fluid density noted within the lumen of the large bowel. Appendix appears  normal. Vascular/Lymphatic: No abdominal aorta or iliac aneurysm. Severe atherosclerotic plaque of the aorta and its branches. No abdominal, pelvic, or inguinal lymphadenopathy. Reproductive: Prostate is unremarkable. Other: Trace volume free fluid within the pelvis. No intraperitoneal free gas. No organized fluid collection. Musculoskeletal: No abdominal wall hernia or abnormality. No suspicious lytic or blastic osseous lesions. No acute displaced fracture. Multilevel degenerative changes of the spine. Review of the MIP images confirms the above findings. IMPRESSION: 1. No pulmonary embolus. 2. Mild pulmonary edema. 3. Trace right pleural effusion. 4. Pancolitis. 5. Aortic Atherosclerosis (ICD10-I70.0) and Emphysema (ICD10-J43.9). 6. Few scattered pulmonary micronodules. No follow-up needed if patient is low-risk (and has no known or suspected primary neoplasm). Non-contrast chest CT can be considered in 12 months if patient is high-risk. This recommendation follows the consensus statement: Guidelines for Management of Incidental Pulmonary Nodules Detected on CT Images: From the  Fleischner Society 2017; Radiology 2017; 284:228-243. Electronically Signed   By: Iven Finn M.D.   On: 12/15/2021 18:52    Procedures .Critical Care Performed by: Jeanell Sparrow, DO Authorized by: Jeanell Sparrow, DO   Critical care provider statement:    Critical care time (minutes):  49   Critical care time was exclusive of:  Separately billable procedures and treating other patients   Critical care was necessary to treat or prevent imminent or life-threatening deterioration of the following conditions:  Dehydration   Critical care was time spent personally by me on the following activities:  Development of treatment plan with patient or surrogate, discussions with consultants, evaluation of patient's response to treatment, examination of patient, ordering and review of laboratory studies, ordering and review of radiographic studies, ordering and performing treatments and interventions, pulse oximetry, re-evaluation of patient's condition and review of old charts   Care discussed with: admitting provider   Comments:     Dehydration, hypotension    Medications Ordered in ED Medications  ondansetron (ZOFRAN) injection 4 mg (4 mg Intravenous Patient Refused/Not Given 12/15/21 1815)  thiamine (B-1) injection 100 mg (100 mg Intravenous Given [id number])  folic acid injection 1 mg (1 mg Intravenous Not Given 12/15/21 1856)  ceFEPIme (MAXIPIME) 2 g in sodium chloride 0.9 % 100 mL IVPB (has no administration in time range)  vancomycin (VANCOCIN) IVPB 1000 mg/200 mL premix (has no administration in time range)  LORazepam (ATIVAN) tablet 1-4 mg (has no administration in time range)    Or  LORazepam (ATIVAN) injection 1-4 mg (has no administration in time range)  thiamine tablet 100 mg (has no administration in time range)    Or  thiamine (B-1) injection 100 mg (has no administration in time range)  folic acid (FOLVITE) tablet 1 mg (has no administration in time range)  0.9 %  sodium chloride  infusion ( Intravenous New Bag/Given 12/15/21 2016)  metroNIDAZOLE (FLAGYL) IVPB 500 mg (0 mg Intravenous Stopped 12/15/21 2223)  mirtazapine (REMERON) tablet 15 mg (15 mg Oral Given 12/15/21 2106)  QUEtiapine (SEROQUEL) tablet 25 mg (25 mg Oral Given 12/15/21 2106)  ferrous sulfate tablet 325 mg (has no administration in time range)  multivitamin with minerals tablet 1 tablet (1 tablet Oral Given 12/15/21 2106)  umeclidinium bromide (INCRUSE ELLIPTA) 62.5 MCG/ACT 1 puff (has no administration in time range)  heparin injection 5,000 Units (5,000 Units Subcutaneous Given 12/15/21 2107)  acetaminophen (TYLENOL) tablet 650 mg (has no administration in time range)    Or  acetaminophen (TYLENOL) suppository 650 mg (has no administration in time range)  oxyCODONE (  Oxy IR/ROXICODONE) immediate release tablet 5 mg (has no administration in time range)  ondansetron (ZOFRAN) tablet 4 mg (has no administration in time range)    Or  ondansetron (ZOFRAN) injection 4 mg (has no administration in time range)  traZODone (DESYREL) tablet 50 mg (has no administration in time range)  hydrocortisone sodium succinate (SOLU-CORTEF) 100 MG injection 100 mg (100 mg Intravenous Given 12/15/21 2102)  albuterol (PROVENTIL) (2.5 MG/3ML) 0.083% nebulizer solution 2.5 mg (has no administration in time range)  nicotine (NICODERM CQ - dosed in mg/24 hours) patch 21 mg (21 mg Transdermal Not Given 12/15/21 2201)  feeding supplement (ENSURE ENLIVE / ENSURE PLUS) liquid 237 mL (has no administration in time range)  0.9 %  sodium chloride infusion (250 mLs Intravenous Not Given 12/15/21 2346)  norepinephrine (LEVOPHED) 4mg  in 254mL (0.016 mg/mL) premix infusion (10 mcg/min Intravenous New Bag/Given 12/15/21 2339)  sodium chloride 0.9 % bolus 1,000 mL (0 mLs Intravenous Stopped 12/15/21 1949)  sodium chloride 0.9 % bolus 1,000 mL (0 mLs Intravenous Stopped 12/15/21 1949)  iohexol (OMNIPAQUE) 350 MG/ML injection 100 mL (80 mLs Intravenous Contrast Given  12/15/21 1838)  ceFEPIme (MAXIPIME) 2 g in sodium chloride 0.9 % 100 mL IVPB (0 g Intravenous Stopped 12/15/21 2010)  vancomycin (VANCOCIN) IVPB 1000 mg/200 mL premix (0 mg Intravenous Stopped 12/15/21 2122)  albumin human 25 % solution 12.5 g (0 g Intravenous Stopped 12/15/21 2157)  albumin human 25 % solution 12.5 g (12.5 g Intravenous New Bag/Given 12/16/21 0030)    ED Course/ Medical Decision Making/ A&P Clinical Course as of 12/16/21 0039  Tue Dec 15, 2021  1641 Counseled patient for approximately 4 minutes regarding smoking cessation. Discussed risks of smoking and how they applied and affected their visit here today. Patient not ready to quit at this time, however will follow up with their primary doctor when they are.   CPT code: [phone]: intermediate counseling for smoking cessation    [SG]    Clinical Course User Index [SG] Jeanell Sparrow, DO                           Medical Decision Making   CC:  failure to thrive, dehydration   This patient complains of above; this involves an extensive number of treatment options and is a complaint that carries with it a high risk of complications and morbidity. Vital signs were reviewed. Serious etiologies considered.  Record review:   Previous records obtained and reviewed   Additional history obtained from caretaker   Work up as above, notable for:   Labs & imaging results that were available during my care of the patient were reviewed by me and considered in my medical decision making.   Low risk well's score, dimer elevated, CTPe ordered/  I ordered imaging studies which included CT pe, CT a/p and I independently visualized and interpreted imaging which showed pancolitis, multiple pulmonary nodules.  Patient with multiple episodes diarrhea over the past 24 hours.  No recent antibiotic use.  Pancolitis on CT.  Leukocytosis on CBC.  There is concern for possible C. difficile.  C. difficile testing and precautions ordered.   Patient  is persistently hypotensive, blood pressure improving with IV fluids.  Mentation is appropriate, radial pulses palpable.   Patient started on albumin infusion, further IV fluid infusion.  Do not believe patient requires vasopressors at this time given lack of acute distress, stable mental status, pt conversant and without complaints  currently.  Recommend supportive care with albumin, IV fluids.   D/w hospitalist who accepts pt for admission  start CIWA given daily etoh abuse. Thiamine/folic acid ordered.  Advised patient follow-up with PCP regarding pulmonary nodules for repeat imaging in 12 months    This chart was dictated using voice recognition software.  Despite best efforts to proofread,  errors can occur which can change the documentation meaning. Final Clinical Impression(s) / ED Diagnoses Final diagnoses:  Dehydration  Pancolitis ([place])  Alcohol abuse  Failure to thrive in adult  Pulmonary nodules    Rx / DC Orders ED Discharge Orders     None         Jeanell Sparrow, DO 12/16/21 [place], [place], DO 12/16/21 [id number]

## 2021-12-15 NOTE — ED Notes (Signed)
Notified Dr Wallace Cullens of pt's continued low BP. Orders given to re-check manual. Manual confirms same. New orders given and carried out accordingly.

## 2021-12-15 NOTE — ED Notes (Signed)
Patient transported to CT 

## 2021-12-15 NOTE — ED Notes (Signed)
Date and time results received: 12/15/21 1649   Test: Lactic Acid Critical Value: 2.2  Name of Provider Notified: Wynona Dove  Orders Received? Or Actions Taken?: See new orders

## 2021-12-15 NOTE — ED Notes (Signed)
Notified Dr Clearence Ped of pt's continued low BP despite previous interventions. Order for manual BP. Manual BP taken and confirms low BP as reported. Awaiting further orders.

## 2021-12-15 NOTE — Progress Notes (Signed)
Pharmacy Antibiotic Note  Spencer Hickman is a 66 y.o. male admitted on 12/15/2021 with sepsis.  Pharmacy has been consulted for Vancomycin and Cefepime dosing.  Plan: Cefepime 2gm IV q12h Vancomycin 1000 mg IV Q 24 hrs. Goal AUC 400-550. Expected AUC: 450, SCr used: 1.07 Will f/u renal function, micro data, and pt's clinical condition Vanc levels prn   Weight: 56 kg (123 lb 7.3 oz)  Temp (24hrs), Avg:97.6 F (36.4 C), Min:97.6 F (36.4 C), Max:97.6 F (36.4 C)  Recent Labs  Lab 12/15/21 1607 12/15/21 1817  WBC 16.1*  --   CREATININE 1.07  --   LATICACIDVEN 2.2* 2.4*    CrCl cannot be calculated (Unknown ideal weight.).    No Known Allergies  Antimicrobials this admission: 1/3 Vanc >>  1/3 Cefepime >>   Microbiology results: Pending  Thank you for allowing pharmacy to be a part of this patients care.  Sherlon Handing, PharmD, BCPS Please see amion for complete clinical pharmacist phone list 12/15/2021 7:09 PM

## 2021-12-16 ENCOUNTER — Inpatient Hospital Stay (HOSPITAL_COMMUNITY): Payer: PPO

## 2021-12-16 DIAGNOSIS — K529 Noninfective gastroenteritis and colitis, unspecified: Secondary | ICD-10-CM | POA: Diagnosis not present

## 2021-12-16 DIAGNOSIS — A419 Sepsis, unspecified organism: Secondary | ICD-10-CM | POA: Diagnosis not present

## 2021-12-16 DIAGNOSIS — R601 Generalized edema: Secondary | ICD-10-CM | POA: Diagnosis not present

## 2021-12-16 DIAGNOSIS — J810 Acute pulmonary edema: Secondary | ICD-10-CM | POA: Diagnosis not present

## 2021-12-16 DIAGNOSIS — F1010 Alcohol abuse, uncomplicated: Secondary | ICD-10-CM | POA: Diagnosis not present

## 2021-12-16 DIAGNOSIS — E43 Unspecified severe protein-calorie malnutrition: Secondary | ICD-10-CM | POA: Diagnosis not present

## 2021-12-16 LAB — CBC WITH DIFFERENTIAL/PLATELET
Abs Immature Granulocytes: 0.1 10*3/uL — ABNORMAL HIGH (ref 0.00–0.07)
Basophils Absolute: 0 10*3/uL (ref 0.0–0.1)
Basophils Relative: 0 %
Eosinophils Absolute: 0 10*3/uL (ref 0.0–0.5)
Eosinophils Relative: 0 %
HCT: 26 % — ABNORMAL LOW (ref 39.0–52.0)
Hemoglobin: 9.1 g/dL — ABNORMAL LOW (ref 13.0–17.0)
Immature Granulocytes: 1 %
Lymphocytes Relative: 2 %
Lymphs Abs: 0.4 10*3/uL — ABNORMAL LOW (ref 0.7–4.0)
MCH: 36.7 pg — ABNORMAL HIGH (ref 26.0–34.0)
MCHC: 35 g/dL (ref 30.0–36.0)
MCV: 104.8 fL — ABNORMAL HIGH (ref 80.0–100.0)
Monocytes Absolute: 0.2 10*3/uL (ref 0.1–1.0)
Monocytes Relative: 1 %
Neutro Abs: 14.4 10*3/uL — ABNORMAL HIGH (ref 1.7–7.7)
Neutrophils Relative %: 96 %
Platelets: 128 10*3/uL — ABNORMAL LOW (ref 150–400)
RBC: 2.48 MIL/uL — ABNORMAL LOW (ref 4.22–5.81)
RDW: 15.9 % — ABNORMAL HIGH (ref 11.5–15.5)
WBC: 15.1 10*3/uL — ABNORMAL HIGH (ref 4.0–10.5)
nRBC: 0 % (ref 0.0–0.2)

## 2021-12-16 LAB — FERRITIN: Ferritin: 459 ng/mL — ABNORMAL HIGH (ref 24–336)

## 2021-12-16 LAB — ECHOCARDIOGRAM COMPLETE
AR max vel: 2.18 cm2
AV Area VTI: 2.66 cm2
AV Area mean vel: 2.7 cm2
AV Mean grad: 1 mmHg
AV Peak grad: 2.4 mmHg
Ao pk vel: 0.78 m/s
Area-P 1/2: 7.16 cm2
Height: 66 in
MV VTI: 2.1 cm2
S' Lateral: 2.3 cm
Weight: 1975.32 oz

## 2021-12-16 LAB — COMPREHENSIVE METABOLIC PANEL
ALT: 23 U/L (ref 0–44)
AST: 32 U/L (ref 15–41)
Albumin: 2.5 g/dL — ABNORMAL LOW (ref 3.5–5.0)
Alkaline Phosphatase: 72 U/L (ref 38–126)
Anion gap: 7 (ref 5–15)
BUN: 14 mg/dL (ref 8–23)
CO2: 21 mmol/L — ABNORMAL LOW (ref 22–32)
Calcium: 7.4 mg/dL — ABNORMAL LOW (ref 8.9–10.3)
Chloride: 107 mmol/L (ref 98–111)
Creatinine, Ser: 0.85 mg/dL (ref 0.61–1.24)
GFR, Estimated: >60 mL/min (ref >60)
Glucose, Bld: 138 mg/dL — ABNORMAL HIGH (ref 70–99)
Potassium: 3.5 mmol/L (ref 3.5–5.1)
Sodium: 135 mmol/L (ref 135–145)
Total Bilirubin: 1.4 mg/dL — ABNORMAL HIGH (ref 0.3–1.2)
Total Protein: 5.1 g/dL — ABNORMAL LOW (ref 6.5–8.1)

## 2021-12-16 LAB — URINALYSIS, ROUTINE W REFLEX MICROSCOPIC
Bilirubin Urine: NEGATIVE
Glucose, UA: NEGATIVE mg/dL
Hgb urine dipstick: NEGATIVE
Ketones, ur: NEGATIVE mg/dL
Leukocytes,Ua: NEGATIVE
Nitrite: NEGATIVE
Protein, ur: NEGATIVE mg/dL
Specific Gravity, Urine: 1.017 (ref 1.005–1.030)
pH: 6 (ref 5.0–8.0)

## 2021-12-16 LAB — CORTISOL-AM, BLOOD: Cortisol - AM: 11.3 ug/dL (ref 6.7–22.6)

## 2021-12-16 LAB — PROCALCITONIN: Procalcitonin: 0.12 ng/mL

## 2021-12-16 LAB — PROTIME-INR
INR: 1.5 — ABNORMAL HIGH (ref 0.8–1.2)
Prothrombin Time: 18.5 seconds — ABNORMAL HIGH (ref 11.4–15.2)

## 2021-12-16 LAB — RETICULOCYTES
Immature Retic Fract: 16.7 % — ABNORMAL HIGH (ref 2.3–15.9)
RBC.: 2.47 MIL/uL — ABNORMAL LOW (ref 4.22–5.81)
Retic Count, Absolute: 41 10*3/uL (ref 19.0–186.0)
Retic Ct Pct: 1.7 % (ref 0.4–3.1)

## 2021-12-16 LAB — MAGNESIUM: Magnesium: 1.6 mg/dL — ABNORMAL LOW (ref 1.7–2.4)

## 2021-12-16 LAB — IRON AND TIBC: Iron: 32 ug/dL — ABNORMAL LOW (ref 45–182)

## 2021-12-16 LAB — VITAMIN B12: Vitamin B-12: 382 pg/mL (ref 180–914)

## 2021-12-16 LAB — HIV ANTIBODY (ROUTINE TESTING W REFLEX): HIV Screen 4th Generation wRfx: NONREACTIVE

## 2021-12-16 LAB — FOLATE: Folate: 17.5 ng/mL (ref >5.9)

## 2021-12-16 LAB — LACTIC ACID, PLASMA: Lactic Acid, Venous: 1.4 mmol/L (ref 0.5–1.9)

## 2021-12-16 MED ORDER — MIDODRINE HCL 5 MG PO TABS
10.0000 mg | ORAL_TABLET | Freq: Three times a day (TID) | ORAL | Status: DC
  Administered 2021-12-16 – 2021-12-18 (×9): 10 mg via ORAL
  Filled 2021-12-16 (×8): qty 2

## 2021-12-16 MED ORDER — MIDODRINE HCL 5 MG PO TABS
ORAL_TABLET | ORAL | Status: AC
  Filled 2021-12-16: qty 2

## 2021-12-16 MED ORDER — HEPARIN (PORCINE) 25000 UT/250ML-% IV SOLN
1200.0000 [IU]/h | INTRAVENOUS | Status: DC
  Administered 2021-12-16: 950 [IU]/h via INTRAVENOUS
  Administered 2021-12-17: 1200 [IU]/h via INTRAVENOUS
  Filled 2021-12-16 (×2): qty 250

## 2021-12-16 MED ORDER — SODIUM CHLORIDE 0.9 % IV SOLN
2.0000 g | Freq: Three times a day (TID) | INTRAVENOUS | Status: DC
  Administered 2021-12-16: 2 g via INTRAVENOUS
  Filled 2021-12-16: qty 2

## 2021-12-16 MED ORDER — SODIUM CHLORIDE 0.9 % IV BOLUS
500.0000 mL | Freq: Once | INTRAVENOUS | Status: AC
  Administered 2021-12-16: 500 mL via INTRAVENOUS

## 2021-12-16 NOTE — ED Notes (Signed)
Changed out urine canister ?

## 2021-12-16 NOTE — Progress Notes (Incomplete)
*  PRELIMINARY RESULTS* Echocardiogram 2D Echocardiogram has been performed.  Spencer Hickman 12/16/2021, 12:31 PM

## 2021-12-16 NOTE — Progress Notes (Addendum)
PROGRESS NOTE   Spencer Hickman  [id number] DOB: Nov 17, 1956 DOA: 12/15/2021 PCP: Neale Burly, MD   Chief Complaint  Patient presents with   Hypotension   Level of care: Stepdown  Brief Admission History:   66 y.o. male, with history of alcohol abuse, COPD, tobacco use disorder, coronary artery disease, hypertension and more presents ED with chief complaint of diarrhea.  Patient reports he had diarrhea intermittently for 2 to 3 weeks.  This current episode has been lasting for 3 to 5 days.  He reports some bowel incontinence.  He has associated dizziness upon standing, weakness, fatigue.  He reports his episodes of diarrhea are 2+ times per day.  They are nonbloody.  His stool is black but it has been ever since he started taking iron.  He denies any tarry stools.  He reports the stools are malodorous.  He has had no fever, no dysuria, no cough, no rashes.  He reports that today he came in because home health took his blood pressures that he was hypotensive, he had had some bowel incontinence, so they sent him into the ED.  Patient reports he did take his medications today including Cardizem.  He has not had any pain.  He has not had any emesis.  He reports that he has felt fine.  No recent antibiotic use.  No recent travel.  Patient reports that he is barely been out of his home for the last 3 years secondary to pandemic.  He has no history of C. difficile.  Patient does report a decrease in p.o. intake secondary to decrease in appetite.  He reports significant weight loss, possibly 60 pounds.  He was drinking a 12 pack of beer per day, but due to this decreased appetite he has only been drinking 2-4 beers per day.  He has barely any food intake.  Patient has no other complaints at this time.  Assessment & Plan:   Principal Problem:   Sepsis ([place]) Active Problems:   Tobacco abuse   Protein-calorie malnutrition, severe   Protein calorie malnutrition ([place])   Alcohol abuse    Colitis   Sepsis secondary to pancolitis - continue IV antibiotics and supportive measures  - trend lactic acid  - IV fluid hydration - follow cultures  Pancolitis - continue IV antibiotics - continue supportive measures  Chronic alcohol abuse  - CIWA protocol - IV fluids and vitamins ordered   Acute pulmonary edema  - follow up Echo  Tobacco  - nicotine patch ordered   Hypotension  - wean off norepinephrine as able   LLE edema  - check venous doppler--->acute/chronic DVT - start heparin IV - DOAC when able to take oral  - place TED hoses   DVT prophylaxis: IV heparin Code Status: full  Family Communication: none present  Disposition: TBD  Status is: Inpatient  Remains inpatient appropriate because: IV pressor support needed     Consultants:  Critical care - due to pressors  Procedures:    Antimicrobials:     Subjective: Pt remains somnolent but arousable  Objective: Vitals:   12/16/21 1645 12/16/21 1700 12/16/21 1715 12/16/21 1730  BP: 101/68 101/70 102/77 99/73  Pulse: 76 69 95 87  Resp: (!) 21 16 (!) 21 18  Temp:      TempSrc:      SpO2: 98% 97% 96% 94%  Weight:      Height:        Intake/Output Summary (Last 24 hours) at 12/16/2021 1744  Last data filed at 12/16/2021 1742 Gross per 24 hour  Intake 4403.55 ml  Output 825 ml  Net 3578.55 ml   Filed Weights   12/15/21 1857  Weight: 56 kg    Examination:  General exam: thin, chronically ill appearing male, Appears calm and comfortable  Respiratory system: Clear to auscultation. Respiratory effort normal. Cardiovascular system: normal S1 & S2 heard. No JVD, murmurs, rubs, gallops or clicks. No pedal edema. Gastrointestinal system: Abdomen is nondistended, soft and nontender. No organomegaly or masses felt. Normal bowel sounds heard. Central nervous system: Alert and oriented. No focal neurological deficits. Extremities: Symmetric 5 x 5 power. Skin: No rashes, lesions or  ulcers Psychiatry: Judgement and insight appear poor. Mood & affect appropriate.   Data Reviewed: I have personally reviewed following labs and imaging studies  CBC: Recent Labs  Lab 12/15/21 1607 12/16/21 0343  WBC 16.1* 15.1*  NEUTROABS 11.8* 14.4*  HGB 10.4* 9.1*  HCT 29.9* 26.0*  MCV 103.8* 104.8*  PLT 135* 128*    Basic Metabolic Panel: Recent Labs  Lab 12/15/21 1607 12/16/21 0343  NA 131* 135  K 3.6 3.5  CL 98 107  CO2 24 21*  GLUCOSE 110* 138*  BUN 15 14  CREATININE 1.07 0.85  CALCIUM 7.4* 7.4*  MG  --  1.6*    GFR: Estimated Creatinine Clearance: 66.8 mL/min (by C-G formula based on SCr of 0.85 mg/dL).  Liver Function Tests: Recent Labs  Lab 12/15/21 1607 12/16/21 0343  AST 36 32  ALT 29 23  ALKPHOS 98 72  BILITOT 1.5* 1.4*  PROT 5.0* 5.1*  ALBUMIN 1.5* 2.5*    CBG: No results for input(s): GLUCAP in the last 168 hours.  Recent Results (from the past 240 hour(s))  Resp Panel by RT-PCR (Flu A&B, Covid) Nasopharyngeal Swab     Status: None   Collection Time: 12/15/21  5:05 PM   Specimen: Nasopharyngeal Swab; Nasopharyngeal(NP) swabs in vial transport medium  Result Value Ref Range Status   SARS Coronavirus 2 by RT PCR NEGATIVE NEGATIVE Final    Comment: (NOTE) SARS-CoV-2 target nucleic acids are NOT DETECTED.  The SARS-CoV-2 RNA is generally detectable in upper respiratory specimens during the acute phase of infection. The lowest concentration of SARS-CoV-2 viral copies this assay can detect is 138 copies/mL. A negative result does not preclude SARS-Cov-2 infection and should not be used as the sole basis for treatment or other patient management decisions. A negative result may occur with  improper specimen collection/handling, submission of specimen other than nasopharyngeal swab, presence of viral mutation(s) within the areas targeted by this assay, and inadequate number of viral copies(<138 copies/mL). A negative result must be combined  with clinical observations, patient history, and epidemiological information. The expected result is Negative.  Fact Sheet for Patients:  EntrepreneurPulse.com.au  Fact Sheet for Healthcare Providers:  IncredibleEmployment.be  This test is no t yet approved or cleared by the [place] FDA and  has been authorized for detection and/or diagnosis of SARS-CoV-2 by FDA under an Emergency Use Authorization (EUA). This EUA will remain  in effect (meaning this test can be used) for the duration of the COVID-19 declaration under Section 564(b)(1) of the Act, 21 U.S.C.section 360bbb-3(b)(1), unless the authorization is terminated  or revoked sooner.       Influenza A by PCR NEGATIVE NEGATIVE Final   Influenza B by PCR NEGATIVE NEGATIVE Final    Comment: (NOTE) The Xpert Xpress SARS-CoV-2/FLU/RSV plus assay is intended as an aid  in the diagnosis of influenza from Nasopharyngeal swab specimens and should not be used as a sole basis for treatment. Nasal washings and aspirates are unacceptable for Xpert Xpress SARS-CoV-2/FLU/RSV testing.  Fact Sheet for Patients: EntrepreneurPulse.com.au  Fact Sheet for Healthcare Providers: IncredibleEmployment.be  This test is not yet approved or cleared by the [place] FDA and has been authorized for detection and/or diagnosis of SARS-CoV-2 by FDA under an Emergency Use Authorization (EUA). This EUA will remain in effect (meaning this test can be used) for the duration of the COVID-19 declaration under Section 564(b)(1) of the Act, 21 U.S.C. section 360bbb-3(b)(1), unless the authorization is terminated or revoked.  Performed at [facility], [address] [place], [place] [address]   Blood culture (routine x 2)     Status: None (Preliminary result)   Collection Time: 12/15/21  7:19 PM   Specimen: BLOOD LEFT HAND  Result Value Ref Range Status   Specimen Description  BLOOD LEFT HAND  Final   Special Requests   Final    BOTTLES DRAWN AEROBIC AND ANAEROBIC Blood Culture results may not be optimal due to an inadequate volume of blood received in culture bottles   Culture   Final    NO GROWTH < 12 HOURS Performed at [facility], [address] [place], [place] [address]    Report Status PENDING  Incomplete  Blood culture (routine x 2)     Status: None (Preliminary result)   Collection Time: 12/15/21  7:19 PM   Specimen: BLOOD RIGHT HAND  Result Value Ref Range Status   Specimen Description BLOOD RIGHT HAND  Final   Special Requests   Final    BOTTLES DRAWN AEROBIC ONLY Blood Culture results may not be optimal due to an inadequate volume of blood received in culture bottles   Culture   Final    NO GROWTH < 12 HOURS Performed at [facility], [address], [place], [place] [address]    Report Status PENDING  Incomplete     Radiology Studies: CT Angio Chest PE W and/or Wo Contrast  Result Date: 12/15/2021 CLINICAL DATA:  Pulmonary embolism (PE) suspected, positive D-dimer; Abdominal pain, acute, nonlocalized EXAM: CT ANGIOGRAPHY CHEST CT ABDOMEN AND PELVIS WITH CONTRAST TECHNIQUE: Multidetector CT imaging of the chest was performed using the standard protocol during bolus administration of intravenous contrast. Multiplanar CT image reconstructions and MIPs were obtained to evaluate the vascular anatomy. Multidetector CT imaging of the abdomen and pelvis was performed using the standard protocol during bolus administration of intravenous contrast. CONTRAST:  61mL OMNIPAQUE IOHEXOL 350 MG/ML SOLN COMPARISON:  None. FINDINGS: CTA CHEST FINDINGS Cardiovascular: Satisfactory opacification of the pulmonary arteries to the segmental level. No evidence of pulmonary embolism. Normal heart size. No significant pericardial effusion. The thoracic aorta is normal in caliber. Mild-to-moderate atherosclerotic plaque of the thoracic aorta. Aortic valve leaflet calcifications.  Three vessel coronary artery calcifications. Mediastinum/Nodes: No enlarged mediastinal, hilar, or axillary lymph nodes. Thyroid gland, trachea, and esophagus demonstrate no significant findings. Trace debris within the trachea. Lungs/Pleura: Mild interlobular septal wall thickening most prominent within bilateral upper lobes. Diffuse at least moderate centrilobular emphysematous changes. No focal consolidation. Few scattered pulmonary micronodules. No pulmonary mass. No pleural effusion. No pneumothorax. Musculoskeletal: No chest wall abnormality. No suspicious lytic or blastic osseous lesions. No acute displaced fracture. Multilevel degenerative changes of the spine. Review of the MIP images confirms the above findings. CT ABDOMEN and PELVIS FINDINGS Hepatobiliary: No focal liver abnormality. No gallstones, gallbladder wall  thickening, or pericholecystic fluid. No biliary dilatation. Pancreas: No focal lesion. Normal pancreatic contour. No surrounding inflammatory changes. No main pancreatic ductal dilatation. Spleen: Normal in size without focal abnormality. Adrenals/Urinary Tract: No adrenal nodule bilaterally. Bilateral kidneys enhance symmetrically. No hydronephrosis. No hydroureter. The urinary bladder is unremarkable. Stomach/Bowel: Stomach is within normal limits. No evidence of small bowel wall thickening or dilatation. Diffuse bowel wall thickening of the colon with associated mucosal hyperemia and trace pericolonic fat stranding. Fluid density noted within the lumen of the large bowel. Appendix appears normal. Vascular/Lymphatic: No abdominal aorta or iliac aneurysm. Severe atherosclerotic plaque of the aorta and its branches. No abdominal, pelvic, or inguinal lymphadenopathy. Reproductive: Prostate is unremarkable. Other: Trace volume free fluid within the pelvis. No intraperitoneal free gas. No organized fluid collection. Musculoskeletal: No abdominal wall hernia or abnormality. No suspicious lytic or  blastic osseous lesions. No acute displaced fracture. Multilevel degenerative changes of the spine. Review of the MIP images confirms the above findings. IMPRESSION: 1. No pulmonary embolus. 2. Mild pulmonary edema. 3. Trace right pleural effusion. 4. Pancolitis. 5. Aortic Atherosclerosis (ICD10-I70.0) and Emphysema (ICD10-J43.9). 6. Few scattered pulmonary micronodules. No follow-up needed if patient is low-risk (and has no known or suspected primary neoplasm). Non-contrast chest CT can be considered in 12 months if patient is high-risk. This recommendation follows the consensus statement: Guidelines for Management of Incidental Pulmonary Nodules Detected on CT Images: From the Fleischner Society 2017; Radiology 2017; 284:228-243. Electronically Signed   By: Iven Finn M.D.   On: 12/15/2021 18:52   CT ABDOMEN PELVIS W CONTRAST  Result Date: 12/15/2021 CLINICAL DATA:  Pulmonary embolism (PE) suspected, positive D-dimer; Abdominal pain, acute, nonlocalized EXAM: CT ANGIOGRAPHY CHEST CT ABDOMEN AND PELVIS WITH CONTRAST TECHNIQUE: Multidetector CT imaging of the chest was performed using the standard protocol during bolus administration of intravenous contrast. Multiplanar CT image reconstructions and MIPs were obtained to evaluate the vascular anatomy. Multidetector CT imaging of the abdomen and pelvis was performed using the standard protocol during bolus administration of intravenous contrast. CONTRAST:  82mL OMNIPAQUE IOHEXOL 350 MG/ML SOLN COMPARISON:  None. FINDINGS: CTA CHEST FINDINGS Cardiovascular: Satisfactory opacification of the pulmonary arteries to the segmental level. No evidence of pulmonary embolism. Normal heart size. No significant pericardial effusion. The thoracic aorta is normal in caliber. Mild-to-moderate atherosclerotic plaque of the thoracic aorta. Aortic valve leaflet calcifications. Three vessel coronary artery calcifications. Mediastinum/Nodes: No enlarged mediastinal, hilar, or  axillary lymph nodes. Thyroid gland, trachea, and esophagus demonstrate no significant findings. Trace debris within the trachea. Lungs/Pleura: Mild interlobular septal wall thickening most prominent within bilateral upper lobes. Diffuse at least moderate centrilobular emphysematous changes. No focal consolidation. Few scattered pulmonary micronodules. No pulmonary mass. No pleural effusion. No pneumothorax. Musculoskeletal: No chest wall abnormality. No suspicious lytic or blastic osseous lesions. No acute displaced fracture. Multilevel degenerative changes of the spine. Review of the MIP images confirms the above findings. CT ABDOMEN and PELVIS FINDINGS Hepatobiliary: No focal liver abnormality. No gallstones, gallbladder wall thickening, or pericholecystic fluid. No biliary dilatation. Pancreas: No focal lesion. Normal pancreatic contour. No surrounding inflammatory changes. No main pancreatic ductal dilatation. Spleen: Normal in size without focal abnormality. Adrenals/Urinary Tract: No adrenal nodule bilaterally. Bilateral kidneys enhance symmetrically. No hydronephrosis. No hydroureter. The urinary bladder is unremarkable. Stomach/Bowel: Stomach is within normal limits. No evidence of small bowel wall thickening or dilatation. Diffuse bowel wall thickening of the colon with associated mucosal hyperemia and trace pericolonic fat stranding. Fluid density noted within the lumen  of the large bowel. Appendix appears normal. Vascular/Lymphatic: No abdominal aorta or iliac aneurysm. Severe atherosclerotic plaque of the aorta and its branches. No abdominal, pelvic, or inguinal lymphadenopathy. Reproductive: Prostate is unremarkable. Other: Trace volume free fluid within the pelvis. No intraperitoneal free gas. No organized fluid collection. Musculoskeletal: No abdominal wall hernia or abnormality. No suspicious lytic or blastic osseous lesions. No acute displaced fracture. Multilevel degenerative changes of the spine.  Review of the MIP images confirms the above findings. IMPRESSION: 1. No pulmonary embolus. 2. Mild pulmonary edema. 3. Trace right pleural effusion. 4. Pancolitis. 5. Aortic Atherosclerosis (ICD10-I70.0) and Emphysema (ICD10-J43.9). 6. Few scattered pulmonary micronodules. No follow-up needed if patient is low-risk (and has no known or suspected primary neoplasm). Non-contrast chest CT can be considered in 12 months if patient is high-risk. This recommendation follows the consensus statement: Guidelines for Management of Incidental Pulmonary Nodules Detected on CT Images: From the Fleischner Society 2017; Radiology 2017; 284:228-243. Electronically Signed   By: Iven Finn M.D.   On: 12/15/2021 18:52   US Venous Img Lower Unilateral Left (DVT)  Result Date: 12/16/2021 CLINICAL DATA:  Pain and swelling, positive D-dimer EXAM: Left LOWER EXTREMITY VENOUS DOPPLER ULTRASOUND TECHNIQUE: Gray-scale sonography with compression, as well as color and duplex ultrasound, were performed to evaluate the deep venous system(s) from the level of the common femoral vein through the popliteal and proximal calf veins. COMPARISON:  None. FINDINGS: VENOUS Findings suggest acute or chronic DVT in the left peroneal and posterior tibial veins. There is no evidence of acute DVT in the left common femoral, femoral and popliteal veins. Limited views of the contralateral common femoral vein are unremarkable. OTHER None. Limitations: none IMPRESSION: Findings suggest acute or chronic DVT in the left peroneal and posterior tibial veins. Electronically Signed   By: Elmer Picker M.D.   On: 12/16/2021 09:29   ECHOCARDIOGRAM COMPLETE  Result Date: 12/16/2021    ECHOCARDIOGRAM REPORT   Patient Name:   Spencer Hickman [facility] Date of Exam: 12/16/2021 Medical Rec #:  [id number]         Height:       66.0 in Accession #:    [id number]        Weight:       123.5 lb Date of Birth:  Jul 24, 1956         BSA:          1.629 m Patient Age:    53 years           BP:           79/57 mmHg Patient Gender: M                 HR:           74 bpm. Exam Location:  [facility] Procedure: 2D Echo, Cardiac Doppler and Color Doppler Indications:    Pulmonary Edema  History:        Patient has prior history of Echocardiogram examinations, most                 recent 02/05/2019. CAD, COPD; Signs/Symptoms:Chest Pain and                 Shortness of Breath. Tobacco and ETOH abuse.  Sonographer:    Wenda Low Referring Phys: [id number] ASIA B Slinger  Sonographer Comments: Technically difficult study due to poor echo windows and suboptimal apical window. Image acquisition challenging due to patient body habitus. IMPRESSIONS  1. Left ventricular ejection  fraction, by estimation, is 60 to 65%. The left ventricle has normal function. The left ventricle has no regional wall motion abnormalities. Left ventricular diastolic parameters are consistent with Grade III diastolic dysfunction (restrictive). Elevated left atrial pressure.  2. Right ventricular systolic function is normal. The right ventricular size is normal. Tricuspid regurgitation signal is inadequate for assessing PA pressure.  3. The mitral valve is normal in structure. No evidence of mitral valve regurgitation. No evidence of mitral stenosis.  4. The aortic valve was not well visualized. There is moderate calcification of the aortic valve. There is moderate thickening of the aortic valve. Aortic valve regurgitation is not visualized. No aortic stenosis is present.  5. The inferior vena cava is normal in size with greater than 50% respiratory variability, suggesting right atrial pressure of 3 mmHg. FINDINGS  Left Ventricle: Left ventricular ejection fraction, by estimation, is 60 to 65%. The left ventricle has normal function. The left ventricle has no regional wall motion abnormalities. The left ventricular internal cavity size was normal in size. There is  no left ventricular hypertrophy. Left ventricular diastolic  parameters are consistent with Grade III diastolic dysfunction (restrictive). Elevated left atrial pressure. Right Ventricle: The right ventricular size is normal. No increase in right ventricular wall thickness. Right ventricular systolic function is normal. Tricuspid regurgitation signal is inadequate for assessing PA pressure. Left Atrium: Left atrial size was not well visualized. Right Atrium: Right atrial size was not well visualized. Pericardium: There is no evidence of pericardial effusion. Mitral Valve: The mitral valve is normal in structure. No evidence of mitral valve regurgitation. No evidence of mitral valve stenosis. MV peak gradient, 5.5 mmHg. The mean mitral valve gradient is 1.0 mmHg. Tricuspid Valve: The tricuspid valve is not well visualized. Tricuspid valve regurgitation is mild . No evidence of tricuspid stenosis. Aortic Valve: The aortic valve was not well visualized. There is moderate calcification of the aortic valve. There is moderate thickening of the aortic valve. There is moderate aortic valve annular calcification. Aortic valve regurgitation is not visualized. No aortic stenosis is present. Aortic valve mean gradient measures 1.0 mmHg. Aortic valve peak gradient measures 2.4 mmHg. Aortic valve area, by VTI measures 2.66 cm. Pulmonic Valve: The pulmonic valve was not well visualized. Pulmonic valve regurgitation is not visualized. No evidence of pulmonic stenosis. Aorta: The aortic root is normal in size and structure. Venous: The inferior vena cava is normal in size with greater than 50% respiratory variability, suggesting right atrial pressure of 3 mmHg. IAS/Shunts: No atrial level shunt detected by color flow Doppler.  LEFT VENTRICLE PLAX 2D LVIDd:         3.30 cm   Diastology LVIDs:         2.30 cm   LV e' medial:    6.74 cm/s LV PW:         0.80 cm   LV E/e' medial:  17.4 LV IVS:        0.80 cm   LV e' lateral:   8.05 cm/s LVOT diam:     2.00 cm   LV E/e' lateral: 14.5 LV SV:          48 LV SV Index:   30 LVOT Area:     3.14 cm  LEFT ATRIUM           Index LA diam:      3.30 cm 2.03 cm/m LA Vol (A4C): 38.5 ml 23.63 ml/m  AORTIC VALVE AV Area (Vmax):  2.18 cm AV Area (Vmean):   2.70 cm AV Area (VTI):     2.66 cm AV Vmax:           78.20 cm/s AV Vmean:          41.000 cm/s AV VTI:            0.181 m AV Peak Grad:      2.4 mmHg AV Mean Grad:      1.0 mmHg LVOT Vmax:         54.20 cm/s LVOT Vmean:        35.200 cm/s LVOT VTI:          0.153 m LVOT/AV VTI ratio: 0.85  AORTA Ao Root diam: 3.30 cm Ao Asc diam:  3.80 cm MITRAL VALVE MV Area (PHT): 7.16 cm     SHUNTS MV Area VTI:   2.10 cm     Systemic VTI:  0.15 m MV Peak grad:  5.5 mmHg     Systemic Diam: 2.00 cm MV Mean grad:  1.0 mmHg MV Vmax:       1.17 m/s MV Vmean:      36.9 cm/s MV Decel Time: 106 msec MV E velocity: 117.00 cm/s MV A velocity: 33.00 cm/s MV E/A ratio:  3.55 Carlyle Dolly MD Electronically signed by Carlyle Dolly MD Signature Date/Time: 12/16/2021/1:07:47 PM    Final     Scheduled Meds:  feeding supplement  237 mL Oral BID BM   ferrous sulfate  325 mg Oral Q breakfast   folic acid  1 mg Oral Daily   folic acid  1 mg Intravenous Once   heparin  5,000 Units Subcutaneous Q8H   midodrine  10 mg Oral TID WC   mirtazapine  15 mg Oral QHS   multivitamin with minerals  1 tablet Oral Daily   nicotine  21 mg Transdermal Daily   ondansetron  4 mg Intravenous Once   QUEtiapine  25 mg Oral QHS   thiamine injection  100 mg Intravenous Daily   thiamine  100 mg Oral Daily   Or   thiamine  100 mg Intravenous Daily   umeclidinium bromide  1 puff Inhalation Daily   Continuous Infusions:  sodium chloride 100 mL/hr at 12/16/21 1409   sodium chloride     ceFEPime (MAXIPIME) IV Stopped (12/16/21 1742)   metronidazole Stopped (12/16/21 1742)   norepinephrine (LEVOPHED) Adult infusion 5 mcg/min (12/16/21 1627)   vancomycin       LOS: 1 day   Critical Care Procedure Note Authorized and Performed by: Murvin Natal MD   Total Critical Care time:  45 mins Due to a high probability of clinically significant, life threatening deterioration, the patient required my highest level of preparedness to intervene emergently and I personally spent this critical care time directly and personally managing the patient.  This critical care time included obtaining a history; examining the patient, pulse oximetry; ordering and review of studies; arranging urgent treatment with development of a management plan; evaluation of patient's response of treatment; frequent reassessment; and discussions with other providers.  This critical care time was performed to assess and manage the high probability of imminent and life threatening deterioration that could result in multi-organ failure.  It was exclusive of separately billable procedures and treating other patients and teaching time.    Irwin Brakeman, MD How to contact the [facility] Attending or Consulting provider [place] or covering provider during after hours [place], for this patient?  Check the care team in [facility] and look for a) attending/consulting [facility] provider listed and b) the [facility] team listed Log into [email or web address] and use [place]'s universal password to access. If you do not have the password, please contact the hospital operator. Locate the [facility] The provider you are looking for under [facility] and page to a number that you can be directly reached. If you still have difficulty reaching the provider, please page the [facility] (Director on Call) for the Hospitalists listed on amion for assistance.  12/16/2021, 5:44 PM

## 2021-12-16 NOTE — Progress Notes (Signed)
ANTICOAGULATION CONSULT NOTE - Initial Consult  Pharmacy Consult for heparin Indication: DVT  No Known Allergies  Patient Measurements: Height: 5\' 6"  (167.6 cm) Weight: 56 kg (123 lb 7.3 oz) IBW/kg (Calculated) : 63.8 Heparin Dosing Weight: 56 kg  Vital Signs: Temp: 97.9 F (36.6 C) (01/04 1615) Temp Source: Oral (01/04 1615) BP: 97/70 (01/04 1815) Pulse Rate: 77 (01/04 1815)  Labs: Recent Labs    12/15/21 1607 12/15/21 1817 12/16/21 0343  HGB 10.4*  --  9.1*  HCT 29.9*  --  26.0*  PLT 135*  --  128*  LABPROT  --   --  18.5*  INR  --   --  1.5*  CREATININE 1.07  --  0.85  CKTOTAL 293  --   --   TROPONINIHS 7 6  --     Estimated Creatinine Clearance: 66.8 mL/min (by C-G formula based on SCr of 0.85 mg/dL).   Medical History: Past Medical History:  Diagnosis Date   Alcohol abuse    COPD (chronic obstructive pulmonary disease) ([place])    Coronary artery disease    States "had a mild heart attack years ago.   Tobacco abuse 02/05/2019    Assessment:   66 y.o. male, with history of alcohol abuse, COPD, tobacco use disorder, coronary artery disease, hypertension. 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 Pharmacy consulted to initiate heparin. Not on anticoagulation prior to admission.  Last dose of subcutaneous heparin given at 1451. Hgb low 9.1, Plt low 128. INR 1.5.  Goal of Therapy:  Heparin level 0.3-0.7 units/ml Monitor platelets by anticoagulation protocol: Yes   Plan:  Start heparin infusion at 950 units/hr Check heparin level in 8 hours and daily while on heparin Continue to monitor H&H and platelets   Thank you for allowing [place] to participate in this patients care. Jens Som, PharmD 12/16/2021 6:20 PM  **Pharmacist phone directory can be found on [email or web address] listed under [place]**

## 2021-12-17 ENCOUNTER — Inpatient Hospital Stay (HOSPITAL_COMMUNITY): Payer: PPO

## 2021-12-17 ENCOUNTER — Other Ambulatory Visit: Payer: Self-pay

## 2021-12-17 DIAGNOSIS — T17908A Unspecified foreign body in respiratory tract, part unspecified causing other injury, initial encounter: Secondary | ICD-10-CM | POA: Diagnosis not present

## 2021-12-17 DIAGNOSIS — R0603 Acute respiratory distress: Secondary | ICD-10-CM

## 2021-12-17 DIAGNOSIS — A419 Sepsis, unspecified organism: Secondary | ICD-10-CM | POA: Diagnosis not present

## 2021-12-17 DIAGNOSIS — J432 Centrilobular emphysema: Secondary | ICD-10-CM | POA: Diagnosis not present

## 2021-12-17 DIAGNOSIS — F1010 Alcohol abuse, uncomplicated: Secondary | ICD-10-CM | POA: Diagnosis not present

## 2021-12-17 DIAGNOSIS — K529 Noninfective gastroenteritis and colitis, unspecified: Secondary | ICD-10-CM | POA: Diagnosis not present

## 2021-12-17 DIAGNOSIS — E43 Unspecified severe protein-calorie malnutrition: Secondary | ICD-10-CM | POA: Diagnosis not present

## 2021-12-17 LAB — COMPREHENSIVE METABOLIC PANEL
ALT: 24 U/L (ref 0–44)
AST: 30 U/L (ref 15–41)
Albumin: 2.1 g/dL — ABNORMAL LOW (ref 3.5–5.0)
Alkaline Phosphatase: 74 U/L (ref 38–126)
Anion gap: 7 (ref 5–15)
BUN: 10 mg/dL (ref 8–23)
CO2: 21 mmol/L — ABNORMAL LOW (ref 22–32)
Calcium: 7.5 mg/dL — ABNORMAL LOW (ref 8.9–10.3)
Chloride: 111 mmol/L (ref 98–111)
Creatinine, Ser: 0.76 mg/dL (ref 0.61–1.24)
GFR, Estimated: >60 mL/min (ref >60)
Glucose, Bld: 99 mg/dL (ref 70–99)
Potassium: 3.5 mmol/L (ref 3.5–5.1)
Sodium: 139 mmol/L (ref 135–145)
Total Bilirubin: 1.1 mg/dL (ref 0.3–1.2)
Total Protein: 4.8 g/dL — ABNORMAL LOW (ref 6.5–8.1)

## 2021-12-17 LAB — CBC WITH DIFFERENTIAL/PLATELET
Abs Immature Granulocytes: 0.16 10*3/uL — ABNORMAL HIGH (ref 0.00–0.07)
Basophils Absolute: 0 10*3/uL (ref 0.0–0.1)
Basophils Relative: 0 %
Eosinophils Absolute: 0 10*3/uL (ref 0.0–0.5)
Eosinophils Relative: 0 %
HCT: 28.1 % — ABNORMAL LOW (ref 39.0–52.0)
Hemoglobin: 10.1 g/dL — ABNORMAL LOW (ref 13.0–17.0)
Immature Granulocytes: 1 %
Lymphocytes Relative: 7 %
Lymphs Abs: 1.4 10*3/uL (ref 0.7–4.0)
MCH: 37 pg — ABNORMAL HIGH (ref 26.0–34.0)
MCHC: 35.9 g/dL (ref 30.0–36.0)
MCV: 102.9 fL — ABNORMAL HIGH (ref 80.0–100.0)
Monocytes Absolute: 0.7 10*3/uL (ref 0.1–1.0)
Monocytes Relative: 3 %
Neutro Abs: 17.1 10*3/uL — ABNORMAL HIGH (ref 1.7–7.7)
Neutrophils Relative %: 89 %
Platelets: 153 10*3/uL (ref 150–400)
RBC: 2.73 MIL/uL — ABNORMAL LOW (ref 4.22–5.81)
RDW: 16.1 % — ABNORMAL HIGH (ref 11.5–15.5)
WBC: 19.4 10*3/uL — ABNORMAL HIGH (ref 4.0–10.5)
nRBC: 0 % (ref 0.0–0.2)

## 2021-12-17 LAB — MRSA NEXT GEN BY PCR, NASAL: MRSA by PCR Next Gen: NOT DETECTED

## 2021-12-17 LAB — HEPARIN LEVEL (UNFRACTIONATED)
Heparin Unfractionated: 0.15 IU/mL — ABNORMAL LOW (ref 0.30–0.70)
Heparin Unfractionated: 0.3 IU/mL (ref 0.30–0.70)
Heparin Unfractionated: 0.5 IU/mL (ref 0.30–0.70)

## 2021-12-17 LAB — URINE CULTURE: Culture: NO GROWTH

## 2021-12-17 LAB — MAGNESIUM: Magnesium: 1.5 mg/dL — ABNORMAL LOW (ref 1.7–2.4)

## 2021-12-17 MED ORDER — CHLORHEXIDINE GLUCONATE CLOTH 2 % EX PADS
6.0000 | MEDICATED_PAD | Freq: Every day | CUTANEOUS | Status: DC
  Administered 2021-12-17 – 2021-12-18 (×2): 6 via TOPICAL

## 2021-12-17 MED ORDER — LEVOFLOXACIN 750 MG PO TABS
750.0000 mg | ORAL_TABLET | Freq: Every day | ORAL | Status: DC
  Administered 2021-12-17 – 2021-12-20 (×4): 750 mg via ORAL
  Filled 2021-12-17 (×4): qty 1

## 2021-12-17 MED ORDER — PNEUMOCOCCAL VAC POLYVALENT 25 MCG/0.5ML IJ INJ: 0.5000 mL | INJECTION | INTRAMUSCULAR | Status: DC

## 2021-12-17 MED ORDER — MAGNESIUM SULFATE 4 GM/100ML IV SOLN
4.0000 g | Freq: Once | INTRAVENOUS | Status: AC
  Administered 2021-12-17: 4 g via INTRAVENOUS
  Filled 2021-12-17: qty 100

## 2021-12-17 MED ORDER — HEPARIN BOLUS VIA INFUSION
1000.0000 [IU] | Freq: Once | INTRAVENOUS | Status: AC
  Administered 2021-12-17: 1000 [IU] via INTRAVENOUS

## 2021-12-17 NOTE — Progress Notes (Signed)
PROGRESS NOTE   Spencer Hickman  [id number] DOB: May 04, 1956 DOA: 12/15/2021 PCP: Neale Burly, MD   Chief Complaint  Patient presents with   Hypotension   Level of care: ICU  Brief Admission History:   66 y.o. male, with history of alcohol abuse, COPD, tobacco use disorder, coronary artery disease, hypertension and more presents ED with chief complaint of diarrhea.  Patient reports he had diarrhea intermittently for 2 to 3 weeks.  This current episode has been lasting for 3 to 5 days.  He reports some bowel incontinence.  He has associated dizziness upon standing, weakness, fatigue.  He reports his episodes of diarrhea are 2+ times per day.  They are nonbloody.  His stool is black but it has been ever since he started taking iron.  He denies any tarry stools.  He reports the stools are malodorous.  He has had no fever, no dysuria, no cough, no rashes.  He reports that today he came in because home health took his blood pressures that he was hypotensive, he had had some bowel incontinence, so they sent him into the ED.  Patient reports he did take his medications today including Cardizem.  He has not had any pain.  He has not had any emesis.  He reports that he has felt fine.  No recent antibiotic use.  No recent travel.  Patient reports that he is barely been out of his home for the last 3 years secondary to pandemic.  He has no history of C. difficile.  Patient does report a decrease in p.o. intake secondary to decrease in appetite.  He reports significant weight loss, possibly 60 pounds.  He was drinking a 12 pack of beer per day, but due to this decreased appetite he has only been drinking 2-4 beers per day.  He has barely any food intake.  Patient has no other complaints at this time.  Assessment & Plan:   Principal Problem:   Sepsis ([place]) Active Problems:   Tobacco abuse   Protein-calorie malnutrition, severe   Protein calorie malnutrition ([place])   Alcohol abuse   Colitis  Sepsis  secondary to pancolitis - continue IV antibiotics and supportive measures  - trend lactic acid to resolution - IV fluid hydration - follow cultures  Acute respiratory failure with hypoxia  - secondary to acute aspiration  - initially was planning to intubate emergently - fortunately patient recovered shortly afterwards  - discussed bronchoscopy with Dr. Halford Chessman  Pancolitis - continue IV antibiotics - continue supportive measures  Chronic alcohol abuse  - CIWA protocol - IV fluids and vitamins ordered   Acute pulmonary edema  - follow up Echo and chest xray  Tobacco  - nicotine patch ordered   Hypotension  - wean off norepinephrine as able   LLE edema  - check venous doppler--->acute/chronic DVT - started heparin IV - ?DOAC when able to take oral  - placed TED hose  DVT prophylaxis: IV heparin Code Status: full  Family Communication: none present  Disposition: TBD  Status is: Inpatient  Remains inpatient appropriate because: IV pressor support needed     Consultants:  Critical care - due to pressors, acute respiratory distress  Procedures:    Antimicrobials:  Levofloxacin 1/5>> Metronidazole 1/4>>  Subjective: Pt acutely aspirated a ferrous sulfate tablet this morning became tachypneic tachycardic with acute hypoxia, he was placed on NRB.     Objective: Vitals:   12/17/21 1130 12/17/21 1145 12/17/21 1200 12/17/21 1215  BP: Marland Kitchen)  88/66 (!) 79/62 (!) 86/65 (!) 89/73  Pulse: 100 93    Resp: (!) 30 (!) 25 (!) 25 (!) 26  Temp:      TempSrc:      SpO2: 99% 100%    Weight:      Height:        Intake/Output Summary (Last 24 hours) at 12/17/2021 1259 Last data filed at 12/17/2021 1058 Gross per 24 hour  Intake 3097.03 ml  Output 400 ml  Net 2697.03 ml   Filed Weights   12/15/21 1857 12/17/21 0900  Weight: 56 kg 52.8 kg    Examination:  General exam: thin, chronically ill appearing male,very anxious   Respiratory system: tachypneic, diminished BS LLL.   Cardiovascular system: normal S1 & S2 heard. No JVD, murmurs, rubs, gallops or clicks. No pedal edema. Gastrointestinal system: Abdomen is nondistended, soft and nontender. No organomegaly or masses felt. Normal bowel sounds heard. Central nervous system: Alert and oriented. No focal neurological deficits. Extremities: Symmetric 5 x 5 power. Skin: No rashes, lesions or ulcers Psychiatry: Judgement and insight appear poor. Mood & affect appropriate.   Data Reviewed: I have personally reviewed following labs and imaging studies  CBC: Recent Labs  Lab 12/15/21 1607 12/16/21 0343 12/17/21 0500  WBC 16.1* 15.1* 19.4*  NEUTROABS 11.8* 14.4* 17.1*  HGB 10.4* 9.1* 10.1*  HCT 29.9* 26.0* 28.1*  MCV 103.8* 104.8* 102.9*  PLT 135* 128* 0000000    Basic Metabolic Panel: Recent Labs  Lab 12/15/21 1607 12/16/21 0343 12/17/21 0500  NA 131* 135 139  K 3.6 3.5 3.5  CL 98 107 111  CO2 24 21* 21*  GLUCOSE 110* 138* 99  BUN 15 14 10   CREATININE 1.07 0.85 0.76  CALCIUM 7.4* 7.4* 7.5*  MG  --  1.6* 1.5*    GFR: Estimated Creatinine Clearance: 66.9 mL/min (by C-G formula based on SCr of 0.76 mg/dL).  Liver Function Tests: Recent Labs  Lab 12/15/21 1607 12/16/21 0343 12/17/21 0500  AST 36 32 30  ALT 29 23 24   ALKPHOS 98 72 74  BILITOT 1.5* 1.4* 1.1  PROT 5.0* 5.1* 4.8*  ALBUMIN 1.5* 2.5* 2.1*    CBG: No results for input(s): GLUCAP in the last 168 hours.  Recent Results (from the past 240 hour(s))  Resp Panel by RT-PCR (Flu A&B, Covid) Nasopharyngeal Swab     Status: None   Collection Time: 12/15/21  5:05 PM   Specimen: Nasopharyngeal Swab; Nasopharyngeal(NP) swabs in vial transport medium  Result Value Ref Range Status   SARS Coronavirus 2 by RT PCR NEGATIVE NEGATIVE Final    Comment: (NOTE) SARS-CoV-2 target nucleic acids are NOT DETECTED.  The SARS-CoV-2 RNA is generally detectable in upper respiratory specimens during the acute phase of infection. The  lowest concentration of SARS-CoV-2 viral copies this assay can detect is 138 copies/mL. A negative result does not preclude SARS-Cov-2 infection and should not be used as the sole basis for treatment or other patient management decisions. A negative result may occur with  improper specimen collection/handling, submission of specimen other than nasopharyngeal swab, presence of viral mutation(s) within the areas targeted by this assay, and inadequate number of viral copies(<138 copies/mL). A negative result must be combined with clinical observations, patient history, and epidemiological information. The expected result is Negative.  Fact Sheet for Patients:  EntrepreneurPulse.com.au  Fact Sheet for Healthcare Providers:  IncredibleEmployment.be  This test is no t yet approved or cleared by the [place] FDA and  has  been authorized for detection and/or diagnosis of SARS-CoV-2 by FDA under an Emergency Use Authorization (EUA). This EUA will remain  in effect (meaning this test can be used) for the duration of the COVID-19 declaration under Section 564(b)(1) of the Act, 21 U.S.C.section 360bbb-3(b)(1), unless the authorization is terminated  or revoked sooner.       Influenza A by PCR NEGATIVE NEGATIVE Final   Influenza B by PCR NEGATIVE NEGATIVE Final    Comment: (NOTE) The Xpert Xpress SARS-CoV-2/FLU/RSV plus assay is intended as an aid in the diagnosis of influenza from Nasopharyngeal swab specimens and should not be used as a sole basis for treatment. Nasal washings and aspirates are unacceptable for Xpert Xpress SARS-CoV-2/FLU/RSV testing.  Fact Sheet for Patients: EntrepreneurPulse.com.au  Fact Sheet for Healthcare Providers: IncredibleEmployment.be  This test is not yet approved or cleared by the [place] FDA and has been authorized for detection and/or diagnosis of SARS-CoV-2 by FDA under  an Emergency Use Authorization (EUA). This EUA will remain in effect (meaning this test can be used) for the duration of the COVID-19 declaration under Section 564(b)(1) of the Act, 21 U.S.C. section 360bbb-3(b)(1), unless the authorization is terminated or revoked.  Performed at [facility], [address] [place], [place] [address]   Blood culture (routine x 2)     Status: None (Preliminary result)   Collection Time: 12/15/21  7:19 PM   Specimen: BLOOD LEFT HAND  Result Value Ref Range Status   Specimen Description BLOOD LEFT HAND  Final   Special Requests   Final    BOTTLES DRAWN AEROBIC AND ANAEROBIC Blood Culture results may not be optimal due to an inadequate volume of blood received in culture bottles   Culture   Final    NO GROWTH 2 DAYS Performed at [facility], [address] [place], [place] [address]    Report Status PENDING  Incomplete  Blood culture (routine x 2)     Status: None (Preliminary result)   Collection Time: 12/15/21  7:19 PM   Specimen: BLOOD RIGHT HAND  Result Value Ref Range Status   Specimen Description BLOOD RIGHT HAND  Final   Special Requests   Final    BOTTLES DRAWN AEROBIC ONLY Blood Culture results may not be optimal due to an inadequate volume of blood received in culture bottles   Culture   Final    NO GROWTH 2 DAYS Performed at [facility], [address] [place], [place] [address]    Report Status PENDING  Incomplete  MRSA Next Gen by PCR, Nasal     Status: None   Collection Time: 12/17/21  9:07 AM   Specimen: Nasal Mucosa; Nasal Swab  Result Value Ref Range Status   MRSA by PCR Next Gen NOT DETECTED NOT DETECTED Final    Comment: (NOTE) The GeneXpert MRSA Assay (FDA approved for NASAL specimens only), is one component of a comprehensive MRSA colonization surveillance program. It is not intended to diagnose MRSA infection nor to guide or monitor treatment for MRSA infections. Test performance is not FDA approved in patients less than  13 years old. Performed at [facility], [address], [place],  [address]      Radiology Studies: CT Angio Chest PE W and/or Wo Contrast  Result Date: 12/15/2021 CLINICAL DATA:  Pulmonary embolism (PE) suspected, positive D-dimer; Abdominal pain, acute, nonlocalized EXAM: CT ANGIOGRAPHY CHEST CT ABDOMEN AND PELVIS WITH CONTRAST TECHNIQUE: Multidetector CT imaging of the chest was performed using the standard  protocol during bolus administration of intravenous contrast. Multiplanar CT image reconstructions and MIPs were obtained to evaluate the vascular anatomy. Multidetector CT imaging of the abdomen and pelvis was performed using the standard protocol during bolus administration of intravenous contrast. CONTRAST:  55mL OMNIPAQUE IOHEXOL 350 MG/ML SOLN COMPARISON:  None. FINDINGS: CTA CHEST FINDINGS Cardiovascular: Satisfactory opacification of the pulmonary arteries to the segmental level. No evidence of pulmonary embolism. Normal heart size. No significant pericardial effusion. The thoracic aorta is normal in caliber. Mild-to-moderate atherosclerotic plaque of the thoracic aorta. Aortic valve leaflet calcifications. Three vessel coronary artery calcifications. Mediastinum/Nodes: No enlarged mediastinal, hilar, or axillary lymph nodes. Thyroid gland, trachea, and esophagus demonstrate no significant findings. Trace debris within the trachea. Lungs/Pleura: Mild interlobular septal wall thickening most prominent within bilateral upper lobes. Diffuse at least moderate centrilobular emphysematous changes. No focal consolidation. Few scattered pulmonary micronodules. No pulmonary mass. No pleural effusion. No pneumothorax. Musculoskeletal: No chest wall abnormality. No suspicious lytic or blastic osseous lesions. No acute displaced fracture. Multilevel degenerative changes of the spine. Review of the MIP images confirms the above findings. CT ABDOMEN and PELVIS FINDINGS Hepatobiliary: No focal liver  abnormality. No gallstones, gallbladder wall thickening, or pericholecystic fluid. No biliary dilatation. Pancreas: No focal lesion. Normal pancreatic contour. No surrounding inflammatory changes. No main pancreatic ductal dilatation. Spleen: Normal in size without focal abnormality. Adrenals/Urinary Tract: No adrenal nodule bilaterally. Bilateral kidneys enhance symmetrically. No hydronephrosis. No hydroureter. The urinary bladder is unremarkable. Stomach/Bowel: Stomach is within normal limits. No evidence of small bowel wall thickening or dilatation. Diffuse bowel wall thickening of the colon with associated mucosal hyperemia and trace pericolonic fat stranding. Fluid density noted within the lumen of the large bowel. Appendix appears normal. Vascular/Lymphatic: No abdominal aorta or iliac aneurysm. Severe atherosclerotic plaque of the aorta and its branches. No abdominal, pelvic, or inguinal lymphadenopathy. Reproductive: Prostate is unremarkable. Other: Trace volume free fluid within the pelvis. No intraperitoneal free gas. No organized fluid collection. Musculoskeletal: No abdominal wall hernia or abnormality. No suspicious lytic or blastic osseous lesions. No acute displaced fracture. Multilevel degenerative changes of the spine. Review of the MIP images confirms the above findings. IMPRESSION: 1. No pulmonary embolus. 2. Mild pulmonary edema. 3. Trace right pleural effusion. 4. Pancolitis. 5. Aortic Atherosclerosis (ICD10-I70.0) and Emphysema (ICD10-J43.9). 6. Few scattered pulmonary micronodules. No follow-up needed if patient is low-risk (and has no known or suspected primary neoplasm). Non-contrast chest CT can be considered in 12 months if patient is high-risk. This recommendation follows the consensus statement: Guidelines for Management of Incidental Pulmonary Nodules Detected on CT Images: From the Fleischner Society 2017; Radiology 2017; 284:228-243. Electronically Signed   By: Iven Finn M.D.    On: 12/15/2021 18:52   CT ABDOMEN PELVIS W CONTRAST  Result Date: 12/15/2021 CLINICAL DATA:  Pulmonary embolism (PE) suspected, positive D-dimer; Abdominal pain, acute, nonlocalized EXAM: CT ANGIOGRAPHY CHEST CT ABDOMEN AND PELVIS WITH CONTRAST TECHNIQUE: Multidetector CT imaging of the chest was performed using the standard protocol during bolus administration of intravenous contrast. Multiplanar CT image reconstructions and MIPs were obtained to evaluate the vascular anatomy. Multidetector CT imaging of the abdomen and pelvis was performed using the standard protocol during bolus administration of intravenous contrast. CONTRAST:  40mL OMNIPAQUE IOHEXOL 350 MG/ML SOLN COMPARISON:  None. FINDINGS: CTA CHEST FINDINGS Cardiovascular: Satisfactory opacification of the pulmonary arteries to the segmental level. No evidence of pulmonary embolism. Normal heart size. No significant pericardial effusion. The thoracic aorta is normal in caliber. Mild-to-moderate  atherosclerotic plaque of the thoracic aorta. Aortic valve leaflet calcifications. Three vessel coronary artery calcifications. Mediastinum/Nodes: No enlarged mediastinal, hilar, or axillary lymph nodes. Thyroid gland, trachea, and esophagus demonstrate no significant findings. Trace debris within the trachea. Lungs/Pleura: Mild interlobular septal wall thickening most prominent within bilateral upper lobes. Diffuse at least moderate centrilobular emphysematous changes. No focal consolidation. Few scattered pulmonary micronodules. No pulmonary mass. No pleural effusion. No pneumothorax. Musculoskeletal: No chest wall abnormality. No suspicious lytic or blastic osseous lesions. No acute displaced fracture. Multilevel degenerative changes of the spine. Review of the MIP images confirms the above findings. CT ABDOMEN and PELVIS FINDINGS Hepatobiliary: No focal liver abnormality. No gallstones, gallbladder wall thickening, or pericholecystic fluid. No biliary  dilatation. Pancreas: No focal lesion. Normal pancreatic contour. No surrounding inflammatory changes. No main pancreatic ductal dilatation. Spleen: Normal in size without focal abnormality. Adrenals/Urinary Tract: No adrenal nodule bilaterally. Bilateral kidneys enhance symmetrically. No hydronephrosis. No hydroureter. The urinary bladder is unremarkable. Stomach/Bowel: Stomach is within normal limits. No evidence of small bowel wall thickening or dilatation. Diffuse bowel wall thickening of the colon with associated mucosal hyperemia and trace pericolonic fat stranding. Fluid density noted within the lumen of the large bowel. Appendix appears normal. Vascular/Lymphatic: No abdominal aorta or iliac aneurysm. Severe atherosclerotic plaque of the aorta and its branches. No abdominal, pelvic, or inguinal lymphadenopathy. Reproductive: Prostate is unremarkable. Other: Trace volume free fluid within the pelvis. No intraperitoneal free gas. No organized fluid collection. Musculoskeletal: No abdominal wall hernia or abnormality. No suspicious lytic or blastic osseous lesions. No acute displaced fracture. Multilevel degenerative changes of the spine. Review of the MIP images confirms the above findings. IMPRESSION: 1. No pulmonary embolus. 2. Mild pulmonary edema. 3. Trace right pleural effusion. 4. Pancolitis. 5. Aortic Atherosclerosis (ICD10-I70.0) and Emphysema (ICD10-J43.9). 6. Few scattered pulmonary micronodules. No follow-up needed if patient is low-risk (and has no known or suspected primary neoplasm). Non-contrast chest CT can be considered in 12 months if patient is high-risk. This recommendation follows the consensus statement: Guidelines for Management of Incidental Pulmonary Nodules Detected on CT Images: From the Fleischner Society 2017; Radiology 2017; 284:228-243. Electronically Signed   By: Iven Finn M.D.   On: 12/15/2021 18:52   US Venous Img Lower Unilateral Left (DVT)  Result Date:  12/16/2021 CLINICAL DATA:  Pain and swelling, positive D-dimer EXAM: Left LOWER EXTREMITY VENOUS DOPPLER ULTRASOUND TECHNIQUE: Gray-scale sonography with compression, as well as color and duplex ultrasound, were performed to evaluate the deep venous system(s) from the level of the common femoral vein through the popliteal and proximal calf veins. COMPARISON:  None. FINDINGS: VENOUS Findings suggest acute or chronic DVT in the left peroneal and posterior tibial veins. There is no evidence of acute DVT in the left common femoral, femoral and popliteal veins. Limited views of the contralateral common femoral vein are unremarkable. OTHER None. Limitations: none IMPRESSION: Findings suggest acute or chronic DVT in the left peroneal and posterior tibial veins. Electronically Signed   By: Elmer Picker M.D.   On: 12/16/2021 09:29   DG CHEST PORT 1 VIEW  Result Date: 12/17/2021 CLINICAL DATA:  Hypoxia EXAM: PORTABLE CHEST 1 VIEW COMPARISON:  Chest x-ray earlier the same day, CT chest 12/15/2021 FINDINGS: Heart size is normal. Mediastinum is stable. Hyperinflated lungs with chronic interstitial emphysematous changes. Hazy opacity in the right lower lung zone consistent with small layering pleural effusion. Mild opacities at the left lung base which may represent subsegmental atelectasis or early infiltrate. No pneumothorax  visualized. IMPRESSION: 1. Mild opacities at the left lung base which may represent subsegmental atelectasis or early infiltrate. 2. Small layering right pleural effusion. 3. Emphysema. Electronically Signed   By: Ofilia Neas M.D.   On: 12/17/2021 10:27   DG CHEST PORT 1 VIEW  Result Date: 12/17/2021 CLINICAL DATA:  66 year old male with sepsis. EXAM: PORTABLE CHEST 1 VIEW COMPARISON:  CTA chest 12/15/2021 and earlier. FINDINGS: Portable AP upright view at 0503 hours. Increased veiling opacity in the right lower lung from the recent CTA. Underlying large lung volumes and emphysema  redemonstrated. Normal cardiac size and mediastinal contours. No pneumothorax. No acute osseous abnormality identified. Negative visible bowel gas. IMPRESSION: 1. Evidence of increasing right pleural effusion since recent CTA, although likely still small volume. 2.  Emphysema (ICD10-J43.9). Electronically Signed   By: Genevie Ann M.D.   On: 12/17/2021 05:19   ECHOCARDIOGRAM COMPLETE  Result Date: 12/16/2021    ECHOCARDIOGRAM REPORT   Patient Name:   HARVEER EDGERSON [facility] Date of Exam: 12/16/2021 Medical Rec #:  [id number]         Height:       66.0 in Accession #:    [id number]        Weight:       123.5 lb Date of Birth:  1956-06-18         BSA:          1.629 m Patient Age:    40 years          BP:           79/57 mmHg Patient Gender: M                 HR:           74 bpm. Exam Location:  [facility] Procedure: 2D Echo, Cardiac Doppler and Color Doppler Indications:    Pulmonary Edema  History:        Patient has prior history of Echocardiogram examinations, most                 recent 02/05/2019. CAD, COPD; Signs/Symptoms:Chest Pain and                 Shortness of Breath. Tobacco and ETOH abuse.  Sonographer:    Wenda Low Referring Phys: [id number] ASIA B [place]  Sonographer Comments: Technically difficult study due to poor echo windows and suboptimal apical window. Image acquisition challenging due to patient body habitus. IMPRESSIONS  1. Left ventricular ejection fraction, by estimation, is 60 to 65%. The left ventricle has normal function. The left ventricle has no regional wall motion abnormalities. Left ventricular diastolic parameters are consistent with Grade III diastolic dysfunction (restrictive). Elevated left atrial pressure.  2. Right ventricular systolic function is normal. The right ventricular size is normal. Tricuspid regurgitation signal is inadequate for assessing PA pressure.  3. The mitral valve is normal in structure. No evidence of mitral valve regurgitation. No evidence of mitral  stenosis.  4. The aortic valve was not well visualized. There is moderate calcification of the aortic valve. There is moderate thickening of the aortic valve. Aortic valve regurgitation is not visualized. No aortic stenosis is present.  5. The inferior vena cava is normal in size with greater than 50% respiratory variability, suggesting right atrial pressure of 3 mmHg. FINDINGS  Left Ventricle: Left ventricular ejection fraction, by estimation, is 60 to 65%. The left ventricle has normal function. The left ventricle has no regional wall motion abnormalities. The  left ventricular internal cavity size was normal in size. There is  no left ventricular hypertrophy. Left ventricular diastolic parameters are consistent with Grade III diastolic dysfunction (restrictive). Elevated left atrial pressure. Right Ventricle: The right ventricular size is normal. No increase in right ventricular wall thickness. Right ventricular systolic function is normal. Tricuspid regurgitation signal is inadequate for assessing PA pressure. Left Atrium: Left atrial size was not well visualized. Right Atrium: Right atrial size was not well visualized. Pericardium: There is no evidence of pericardial effusion. Mitral Valve: The mitral valve is normal in structure. No evidence of mitral valve regurgitation. No evidence of mitral valve stenosis. MV peak gradient, 5.5 mmHg. The mean mitral valve gradient is 1.0 mmHg. Tricuspid Valve: The tricuspid valve is not well visualized. Tricuspid valve regurgitation is mild . No evidence of tricuspid stenosis. Aortic Valve: The aortic valve was not well visualized. There is moderate calcification of the aortic valve. There is moderate thickening of the aortic valve. There is moderate aortic valve annular calcification. Aortic valve regurgitation is not visualized. No aortic stenosis is present. Aortic valve mean gradient measures 1.0 mmHg. Aortic valve peak gradient measures 2.4 mmHg. Aortic valve area, by  VTI measures 2.66 cm. Pulmonic Valve: The pulmonic valve was not well visualized. Pulmonic valve regurgitation is not visualized. No evidence of pulmonic stenosis. Aorta: The aortic root is normal in size and structure. Venous: The inferior vena cava is normal in size with greater than 50% respiratory variability, suggesting right atrial pressure of 3 mmHg. IAS/Shunts: No atrial level shunt detected by color flow Doppler.  LEFT VENTRICLE PLAX 2D LVIDd:         3.30 cm   Diastology LVIDs:         2.30 cm   LV e' medial:    6.74 cm/s LV PW:         0.80 cm   LV E/e' medial:  17.4 LV IVS:        0.80 cm   LV e' lateral:   8.05 cm/s LVOT diam:     2.00 cm   LV E/e' lateral: 14.5 LV SV:         48 LV SV Index:   30 LVOT Area:     3.14 cm  LEFT ATRIUM           Index LA diam:      3.30 cm 2.03 cm/m LA Vol (A4C): 38.5 ml 23.63 ml/m  AORTIC VALVE AV Area (Vmax):    2.18 cm AV Area (Vmean):   2.70 cm AV Area (VTI):     2.66 cm AV Vmax:           78.20 cm/s AV Vmean:          41.000 cm/s AV VTI:            0.181 m AV Peak Grad:      2.4 mmHg AV Mean Grad:      1.0 mmHg LVOT Vmax:         54.20 cm/s LVOT Vmean:        35.200 cm/s LVOT VTI:          0.153 m LVOT/AV VTI ratio: 0.85  AORTA Ao Root diam: 3.30 cm Ao Asc diam:  3.80 cm MITRAL VALVE MV Area (PHT): 7.16 cm     SHUNTS MV Area VTI:   2.10 cm     Systemic VTI:  0.15 m MV Peak grad:  5.5 mmHg  Systemic Diam: 2.00 cm MV Mean grad:  1.0 mmHg MV Vmax:       1.17 m/s MV Vmean:      36.9 cm/s MV Decel Time: 106 msec MV E velocity: 117.00 cm/s MV A velocity: 33.00 cm/s MV E/A ratio:  3.55 Carlyle Dolly MD Electronically signed by Carlyle Dolly MD Signature Date/Time: 12/16/2021/1:07:47 PM    Final     Scheduled Meds:  Chlorhexidine Gluconate Cloth  6 each Topical Daily   feeding supplement  237 mL Oral BID BM   ferrous sulfate  325 mg Oral Q breakfast   folic acid  1 mg Oral Daily   levofloxacin  750 mg Oral Daily   midodrine  10 mg Oral TID WC    mirtazapine  15 mg Oral QHS   multivitamin with minerals  1 tablet Oral Daily   nicotine  21 mg Transdermal Daily   [START ON 12/18/2021] pneumococcal 23 valent vaccine  0.5 mL Intramuscular Tomorrow-1000   QUEtiapine  25 mg Oral QHS   thiamine  100 mg Oral Daily   Or   thiamine  100 mg Intravenous Daily   umeclidinium bromide  1 puff Inhalation Daily   Continuous Infusions:  sodium chloride 65 mL/hr at 12/17/21 1042   sodium chloride     heparin 1,100 Units/hr (12/17/21 0618)   metronidazole 500 mg (12/17/21 1216)     LOS: 2 days   Critical Care Procedure Note Authorized and Performed by: Murvin Natal MD  Total Critical Care time:  50 mins Due to a high probability of clinically significant, life threatening deterioration, the patient required my highest level of preparedness to intervene emergently and I personally spent this critical care time directly and personally managing the patient.  This critical care time included obtaining a history; examining the patient, pulse oximetry; ordering and review of studies; arranging urgent treatment with development of a management plan; evaluation of patient's response of treatment; frequent reassessment; and discussions with other providers.  This critical care time was performed to assess and manage the high probability of imminent and life threatening deterioration that could result in multi-organ failure.  It was exclusive of separately billable procedures and treating other patients and teaching time.    Irwin Brakeman, MD How to contact the [facility] Attending or Consulting provider [place] or covering provider during after hours [place], for this patient?  Check the care team in [facility] and look for a) attending/consulting [facility] provider listed and b) the [facility] team listed Log into [email or web address] and use [place]'s universal password to access. If you do not have the password, please contact the hospital operator. Locate the [facility] provider you are  looking for under [facility] and page to a number that you can be directly reached. If you still have difficulty reaching the provider, please page the [facility] (Director on Call) for the Hospitalists listed on amion for assistance.  12/17/2021, 12:59 PM

## 2021-12-17 NOTE — Progress Notes (Signed)
Patient had episode of aspiration while taking morning medications. Oxygen sats went down into the 20s. HR up to 150s. Patient able to verbalize pills seem stuck in throat. Complains of chest hurting. Patient suctioned, able to produce some sputum with small cough. Tolerating NRB with sats in higher 90s. Dr. Wynetta Emery notified.  Respiratory notified and at bedside.  Currently, getting EKG on patient.

## 2021-12-17 NOTE — Plan of Care (Signed)
°  Problem: Acute Rehab PT Goals(only PT should resolve) Goal: Pt Will Go Supine/Side To Sit Outcome: Progressing Flowsheets (Taken 12/17/2021 1601) Pt will go Supine/Side to Sit: with min guard assist Goal: Patient Will Transfer Sit To/From Stand Outcome: Progressing Flowsheets (Taken 12/17/2021 1601) Patient will transfer sit to/from stand: with min guard assist Goal: Pt Will Transfer Bed To Chair/Chair To Bed Outcome: Progressing Flowsheets (Taken 12/17/2021 1601) Pt will Transfer Bed to Chair/Chair to Bed: min guard assist Goal: Pt Will Ambulate Outcome: Progressing Flowsheets (Taken 12/17/2021 1601) Pt will Ambulate:  25 feet  with minimal assist  with rolling walker   4:01 PM, 12/17/21 Lonell Grandchild, MPT Physical Therapist with [facility] [phone] office [phone] mobile phone

## 2021-12-17 NOTE — Evaluation (Signed)
Physical Therapy Evaluation Patient Details Name: Spencer Hickman MRN: [id number] DOB: 1956-11-11 Today's Date: 12/17/2021  History of Present Illness  Spencer Hickman  is a 66 y.o. male, with history of alcohol abuse, COPD, tobacco use disorder, coronary artery disease, hypertension and more presents ED with chief complaint of diarrhea.  Patient reports he had diarrhea intermittently for 2 to 3 weeks.  This current episode has been lasting for 3 to 5 days.  He reports some bowel incontinence.  He has associated dizziness upon standing, weakness, fatigue.  He reports his episodes of diarrhea are 2+ times per day.  They are nonbloody.  His stool is black but it has been ever since he started taking iron.  He denies any tarry stools.  He reports the stools are malodorous.  He has had no fever, no dysuria, no cough, no rashes.  He reports that today he came in because home health took his blood pressures that he was hypotensive, he had had some bowel incontinence, so they sent him into the ED.  Patient reports he did take his medications today including Cardizem.  He has not had any pain.  He has not had any emesis.  He reports that he has felt fine.  No recent antibiotic use.  No recent travel.  Patient reports that he is barely been out of his home for the last 3 years secondary to pandemic.  He has no history of C. difficile.  Patient does report a decrease in p.o. intake secondary to decrease in appetite.  He reports significant weight loss, possibly 60 pounds.  He was drinking a 12 pack of beer per day, but due to this decreased appetite he has only been drinking 2-4 beers per day.  He has barely any food intake.  Patient has no other complaints at this time.   Clinical Impression  Patient demonstrates slow labored movement for sitting up at bedside with c/o left shoulder blade soreness, required repeated attempts before able to complete sit to stand due to BLE weakness and limited to a few slow labored  side steps at bedside before having to sit due to c/o fatigue.  Patient tolerated sitting up in chair after therapy - RN aware.  Patient will benefit from continued skilled physical therapy in hospital and recommended venue below to increase strength, balance, endurance for safe ADLs and gait.          Recommendations for follow up therapy are one component of a multi-disciplinary discharge planning process, led by the attending physician.  Recommendations may be updated based on patient status, additional functional criteria and insurance authorization.  Follow Up Recommendations Home health PT    Assistance Recommended at Discharge Intermittent Supervision/Assistance  Patient can return home with the following  A lot of help with walking and/or transfers;A little help with bathing/dressing/bathroom;Assistance with cooking/housework    Equipment Recommendations None recommended by PT  Recommendations for Other Services       Functional Status Assessment Patient has had a recent decline in their functional status and demonstrates the ability to make significant improvements in function in a reasonable and predictable amount of time.     Precautions / Restrictions Precautions Precautions: Fall Restrictions Weight Bearing Restrictions: No      Mobility  Bed Mobility Overal bed mobility: Needs Assistance Bed Mobility: Supine to Sit     Supine to sit: Min assist     General bed mobility comments: increased time, labored movement    Transfers Overall transfer  level: Needs assistance Equipment used: Rolling walker (2 wheels) Transfers: Sit to/from Stand;Bed to chair/wheelchair/BSC Sit to Stand: Mod assist   Step pivot transfers: Min assist;Mod assist       General transfer comment: slow labored movement, has difficulty completing sit to stands due to BLE weakness    Ambulation/Gait Ambulation/Gait assistance: Min assist;Mod assist Gait Distance (Feet): 5  Feet Assistive device: Rolling walker (2 wheels) Gait Pattern/deviations: Decreased step length - right;Decreased step length - left;Decreased stride length Gait velocity: decreased     General Gait Details: limited to a few slow unsteady labored side steps due to c/o generalized weakness  Stairs            Wheelchair Mobility    Modified Rankin (Stroke Patients Only)       Balance Overall balance assessment: Needs assistance Sitting-balance support: Feet supported;No upper extremity supported Sitting balance-Leahy Scale: Fair Sitting balance - Comments: fair/good seated at EOB   Standing balance support: Reliant on assistive device for balance;During functional activity;Bilateral upper extremity supported Standing balance-Leahy Scale: Poor Standing balance comment: fair/poor using RW                             Pertinent Vitals/Pain Pain Assessment: Faces Faces Pain Scale: Hurts little more Pain Location: left shoulder blade Pain Descriptors / Indicators: Sore Pain Intervention(s): Limited activity within patient's tolerance;Monitored during session;Repositioned    Home Living Family/patient expects to be discharged to:: Private residence Living Arrangements: Alone Available Help at Discharge: Family;Available 24 hours/day Type of Home: House Home Access: Ramped entrance       Home Layout: One level Home Equipment: Conservation officer, nature (2 wheels);Cane - single point;Grab bars - tub/shower      Prior Function Prior Level of Function : Needs assist         Mobility (physical): Bed mobility;Transfers;Gait;Stairs   Mobility Comments: household ambulator using RW PRN, does not drive ADLs Comments: family/friends assist with community ADLs     Hand Dominance        Extremity/Trunk Assessment   Upper Extremity Assessment Upper Extremity Assessment: Generalized weakness    Lower Extremity Assessment Lower Extremity Assessment: Generalized  weakness    Cervical / Trunk Assessment Cervical / Trunk Assessment: Normal  Communication   Communication: No difficulties  Cognition Arousal/Alertness: Awake/alert Behavior During Therapy: WFL for tasks assessed/performed Overall Cognitive Status: Within Functional Limits for tasks assessed                                   Functional Status Assessment: Patient has had a recent decline in their functional status and demonstrates the ability to make significant improvements in function in a reasonable and predictable amount of time.      General Comments      Exercises     Assessment/Plan    PT Assessment Patient needs continued PT services  PT Problem List Decreased strength;Decreased activity tolerance;Decreased balance;Decreased mobility       PT Treatment Interventions DME instruction;Gait training;Stair training;Functional mobility training;Therapeutic activities;Therapeutic exercise;Patient/family education;Balance training    PT Goals (Current goals can be found in the Care Plan section)  Acute Rehab PT Goals Patient Stated Goal: return home with family and friends to assist PT Goal Formulation: With patient Time For Goal Achievement: 12/24/21 Potential to Achieve Goals: Good    Frequency Min 3X/week     Co-evaluation  AM-PAC PT "6 Clicks" Mobility  Outcome Measure Help needed turning from your back to your side while in a flat bed without using bedrails?: A Little Help needed moving from lying on your back to sitting on the side of a flat bed without using bedrails?: A Lot Help needed moving to and from a bed to a chair (including a wheelchair)?: A Lot Help needed standing up from a chair using your arms (e.g., wheelchair or bedside chair)?: A Lot Help needed to walk in hospital room?: A Lot Help needed climbing 3-5 steps with a railing? : A Lot 6 Click Score: 13    End of Session   Activity Tolerance: Patient tolerated  treatment well;Patient limited by fatigue Patient left: in chair;with call bell/phone within reach Nurse Communication: Mobility status PT Visit Diagnosis: Unsteadiness on feet (R26.81);Other abnormalities of gait and mobility (R26.89);Muscle weakness (generalized) (M62.81)    Time: [id number] PT Time Calculation (min) (ACUTE ONLY): 30 min   Charges:   PT Evaluation $PT Eval Moderate Complexity: 1 Mod PT Treatments $Therapeutic Activity: 23-37 mins        3:59 PM, 12/17/21 Lonell Grandchild, MPT Physical Therapist with [facility] [phone] office [phone] mobile phone

## 2021-12-17 NOTE — Progress Notes (Signed)
Stat Chest xray ordered. EKG obtained. NRB taken off. Sats maintaining on Room Air. HR still 150s. Dr. Wynetta Emery and Respiratory remain at bedside.

## 2021-12-17 NOTE — ED Notes (Signed)
ICU nurse reports she has already looked at the chart and she does not have any questions at this time.

## 2021-12-17 NOTE — Progress Notes (Signed)
ANTICOAGULATION CONSULT NOTE - Follow Up Consult  Pharmacy Consult for heparin Indication: DVT  Labs: Recent Labs    12/15/21 1607 12/15/21 1817 12/16/21 0343 12/17/21 0500 12/17/21 1306 12/17/21 2201  HGB 10.4*  --  9.1* 10.1*  --   --   HCT 29.9*  --  26.0* 28.1*  --   --   PLT 135*  --  128* 153  --   --   LABPROT  --   --  18.5*  --   --   --   INR  --   --  1.5*  --   --   --   HEPARINUNFRC  --   --   --  0.15* 0.30 0.50  CREATININE 1.07  --  0.85 0.76  --   --   CKTOTAL 293  --   --   --   --   --   TROPONINIHS 7 6  --   --   --   --      Assessment/Plan: 66yo male therapeutic on heparin after rate change. Will continue infusion at current rate of 1200 units/hr and confirm stable with am labs.   Wynona Neat, PharmD, BCPS  12/17/2021,11:42 PM
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
## 2021-12-17 NOTE — Progress Notes (Signed)
ANTICOAGULATION CONSULT NOTE - Follow Up Consult  Pharmacy Consult for heparin Indication: DVT  Labs: Recent Labs    12/15/21 1607 12/15/21 1817 12/16/21 0343 12/17/21 0500  HGB 10.4*  --  9.1* 10.1*  HCT 29.9*  --  26.0* 28.1*  PLT 135*  --  128* 153  LABPROT  --   --  18.5*  --   INR  --   --  1.5*  --   HEPARINUNFRC  --   --   --  0.15*  CREATININE 1.07  --  0.85 0.76  CKTOTAL 293  --   --   --   TROPONINIHS 7 6  --   --     Assessment: 65yo male subtherapeutic on heparin with initial dosing for DVT; no infusion issues or signs of bleeding per RN.  Goal of Therapy:  Heparin level 0.3-0.7 units/ml   Plan:  Will give small heparin bolus of 1000 units and increase heparin infusion by 3 units/kg/hr to 1100 units/hr and check level in 6 hours.    Wynona Neat, PharmD, BCPS  12/17/2021,6:04 AM

## 2021-12-17 NOTE — Progress Notes (Signed)
Dr. Johnson at bedside.

## 2021-12-17 NOTE — Progress Notes (Signed)
Patient started desatting on Room Air. Nasal Cannula tried at 5L, but sats not maintaining. Patient instructed to try some more water. Replaced on NRB. Instructed to try some diet coke. Cxray at bedside. Dr. Wynetta Emery calling Dr. Halford Chessman with pulmonology.

## 2021-12-17 NOTE — Progress Notes (Signed)
ANTICOAGULATION CONSULT NOTE -  Pharmacy Consult for heparin Indication: DVT  No Known Allergies  Patient Measurements: Height: 5\' 11"  (180.3 cm) Weight: 52.8 kg (116 lb 6.5 oz) IBW/kg (Calculated) : 75.3 Heparin Dosing Weight: 56 kg  Vital Signs: Temp: 96.2 F (35.7 C) (01/05 1100) Temp Source: Axillary (01/05 1100) BP: 94/66 (01/05 1400) Pulse Rate: 89 (01/05 1400)  Labs: Recent Labs    12/15/21 1607 12/15/21 1817 12/16/21 0343 12/17/21 0500 12/17/21 1306  HGB 10.4*  --  9.1* 10.1*  --   HCT 29.9*  --  26.0* 28.1*  --   PLT 135*  --  128* 153  --   LABPROT  --   --  18.5*  --   --   INR  --   --  1.5*  --   --   HEPARINUNFRC  --   --   --  0.15* 0.30  CREATININE 1.07  --  0.85 0.76  --   CKTOTAL 293  --   --   --   --   TROPONINIHS 7 6  --   --   --      Estimated Creatinine Clearance: 66.9 mL/min (by C-G formula based on SCr of 0.76 mg/dL).   Medical History: Past Medical History:  Diagnosis Date   Alcohol abuse    COPD (chronic obstructive pulmonary disease) ([place])    Coronary artery disease    States "had a mild heart attack years ago.   Tobacco abuse 02/05/2019    Assessment:   66 y.o. male, with history of alcohol abuse, COPD, tobacco use disorder, coronary artery disease, hypertension. 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 Pharmacy consulted to initiate heparin. Not on anticoagulation prior to admission.   HL 0.30- therapeutic at lowest level Hgb 10.1  platelets WNL   Goal of Therapy:  Heparin level 0.3-0.7 units/ml Monitor platelets by anticoagulation protocol: Yes   Plan:  Heparin infusion at 1200 units/hr Check heparin level in 8 hours and daily while on heparin Continue to monitor H&H and platelets   Margot Ables, PharmD Clinical Pharmacist 12/17/2021 2:13 PM

## 2021-12-17 NOTE — Consult Note (Signed)
[facility]   Patient name: Spencer Hickman Admit date: 12/15/2021  DOB: [birth year] LOS: 2  MRN: [id number] Consult date: 12/17/2021  Referring provider: Dr. Wynetta Emery, [facility]: Aspirated a pill    History:  66 yo male smoker presented to [facility] with anorexia, diarrhea, dizziness, weakness, and fatigue.  Admitted with sepsis from pancolitis.  Tried swallowing several pills at once on 12/17/21.  Felt like he aspirated his iron pill.  After this he developed severe respiratory distress and hypoxia.  PCCM asked to assess in ICU.  Past medical history:  ETOH, COPD, CAD, HTN  Significant events:  1/03 Admit 1/05 possible aspiration of iron pill  Studies:  CT angio chest 12/15/21 >> atherosclerosis, moderate centrilobular emphysema, few micronodules, small Rt effusion CT abd/pelvis 12/15/21 >> diffuse bowel wall thickening of colon Echo 12/16/21 >> EF 60 to 65%, grade 3 DD Doppler legs 12/16/21 >> acute/chronic DVT in Lt peroneal and posterior tibial veins  Micro:  COVID/flu 1/03 >> negative Blood 1/03 >>   Lines:     Antibiotics:  Vancomycin 1/03 >> 1/04 Flagyl 1/03 >>  Cefepime 1/03 >> 1/04 Levaquin 1/05 >>   Consults:      Interim history:  Breathing better.  Doesn't feel congested or having wheeze.  Denies chest pain.  Vital signs:  BP 94/66    Pulse 89    Temp (!) 96.2 F (35.7 C) (Axillary)    Resp (!) 22    Ht 5\' 11"  (1.803 m)    Wt 52.8 kg    SpO2 100%    BMI 16.23 kg/m   Intake/output:  I/O last 3 completed shifts: In: 4603.6 [I.V.:1000; IV Piggyback:3603.6] Out: 825 [Urine:825]   Physical exam:   General - alert, thin Eyes - pupils reactive ENT - no sinus tenderness, no stridor Cardiac - regular rate/rhythm, no murmur Chest - decreased breath sounds b/l, no wheezing or rales Abdomen - soft, non tender, + bowel sounds Extremities - no cyanosis, clubbing, or edema Skin - no rashes Neuro - normal  strength, moves extremities, follows commands Psych - normal mood and behavior  Best practice:   DVT - heparin gtt SUP - Not indicated Nutrition - D3    Assessment/plan:   Acute respiratory distress after possibly aspirating iron pill. - not currently having any respiratory symptoms - f/u chest xray on 1/06  COPD with emphysema. - continue inhaler regimen  Tobacco abuse. - nicotine patch  Dysphagia. - f/u with speech therapy  Pan-colitis. - ABx per primary team  Hx of ETOH. - continue thiamine, folic acid  Cachexia. - advance diet as tolerated - might benefit from nutrition assessment  Anemia of chronic disease. - f/u CBC - transfuse for Hb < 7   Lt leg DVT. - heparin gtt  D/w Dr. Wynetta Emery  Resolved hospital problems:    Goals of care/Family discussions:  Code status: full code  Labs:   CMP Latest Ref Rng & Units 12/17/2021 12/16/2021 12/15/2021  Glucose 70 - 99 mg/dL 99 138(H) 110(H)  BUN 8 - 23 mg/dL 10 14 15   Creatinine 0.61 - 1.24 mg/dL 0.76 0.85 1.07  Sodium 135 - 145 mmol/L 139 135 131(L)  Potassium 3.5 - 5.1 mmol/L 3.5 3.5 3.6  Chloride 98 - 111 mmol/L 111 107 98  CO2 22 - 32 mmol/L 21(L) 21(L) 24  Calcium 8.9 - 10.3 mg/dL 7.5(L) 7.4(L) 7.4(L)  Total Protein 6.5 - 8.1 g/dL 4.8(L) 5.1(L) 5.0(L)  Total Bilirubin 0.3 -  1.2 mg/dL 1.1 1.4(H) 1.5(H)  Alkaline Phos 38 - 126 U/L 74 72 98  AST 15 - 41 U/L 30 32 36  ALT 0 - 44 U/L 24 23 29     CBC Latest Ref Rng & Units 12/17/2021 12/16/2021 12/15/2021  WBC 4.0 - 10.5 K/uL 19.4(H) 15.1(H) 16.1(H)  Hemoglobin 13.0 - 17.0 g/dL 10.1(L) 9.1(L) 10.4(L)  Hematocrit 39.0 - 52.0 % 28.1(L) 26.0(L) 29.9(L)  Platelets 150 - 400 K/uL 153 128(L) 135(L)    ABG No results found for: PHART, PCO2ART, PO2ART, HCO3, TCO2, ACIDBASEDEF, O2SAT  CBG (last 3)  No results for input(s): GLUCAP in the last 72 hours.   Past surgical history:  He  has a past surgical history that includes Abdominal surgery;  Esophagogastroduodenoscopy (egd) with propofol (N/A, 02/18/2020); Colonoscopy with propofol (N/A, 02/18/2020); and biopsy (02/18/2020).  Social history:  He  reports that he has been smoking cigarettes. He has a 50.00 pack-year smoking history. He has never used smokeless tobacco. He reports current alcohol use. He reports current drug use. Drug: Marijuana.   Review of systems:  Reviewed and negative  Family history:  His family history includes Brain cancer in his father; Cancer in his brother, mother, and sister; Stomach cancer in his mother.    Medications:   No current facility-administered medications on file prior to encounter.   Current Outpatient Medications on File Prior to Encounter  Medication Sig   albuterol (VENTOLIN HFA) 108 (90 Base) MCG/ACT inhaler Inhale 2 puffs into the lungs every 6 (six) hours as needed for shortness of breath.   dextromethorphan (DELSYM) 30 MG/5ML liquid Take 5 mLs (30 mg total) by mouth 2 (two) times daily as needed for cough.   diltiazem (CARDIZEM CD) 120 MG 24 hr capsule Take 120 mg by mouth daily.   ferrous sulfate 325 (65 FE) MG tablet Take 1 tablet (325 mg total) by mouth daily with breakfast.   folic acid (FOLVITE) 1 MG tablet Take 1 tablet (1 mg total) by mouth daily.   metoprolol succinate (TOPROL-XL) 50 MG 24 hr tablet Take 50 mg by mouth daily.   mirtazapine (REMERON) 15 MG tablet Take 15 mg by mouth at bedtime.   Multiple Vitamin (MULTIVITAMIN WITH MINERALS) TABS tablet Take 1 tablet by mouth daily.   omeprazole (PRILOSEC) 20 MG capsule Take 20 mg by mouth daily.   phenol (CHLORASEPTIC) 1.4 % LIQD Use as directed 1 spray in the mouth or throat as needed for throat irritation / pain.   QUEtiapine (SEROQUEL) 25 MG tablet Take 25 mg by mouth at bedtime.   SPIRIVA HANDIHALER 18 MCG inhalation capsule Place 18 mcg into inhaler and inhale daily.     Signature:  Chesley Mires, MD [place] Pager - [phone] 12/17/2021,  3:43 PM

## 2021-12-18 ENCOUNTER — Inpatient Hospital Stay (HOSPITAL_COMMUNITY): Payer: PPO

## 2021-12-18 DIAGNOSIS — J810 Acute pulmonary edema: Secondary | ICD-10-CM | POA: Diagnosis not present

## 2021-12-18 DIAGNOSIS — E860 Dehydration: Secondary | ICD-10-CM

## 2021-12-18 DIAGNOSIS — K529 Noninfective gastroenteritis and colitis, unspecified: Secondary | ICD-10-CM | POA: Diagnosis not present

## 2021-12-18 DIAGNOSIS — E876 Hypokalemia: Secondary | ICD-10-CM | POA: Diagnosis not present

## 2021-12-18 DIAGNOSIS — R6520 Severe sepsis without septic shock: Secondary | ICD-10-CM | POA: Diagnosis not present

## 2021-12-18 DIAGNOSIS — I5033 Acute on chronic diastolic (congestive) heart failure: Secondary | ICD-10-CM | POA: Diagnosis not present

## 2021-12-18 DIAGNOSIS — A419 Sepsis, unspecified organism: Secondary | ICD-10-CM | POA: Diagnosis not present

## 2021-12-18 LAB — COMPREHENSIVE METABOLIC PANEL
ALT: 23 U/L (ref 0–44)
AST: 36 U/L (ref 15–41)
Albumin: 1.7 g/dL — ABNORMAL LOW (ref 3.5–5.0)
Alkaline Phosphatase: 75 U/L (ref 38–126)
Anion gap: 8 (ref 5–15)
BUN: 6 mg/dL — ABNORMAL LOW (ref 8–23)
CO2: 19 mmol/L — ABNORMAL LOW (ref 22–32)
Calcium: 6.9 mg/dL — ABNORMAL LOW (ref 8.9–10.3)
Chloride: 110 mmol/L (ref 98–111)
Creatinine, Ser: 0.55 mg/dL — ABNORMAL LOW (ref 0.61–1.24)
GFR, Estimated: >60 mL/min (ref >60)
Glucose, Bld: 81 mg/dL (ref 70–99)
Potassium: 2.3 mmol/L — CL (ref 3.5–5.1)
Sodium: 137 mmol/L (ref 135–145)
Total Bilirubin: 0.8 mg/dL (ref 0.3–1.2)
Total Protein: 4.3 g/dL — ABNORMAL LOW (ref 6.5–8.1)

## 2021-12-18 LAB — CBC WITH DIFFERENTIAL/PLATELET
Abs Immature Granulocytes: 0.12 10*3/uL — ABNORMAL HIGH (ref 0.00–0.07)
Basophils Absolute: 0 10*3/uL (ref 0.0–0.1)
Basophils Relative: 0 %
Eosinophils Absolute: 0 10*3/uL (ref 0.0–0.5)
Eosinophils Relative: 0 %
HCT: 27.1 % — ABNORMAL LOW (ref 39.0–52.0)
Hemoglobin: 9.6 g/dL — ABNORMAL LOW (ref 13.0–17.0)
Immature Granulocytes: 1 %
Lymphocytes Relative: 12 %
Lymphs Abs: 1.8 10*3/uL (ref 0.7–4.0)
MCH: 35.6 pg — ABNORMAL HIGH (ref 26.0–34.0)
MCHC: 35.4 g/dL (ref 30.0–36.0)
MCV: 100.4 fL — ABNORMAL HIGH (ref 80.0–100.0)
Monocytes Absolute: 0.6 10*3/uL (ref 0.1–1.0)
Monocytes Relative: 4 %
Neutro Abs: 12.3 10*3/uL — ABNORMAL HIGH (ref 1.7–7.7)
Neutrophils Relative %: 83 %
Platelets: 129 10*3/uL — ABNORMAL LOW (ref 150–400)
RBC: 2.7 MIL/uL — ABNORMAL LOW (ref 4.22–5.81)
RDW: 15.9 % — ABNORMAL HIGH (ref 11.5–15.5)
WBC: 14.8 10*3/uL — ABNORMAL HIGH (ref 4.0–10.5)
nRBC: 0 % (ref 0.0–0.2)

## 2021-12-18 LAB — HEPARIN LEVEL (UNFRACTIONATED): Heparin Unfractionated: 0.33 IU/mL (ref 0.30–0.70)

## 2021-12-18 LAB — MAGNESIUM: Magnesium: 2.3 mg/dL (ref 1.7–2.4)

## 2021-12-18 MED ORDER — APIXABAN 5 MG PO TABS: 5.0000 mg | ORAL_TABLET | Freq: Two times a day (BID) | ORAL | Status: DC

## 2021-12-18 MED ORDER — NOREPINEPHRINE 4 MG/250ML-% IV SOLN
2.0000 ug/min | INTRAVENOUS | Status: DC
  Administered 2021-12-18 – 2021-12-19 (×2): 2 ug/min via INTRAVENOUS
  Filled 2021-12-18: qty 250

## 2021-12-18 MED ORDER — POTASSIUM CHLORIDE 10 MEQ/100ML IV SOLN
10.0000 meq | INTRAVENOUS | Status: AC
  Administered 2021-12-18 (×4): 10 meq via INTRAVENOUS
  Filled 2021-12-18 (×4): qty 100

## 2021-12-18 MED ORDER — FUROSEMIDE 10 MG/ML IJ SOLN
20.0000 mg | Freq: Once | INTRAMUSCULAR | Status: AC
  Administered 2021-12-18: 20 mg via INTRAVENOUS
  Filled 2021-12-18: qty 2

## 2021-12-18 MED ORDER — APIXABAN 5 MG PO TABS
10.0000 mg | ORAL_TABLET | Freq: Two times a day (BID) | ORAL | Status: DC
  Administered 2021-12-18 – 2021-12-20 (×4): 10 mg via ORAL
  Filled 2021-12-18 (×4): qty 2

## 2021-12-18 MED ORDER — POTASSIUM CHLORIDE 20 MEQ PO PACK: 40.0000 meq | PACK | Freq: Once | ORAL | Status: DC

## 2021-12-18 MED ORDER — POTASSIUM CHLORIDE CRYS ER 20 MEQ PO TBCR
40.0000 meq | EXTENDED_RELEASE_TABLET | ORAL | Status: AC
  Administered 2021-12-18 (×2): 40 meq via ORAL
  Filled 2021-12-18 (×2): qty 2

## 2021-12-18 MED ORDER — NOREPINEPHRINE 4 MG/250ML-% IV SOLN: 0.0000 ug/min | INTRAVENOUS | Status: DC

## 2021-12-18 NOTE — Progress Notes (Addendum)
Initial Nutrition Assessment  DOCUMENTATION CODES:   Severe malnutrition in context of chronic illness  INTERVENTION:  Ensure Enlive po BID, each supplement provides 350 kcal and 20 grams of protein   Snacks ad lib between meals from nursing nourishment room  NUTRITION DIAGNOSIS:   Severe Malnutrition related to chronic illness as evidenced by moderate fat depletion, severe fat depletion, moderate muscle depletion, severe muscle depletion, energy intake < or equal to 75% for > or equal to 1 month, BMI-17.3.    GOAL:  Patient will meet greater than or equal to 90% of their needs   MONITOR:  PO intake, Labs, Skin, Weight trends, Supplement acceptance  REASON FOR ASSESSMENT:   Consult Assessment of nutrition requirement/status  ASSESSMENT: Patient is an underweight 66 yo male with hx of COPD, CAD, HTN, alcohol and tobacco use. Hypotension and complaints of poor appetite on admission. Sepsis secondary to pancolitis per MD.   Patient consumes one meal daily mid-day and says a friend cooks for him. He likes Ensure and preferred snacks are sweet foods such as honey bun. Able to feed himself. He breakfast tray is here during RD visit but pt says he is not ready to eat.   Medications reviewed and include: ferrous sulfate, folic acid, Levaquin, MVI, Remeron, Nicoderm patch. Thiamine daily.   IV- NS@ 65 ml/hr.   Labs: hypokalemia BMP Latest Ref Rng & Units 12/18/2021 12/17/2021 12/16/2021  Glucose 70 - 99 mg/dL 81 99 182(X)  BUN 8 - 23 mg/dL 6(L) 10 14  Creatinine 0.61 - 1.24 mg/dL 9.37(J) 6.96 7.89  Sodium 135 - 145 mmol/L 137 139 135  Potassium 3.5 - 5.1 mmol/L 2.3(LL) 3.5 3.5  Chloride 98 - 111 mmol/L 110 111 107  CO2 22 - 32 mmol/L 19(L) 21(L) 21(L)  Calcium 8.9 - 10.3 mg/dL 6.9(L) 7.5(L) 7.4(L)      NUTRITION - FOCUSED PHYSICAL EXAM:  Flowsheet Row Most Recent Value  Orbital Region Severe depletion  Upper Arm Region Severe depletion  Thoracic and Lumbar Region Moderate  depletion  Buccal Region Moderate depletion  Temple Region Moderate depletion  Clavicle Bone Region Severe depletion  Clavicle and Acromion Bone Region Severe depletion  Scapular Bone Region Severe depletion  Dorsal Hand Moderate depletion  Patellar Region Severe depletion  Anterior Thigh Region Severe depletion  Edema (RD Assessment) Moderate  [left foot]  Hair Reviewed  Eyes Reviewed  Mouth Reviewed  Skin Reviewed  Nails Reviewed       Diet Order:   Diet Order             DIET DYS 3 Room service appropriate? Yes; Fluid consistency: Thin  Diet effective now                   EDUCATION NEEDS:  Education needs have been addressed  Skin:  Skin Assessment: Reviewed RN Assessment  Last BM:  1/5 type 7  Height:   Ht Readings from Last 1 Encounters:  12/17/21 5\' 11"  (1.803 m)    Weight:   Wt Readings from Last 1 Encounters:  12/18/21 56.3 kg    Ideal Body Weight:   78 kg  BMI:  Body mass index is 17.31 kg/m.  Estimated Nutritional Needs:   Kcal:  1900-2100  Protein:  90-99 gr  Fluid:  >1600 ml  02/15/22 MS,RD,CSG,LDN Contact: AMION

## 2021-12-18 NOTE — Progress Notes (Signed)
Physical Therapy Treatment Patient Details Name: Spencer Hickman MRN: [id number] DOB: [birth year] Today's Date: 12/18/2021   History of Present Illness Ardith Mulet  is a 66 y.o. male, with history of alcohol abuse, COPD, tobacco use disorder, coronary artery disease, hypertension and more presents ED with chief complaint of diarrhea.  Patient reports he had diarrhea intermittently for 2 to 3 weeks.  This current episode has been lasting for 3 to 5 days.  He reports some bowel incontinence.  He has associated dizziness upon standing, weakness, fatigue.  He reports his episodes of diarrhea are 2+ times per day.  They are nonbloody.  His stool is black but it has been ever since he started taking iron.  He denies any tarry stools.  He reports the stools are malodorous.  He has had no fever, no dysuria, no cough, no rashes.  He reports that today he came in because home health took his blood pressures that he was hypotensive, he had had some bowel incontinence, so they sent him into the ED.  Patient reports he did take his medications today including Cardizem.  He has not had any pain.  He has not had any emesis.  He reports that he has felt fine.  No recent antibiotic use.  No recent travel.  Patient reports that he is barely been out of his home for the last 3 years secondary to pandemic.  He has no history of C. difficile.  Patient does report a decrease in p.o. intake secondary to decrease in appetite.  He reports significant weight loss, possibly 60 pounds.  He was drinking a 12 pack of beer per day, but due to this decreased appetite he has only been drinking 2-4 beers per day.  He has barely any food intake.  Patient has no other complaints at this time.    PT Comments    Patient demonstrates increased endurance/distance for taking steps in room with slow labored movement, tolerated standing for up to 6-7 minutes while being cleaned by nursing staff, able to transfer to commode to finish bowel  movement and tolerated sitting up in chair after therapy - RN/NT aware.  Patient will benefit from continued skilled physical therapy in hospital and recommended venue below to increase strength, balance, endurance for safe ADLs and gait.     Recommendations for follow up therapy are one component of a multi-disciplinary discharge planning process, led by the attending physician.  Recommendations may be updated based on patient status, additional functional criteria and insurance authorization.  Follow Up Recommendations  Home health PT     Assistance Recommended at Discharge Intermittent Supervision/Assistance  Patient can return home with the following A lot of help with walking and/or transfers;A little help with bathing/dressing/bathroom;Assistance with cooking/housework   Equipment Recommendations  None recommended by PT    Recommendations for Other Services       Precautions / Restrictions Precautions Precautions: Fall Restrictions Weight Bearing Restrictions: No     Mobility  Bed Mobility Overal bed mobility: Needs Assistance Bed Mobility: Supine to Sit     Supine to sit: Min assist     General bed mobility comments: required assistance for moving BLE during supine to sitting    Transfers Overall transfer level: Needs assistance Equipment used: Rolling walker (2 wheels) Transfers: Sit to/from Stand;Bed to chair/wheelchair/BSC Sit to Stand: Min assist     Step pivot transfers: Min assist;Mod assist     General transfer comment: fair/good return for transferring to/from commode with slow  labored movement    Ambulation/Gait Ambulation/Gait assistance: Min assist;Mod assist Gait Distance (Feet): 12 Feet Assistive device: Rolling walker (2 wheels) Gait Pattern/deviations: Decreased step length - left;Decreased stance time - right;Decreased stride length Gait velocity: decreased     General Gait Details: increased endurance/distance for taking steps with slow  labored movement, fatigues easily, HR increases to 140's   Stairs             Wheelchair Mobility    Modified Rankin (Stroke Patients Only)       Balance Overall balance assessment: Needs assistance Sitting-balance support: Feet supported;No upper extremity supported Sitting balance-Leahy Scale: Fair Sitting balance - Comments: fair/good seated at EOB   Standing balance support: Reliant on assistive device for balance;During functional activity;Bilateral upper extremity supported Standing balance-Leahy Scale: Fair Standing balance comment: using RW                            Cognition Arousal/Alertness: Awake/alert Behavior During Therapy: WFL for tasks assessed/performed Overall Cognitive Status: Within Functional Limits for tasks assessed                                          Exercises      General Comments        Pertinent Vitals/Pain Pain Assessment: Faces Pain Score: 2  Faces Pain Scale: Hurts a little bit Pain Location: left shoulder blade Pain Descriptors / Indicators: Discomfort;Sore Pain Intervention(s): Limited activity within patient's tolerance;Monitored during session;Repositioned    Home Living                          Prior Function            PT Goals (current goals can now be found in the care plan section) Acute Rehab PT Goals Patient Stated Goal: return home with family and friends to assist PT Goal Formulation: With patient Potential to Achieve Goals: Good Progress towards PT goals: Progressing toward goals    Frequency    Min 3X/week      PT Plan Current plan remains appropriate    Co-evaluation              AM-PAC PT "6 Clicks" Mobility   Outcome Measure  Help needed turning from your back to your side while in a flat bed without using bedrails?: A Little Help needed moving from lying on your back to sitting on the side of a flat bed without using bedrails?: A  Little Help needed moving to and from a bed to a chair (including a wheelchair)?: A Little Help needed standing up from a chair using your arms (e.g., wheelchair or bedside chair)?: A Little Help needed to walk in hospital room?: A Lot Help needed climbing 3-5 steps with a railing? : A Lot 6 Click Score: 16    End of Session   Activity Tolerance: Patient tolerated treatment well;Patient limited by fatigue Patient left: in chair;with call bell/phone within reach Nurse Communication: Mobility status PT Visit Diagnosis: Unsteadiness on feet (R26.81);Other abnormalities of gait and mobility (R26.89);Muscle weakness (generalized) (M62.81)     Time: [id number] PT Time Calculation (min) (ACUTE ONLY): 28 min  Charges:  $Gait Training: 8-22 mins $Therapeutic Activity: 8-22 mins  3:58 PM, 12/18/21 Lonell Grandchild, MPT Physical Therapist with [facility] 336 [phone] office [phone] mobile phone

## 2021-12-18 NOTE — Progress Notes (Signed)
ANTICOAGULATION CONSULT NOTE -  Pharmacy Consult for heparin Indication: DVT  No Known Allergies  Patient Measurements: Height: 5\' 11"  (180.3 cm) Weight: 56.3 kg (124 lb 1.9 oz) IBW/kg (Calculated) : 75.3 Heparin Dosing Weight: 56 kg  Vital Signs: Temp: 97.8 F (36.6 C) (01/06 0400) Temp Source: Oral (01/06 0400) BP: 95/67 (01/06 0700) Pulse Rate: 89 (01/06 0700)  Labs: Recent Labs    12/15/21 1607 12/15/21 1817 12/16/21 0343 12/16/21 0343 12/17/21 0500 12/17/21 1306 12/17/21 2201 12/18/21 0429  HGB 10.4*  --  9.1*  --  10.1*  --   --  9.6*  HCT 29.9*  --  26.0*  --  28.1*  --   --  27.1*  PLT 135*  --  128*  --  153  --   --  129*  LABPROT  --   --  18.5*  --   --   --   --   --   INR  --   --  1.5*  --   --   --   --   --   HEPARINUNFRC  --   --   --    < > 0.15* 0.30 0.50 0.33  CREATININE 1.07  --  0.85  --  0.76  --   --  0.55*  CKTOTAL 293  --   --   --   --   --   --   --   TROPONINIHS 7 6  --   --   --   --   --   --    < > = values in this interval not displayed.     Estimated Creatinine Clearance: 71.4 mL/min (A) (by C-G formula based on SCr of 0.55 mg/dL (L)).   Medical History: Past Medical History:  Diagnosis Date   Alcohol abuse    COPD (chronic obstructive pulmonary disease) ([place])    Coronary artery disease    States "had a mild heart attack years ago.   Tobacco abuse 02/05/2019    Assessment:   66 y.o. male, with history of alcohol abuse, COPD, tobacco use disorder, coronary artery disease, hypertension. 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 Pharmacy consulted to initiate heparin. Not on anticoagulation prior to admission. CT angio is negative for PE  HL 0.33- therapeutic at lowest level Hgb 10.1  platelets WNL   Goal of Therapy:  Heparin level 0.3-0.7 units/ml Monitor platelets by anticoagulation protocol: Yes   Plan:  Continue Heparin infusion at 1200 units/hr Check heparin  level daily while on heparin Continue to monitor H&H and platelets F/U transition to po DOAC  Isac Sarna, BS Vena [place], BCPS Clinical Pharmacist Pager [phone] 12/18/2021 8:16 AM

## 2021-12-18 NOTE — Progress Notes (Signed)
BP 74/50 manual, MD notified, orders received

## 2021-12-18 NOTE — Progress Notes (Signed)
Speech Language Pathology Treatment: Dysphagia  Patient Details Name: UZAIR GODLEY MRN: [id number] DOB: Aug 23, 1956 Today's Date: 12/18/2021 Time: 5852-7782 SLP Time Calculation (min) (ACUTE ONLY): 17 min  Assessment / Plan / Recommendation Clinical Impression  Pt seen for ongoing dysphagia tx with limited intake d/t refusal of solids d/t decreased satiety as pt continues to experience difficulty with consuming medication (transitioning pills within pharynx without coughing); pt observed with thin via straw and puree without overt s/s of aspiration and min cues for encouragement d/t hesitancy with PO intake overall d/t recent incident with medication administration.  Nursing gave medication either crushed in puree or whole in puree without apparent difficulty noted.  SLP also gave recommendation for liquid wash and moistening oral mucosa prior to PO intake to assist with transition of foods/medication.  May consider esophageal assessment if s/s persist with medication lodging in pharynx to determine if esophageal component exists.  ST will continue to f/u x1 for diet tolerance while in acute setting.     HPI HPI: 66 y.o. male, with history of alcohol abuse, COPD, tobacco use disorder, coronary artery disease, hypertension and more presents ED with chief complaint of diarrhea.  Patient reports he had diarrhea intermittently for 2 to 3 weeks.  This current episode has been lasting for 3 to 5 days.  He reports some bowel incontinence.  He has associated dizziness upon standing, weakness, fatigue.  He reports his episodes of diarrhea are 2+ times per day.  They are nonbloody.  His stool is black but it has been ever since he started taking iron.  He denies any tarry stools.  He reports the stools are malodorous.  He has had no fever, no dysuria, no cough, no rashes.  He reports that today he came in because home health took his blood pressures that he was hypotensive, he had had some bowel incontinence, so  they sent him into the ED.  Patient reports he did take his medications today including Cardizem.  He has not had any pain.  He has not had any emesis.  He reports that he has felt fine.  No recent antibiotic use.  No recent travel.  Patient reports that he is barely been out of his home for the last 3 years secondary to pandemic.  He has no history of C. difficile.  Patient does report a decrease in p.o. intake secondary to decrease in appetite.  He reports significant weight loss, possibly 60 pounds.  He was drinking a 12 pack of beer per day, but due to this decreased appetite he has only been drinking 2-4 beers per day.  He has barely any food intake.  Patient has no other complaints at this time. Ther is concern that Pt may have aspirated a ferrous sulfate tablet this morning became tachypneic tachycardic with acute hypoxia, he was placed on NRB. BSE completed with Dysphagia 3/thin recommended with meds in puree.  ST continuing to f/u for diet tolerance/education.      SLP Plan  Continue with current plan of care      Recommendations for follow up therapy are one component of a multi-disciplinary discharge planning process, led by the attending physician.  Recommendations may be updated based on patient status, additional functional criteria and insurance authorization.    Recommendations  Diet recommendations: Dysphagia 3 (mechanical soft);Thin liquid Liquids provided via: Cup Medication Administration: Whole meds with puree (or crushed prn in puree) Supervision: Patient able to self feed Compensations: Slow rate;Small sips/bites  Oral Care Recommendations: Oral care BID;Patient independent with oral care Follow Up Recommendations: No SLP follow up Assistance recommended at discharge: None SLP Visit Diagnosis: Dysphagia, unspecified (R13.10) Plan: Continue with current plan of care           Tressie Stalker, M.S., CCC-SLP  12/18/2021, 1:38 PM

## 2021-12-18 NOTE — TOC Initial Note (Signed)
Transition of Care [facility]) - Initial/Assessment Note    Patient Details  Name: Spencer Hickman MRN: [id number] Date of Birth: May 27, 1956  Transition of Care [facility]) CM/SW Contact:    Boneta Lucks, RN Phone Number: 12/18/2021, 4:14 PM  Clinical Narrative:     Patient admitted with sepsis. PT is recommending HHPT. Patient is agreeable, no preferences. Linda with [facility] accepted the referral for HHPT.   TOC consulted for substance abuse. Patient is not interested. He states he will cut back on his own. TOC ask about AA meetings, he has not been and does not want to go.          Expected Discharge Plan: [place] Barriers to Discharge: Continued Medical Work up   Patient Goals and CMS Choice Patient states their goals for this hospitalization and ongoing recovery are:: to go home. CMS Medicare.gov Compare Post Acute Care list provided to:: Patient Choice offered to / list presented to : Patient  Expected Discharge Plan and Services Expected Discharge Plan: [place]      Living arrangements for the past 2 months: Single Family Home                  Prior Living Arrangements/Services Living arrangements for the past 2 months: Single Family Home Lives with:: Self    Current home services: DME   Activities of Daily Living Home Assistive Devices/Equipment: Environmental consultant (specify type) ADL Screening (condition at time of admission) Patient's cognitive ability adequate to safely complete daily activities?: Yes Is the patient deaf or have difficulty hearing?: No Does the patient have difficulty seeing, even when wearing glasses/contacts?: No Does the patient have difficulty concentrating, remembering, or making decisions?: No Patient able to express need for assistance with ADLs?: Yes Does the patient have difficulty dressing or bathing?: Yes Independently performs ADLs?: No Communication: Independent Dressing (OT): Needs assistance Is  this a change from baseline?: Pre-admission baseline Grooming: Independent Feeding: Independent Bathing: Needs assistance Is this a change from baseline?: Pre-admission baseline Toileting: Independent In/Out Bed: Independent Walks in Home: Independent with device (comment) Does the patient have difficulty walking or climbing stairs?: Yes Weakness of Legs: Both Weakness of Arms/Hands: None  Permission Sought/Granted     Emotional Assessment  Alcohol / Substance Use: Alcohol Use Psych Involvement: No (comment)  Admission diagnosis:  Edema [R60.9] Dehydration [E86.0] Alcohol abuse [F10.10] Pancolitis ([place]) [K51.00] Pulmonary nodules [R91.8] Failure to thrive in adult [R62.7] Sepsis ([place]) [A41.9] Patient Active Problem List   Diagnosis Date Noted   Sepsis ([place]) 12/15/2021   Protein calorie malnutrition ([place]) 12/15/2021   Alcohol abuse 12/15/2021   Colitis 12/15/2021   Protein-calorie malnutrition, severe 02/15/2020   Community acquired pneumonia of left lung    SOB (shortness of breath)    Symptomatic anemia 02/06/2019   Absolute anemia    Syncopal episodes 02/05/2019   Microcytic anemia 02/05/2019   COPD with acute exacerbation ([place]) 02/05/2019   Alcohol abuse with intoxication ([place]) 02/05/2019   Tobacco abuse 02/05/2019   Hypoalbuminemia 02/05/2019   Chest pain 02/05/2019   PCP:  Neale Burly, MD Pharmacy:   [facility] [address] [place] [address] [place] [address] Phone: [phone] Fax: [phone]  Readmission Risk Interventions Readmission Risk Prevention Plan 12/18/2021  Transportation Screening Complete  HRI or Home Care Consult Complete  Social Work Consult for Recovery Care Planning/Counseling Complete  Palliative Care Screening Complete  Medication Review (  RN Care Manager) Complete  Some recent data might be hidden

## 2021-12-18 NOTE — Progress Notes (Signed)
PROGRESS NOTE   Spencer Hickman  [id number] DOB: Jun 29, 1956 DOA: 12/15/2021 PCP: Neale Burly, MD   Chief Complaint  Patient presents with   Hypotension   Level of care: ICU  Brief Admission History:   66 y.o. male, with history of alcohol abuse, COPD, tobacco use disorder, coronary artery disease, hypertension and more presents ED with chief complaint of diarrhea.  Patient reports he had diarrhea intermittently for 2 to 3 weeks.  This current episode has been lasting for 3 to 5 days.  He reports some bowel incontinence.  He has associated dizziness upon standing, weakness, fatigue.  He reports his episodes of diarrhea are 2+ times per day.  They are nonbloody.  His stool is black but it has been ever since he started taking iron.  He denies any tarry stools.  He reports the stools are malodorous.  He has had no fever, no dysuria, no cough, no rashes.  He reports that today he came in because home health took his blood pressures that he was hypotensive, he had had some bowel incontinence, so they sent him into the ED.  Patient reports he did take his medications today including Cardizem.  He has not had any pain.  He has not had any emesis.  He reports that he has felt fine.  No recent antibiotic use.  No recent travel.  Patient reports that he is barely been out of his home for the last 3 years secondary to pandemic.  He has no history of C. difficile.  Patient does report a decrease in p.o. intake secondary to decrease in appetite.  He reports significant weight loss, possibly 60 pounds.  He was drinking a 12 pack of beer per day, but due to this decreased appetite he has only been drinking 2-4 beers per day.  He has barely any food intake.  Patient has no other complaints at this time.  Assessment & Plan:   Principal Problem:   Sepsis ([place]) Active Problems:   Tobacco abuse   Protein-calorie malnutrition, severe   Protein calorie malnutrition ([place])   Alcohol abuse   Colitis  Sepsis  secondary to pancolitis - continue IV antibiotics and supportive measures  - trend lactic acid to resolution - IV fluid hydration - follow cultures: no growth to date   Acute respiratory failure with hypoxia  - secondary to acute aspiration  - initially was planning to intubate emergently - fortunately patient recovered shortly afterwards  - discussed with pulmonologist Dr. Halford Chessman  Pancolitis - continue IV antibiotics - continue supportive measures  Chronic alcohol abuse  - CIWA protocol - IV fluids and vitamins ordered   Acute pulmonary edema  - follow up Echo and chest xray Left ventricular ejection fraction, by estimation, is 60 to 65%. The left ventricle has normal function. The left ventricle has no regional wall motion abnormalities. Left ventricular diastolic parameters are consistent with Grade III diastolic  dysfunction (restrictive). Elevated left atrial pressure.  Tobacco  - nicotine patch ordered   Hypotension  - wean off norepinephrine   - continue midodrine  LLE edema  - check venous doppler--->acute/chronic DVT - started heparin IV - transition to apixaban 1/6   - placed TED hose  DVT prophylaxis: IV heparin ---> apixaban  Code Status: full  Family Communication: none present  Disposition: anticipate DC home with home health  Status is: Inpatient  Remains inpatient appropriate because: IV pressor support needed     Consultants:  Critical care - due to  pressors, acute respiratory distress  Procedures:    Antimicrobials:  Levofloxacin 1/5>> Metronidazole 1/4>>  Subjective: Pt seems to have more appetite today.     Objective: Vitals:   12/18/21 0700 12/18/21 0903 12/18/21 1100 12/18/21 1600  BP: 95/67     Pulse: 89     Resp: 14     Temp:   97.6 F (36.4 C) 97.7 F (36.5 C)  TempSrc:   Oral Oral  SpO2: 99% 91%    Weight:      Height:        Intake/Output Summary (Last 24 hours) at 12/18/2021 1657 Last data filed at 12/18/2021  1632 Gross per 24 hour  Intake 2200.96 ml  Output 650 ml  Net 1550.96 ml   Filed Weights   12/15/21 1857 12/17/21 0900 12/18/21 0500  Weight: 56 kg 52.8 kg 56.3 kg    Examination:  General exam: thin, chronically ill appearing male,very anxious   Respiratory system: tachypneic, diminished BS LLL.  Cardiovascular system: normal S1 & S2 heard. No JVD, murmurs, rubs, gallops or clicks. No pedal edema. Gastrointestinal system: Abdomen is nondistended, soft and nontender. No organomegaly or masses felt. Normal bowel sounds heard. Central nervous system: Alert and oriented. No focal neurological deficits. Extremities: Symmetric 5 x 5 power. Skin: No rashes, lesions or ulcers Psychiatry: Judgement and insight appear poor. Mood & affect appropriate.   Data Reviewed: I have personally reviewed following labs and imaging studies  CBC: Recent Labs  Lab 12/15/21 1607 12/16/21 0343 12/17/21 0500 12/18/21 0429  WBC 16.1* 15.1* 19.4* 14.8*  NEUTROABS 11.8* 14.4* 17.1* 12.3*  HGB 10.4* 9.1* 10.1* 9.6*  HCT 29.9* 26.0* 28.1* 27.1*  MCV 103.8* 104.8* 102.9* 100.4*  PLT 135* 128* 153 129*    Basic Metabolic Panel: Recent Labs  Lab 12/15/21 1607 12/16/21 0343 12/17/21 0500 12/18/21 0429  NA 131* 135 139 137  K 3.6 3.5 3.5 2.3*  CL 98 107 111 110  CO2 24 21* 21* 19*  GLUCOSE 110* 138* 99 81  BUN 15 14 10  6*  CREATININE 1.07 0.85 0.76 0.55*  CALCIUM 7.4* 7.4* 7.5* 6.9*  MG  --  1.6* 1.5* 2.3    GFR: Estimated Creatinine Clearance: 71.4 mL/min (A) (by C-G formula based on SCr of 0.55 mg/dL (L)).  Liver Function Tests: Recent Labs  Lab 12/15/21 1607 12/16/21 0343 12/17/21 0500 12/18/21 0429  AST 36 32 30 36  ALT 29 23 24 23   ALKPHOS 98 72 74 75  BILITOT 1.5* 1.4* 1.1 0.8  PROT 5.0* 5.1* 4.8* 4.3*  ALBUMIN 1.5* 2.5* 2.1* 1.7*    CBG: No results for input(s): GLUCAP in the last 168 hours.  Recent Results (from the past 240 hour(s))  Resp Panel by RT-PCR (Flu A&B,  Covid) Nasopharyngeal Swab     Status: None   Collection Time: 12/15/21  5:05 PM   Specimen: Nasopharyngeal Swab; Nasopharyngeal(NP) swabs in vial transport medium  Result Value Ref Range Status   SARS Coronavirus 2 by RT PCR NEGATIVE NEGATIVE Final    Comment: (NOTE) SARS-CoV-2 target nucleic acids are NOT DETECTED.  The SARS-CoV-2 RNA is generally detectable in upper respiratory specimens during the acute phase of infection. The lowest concentration of SARS-CoV-2 viral copies this assay can detect is 138 copies/mL. A negative result does not preclude SARS-Cov-2 infection and should not be used as the sole basis for treatment or other patient management decisions. A negative result may occur with  improper specimen collection/handling, submission of specimen  other than nasopharyngeal swab, presence of viral mutation(s) within the areas targeted by this assay, and inadequate number of viral copies(<138 copies/mL). A negative result must be combined with clinical observations, patient history, and epidemiological information. The expected result is Negative.  Fact Sheet for Patients:  EntrepreneurPulse.com.au  Fact Sheet for Healthcare Providers:  IncredibleEmployment.be  This test is no t yet approved or cleared by the [place] FDA and  has been authorized for detection and/or diagnosis of SARS-CoV-2 by FDA under an Emergency Use Authorization (EUA). This EUA will remain  in effect (meaning this test can be used) for the duration of the COVID-19 declaration under Section 564(b)(1) of the Act, 21 U.S.C.section 360bbb-3(b)(1), unless the authorization is terminated  or revoked sooner.       Influenza A by PCR NEGATIVE NEGATIVE Final   Influenza B by PCR NEGATIVE NEGATIVE Final    Comment: (NOTE) The Xpert Xpress SARS-CoV-2/FLU/RSV plus assay is intended as an aid in the diagnosis of influenza from Nasopharyngeal swab specimens and should  not be used as a sole basis for treatment. Nasal washings and aspirates are unacceptable for Xpert Xpress SARS-CoV-2/FLU/RSV testing.  Fact Sheet for Patients: EntrepreneurPulse.com.au  Fact Sheet for Healthcare Providers: IncredibleEmployment.be  This test is not yet approved or cleared by the [place] FDA and has been authorized for detection and/or diagnosis of SARS-CoV-2 by FDA under an Emergency Use Authorization (EUA). This EUA will remain in effect (meaning this test can be used) for the duration of the COVID-19 declaration under Section 564(b)(1) of the Act, 21 U.S.C. section 360bbb-3(b)(1), unless the authorization is terminated or revoked.  Performed at [facility], [address] [place], [place] [address]   Blood culture (routine x 2)     Status: None (Preliminary result)   Collection Time: 12/15/21  7:19 PM   Specimen: BLOOD LEFT HAND  Result Value Ref Range Status   Specimen Description BLOOD LEFT HAND  Final   Special Requests   Final    BOTTLES DRAWN AEROBIC AND ANAEROBIC Blood Culture results may not be optimal due to an inadequate volume of blood received in culture bottles   Culture   Final    NO GROWTH 3 DAYS Performed at [facility], [address] [place], [place] [address]    Report Status PENDING  Incomplete  Blood culture (routine x 2)     Status: None (Preliminary result)   Collection Time: 12/15/21  7:19 PM   Specimen: BLOOD RIGHT HAND  Result Value Ref Range Status   Specimen Description BLOOD RIGHT HAND  Final   Special Requests   Final    BOTTLES DRAWN AEROBIC ONLY Blood Culture results may not be optimal due to an inadequate volume of blood received in culture bottles   Culture   Final    NO GROWTH 3 DAYS Performed at [facility], [address], [place], [place] [address]    Report Status PENDING  Incomplete  Urine Culture     Status: None   Collection Time: 12/16/21  6:20 AM   Specimen: Urine,  Clean Catch  Result Value Ref Range Status   Specimen Description   Final    URINE, CLEAN CATCH Performed at [facility], [address], [place], [place] [address]    Special Requests   Final    NONE Performed at [facility], [address], [place], [place] [address]    Culture   Final    NO GROWTH Performed at [facility], [place] [address] [place], [place] [address]    Report Status 12/17/2021 FINAL  Final  MRSA Next Gen by PCR, Nasal     Status: None   Collection Time: 12/17/21  9:07 AM   Specimen: Nasal Mucosa; Nasal Swab  Result Value Ref Range Status   MRSA by PCR Next Gen NOT DETECTED NOT DETECTED Final    Comment: (NOTE) The GeneXpert MRSA Assay (FDA approved for NASAL specimens only), is one component of a comprehensive MRSA colonization surveillance program. It is not intended to diagnose MRSA infection nor to guide or monitor treatment for MRSA infections. Test performance is not FDA approved in patients less than 18 years old. Performed at [facility], [address] [place], [place] [address]      Radiology Studies: [facility] Chest [facility] 1 View  Result Date: 12/18/2021 CLINICAL DATA:  Respiratory distress.  Alcohol abuse.  COPD. EXAM: PORTABLE CHEST 1 VIEW COMPARISON:  12/17/2021 FINDINGS: Two portable radiographs. Midline trachea. Normal heart size. Hyperinflation and interstitial thickening, consistent with COPD/chronic bronchitis. Numerous leads and wires project over the chest. Small right and trace left pleural effusions, increased. No pneumothorax. Interstitial edema is mild and progressive. Worsening right greater than left lower lung predominant airspace disease. IMPRESSION: Worsened aeration, with developing pulmonary edema and increasing pleural effusions, superimposed upon COPD/chronic bronchitis. Right greater than left base airspace disease is increased. Likely atelectasis. Within the right lower lobe, pneumonia cannot be excluded. Electronically Signed   By:  Abigail Miyamoto M.D.   On: 12/18/2021 08:18   DG CHEST PORT 1 VIEW  Result Date: 12/17/2021 CLINICAL DATA:  Hypoxia EXAM: PORTABLE CHEST 1 VIEW COMPARISON:  Chest x-ray earlier the same day, CT chest 12/15/2021 FINDINGS: Heart size is normal. Mediastinum is stable. Hyperinflated lungs with chronic interstitial emphysematous changes. Hazy opacity in the right lower lung zone consistent with small layering pleural effusion. Mild opacities at the left lung base which may represent subsegmental atelectasis or early infiltrate. No pneumothorax visualized. IMPRESSION: 1. Mild opacities at the left lung base which may represent subsegmental atelectasis or early infiltrate. 2. Small layering right pleural effusion. 3. Emphysema. Electronically Signed   By: Ofilia Neas M.D.   On: 12/17/2021 10:27   DG CHEST PORT 1 VIEW  Result Date: 12/17/2021 CLINICAL DATA:  66 year old male with sepsis. EXAM: PORTABLE CHEST 1 VIEW COMPARISON:  CTA chest 12/15/2021 and earlier. FINDINGS: Portable AP upright view at 0503 hours. Increased veiling opacity in the right lower lung from the recent CTA. Underlying large lung volumes and emphysema redemonstrated. Normal cardiac size and mediastinal contours. No pneumothorax. No acute osseous abnormality identified. Negative visible bowel gas. IMPRESSION: 1. Evidence of increasing right pleural effusion since recent CTA, although likely still small volume. 2.  Emphysema (ICD10-J43.9). Electronically Signed   By: Genevie Ann M.D.   On: 12/17/2021 05:19    Scheduled Meds:  Chlorhexidine Gluconate Cloth  6 each Topical Daily   feeding supplement  237 mL Oral BID BM   ferrous sulfate  325 mg Oral Q breakfast   folic acid  1 mg Oral Daily   levofloxacin  750 mg Oral Daily   midodrine  10 mg Oral TID WC   mirtazapine  15 mg Oral QHS   multivitamin with minerals  1 tablet Oral Daily   nicotine  21 mg Transdermal Daily   pneumococcal 23 valent vaccine  0.5 mL Intramuscular Tomorrow-1000    potassium chloride  40 mEq Oral Q4H   QUEtiapine  25 mg Oral QHS   thiamine  100 mg Oral Daily   Or   thiamine  100 mg Intravenous Daily   umeclidinium bromide  1 puff Inhalation Daily   Continuous Infusions:  sodium chloride 10 mL/hr at 12/18/21 1448   sodium chloride     heparin 1,200 Units/hr (12/18/21 1632)   metronidazole Stopped (12/18/21 [id number])   norepinephrine (LEVOPHED) Adult infusion Stopped (12/18/21 0818)     LOS: 3 days   Critical Care Procedure Note Authorized and Performed by: Murvin Natal MD  Total Critical Care time:  40 mins Due to a high probability of clinically significant, life threatening deterioration, the patient required my highest level of preparedness to intervene emergently and I personally spent this critical care time directly and personally managing the patient.  This critical care time included obtaining a history; examining the patient, pulse oximetry; ordering and review of studies; arranging urgent treatment with development of a management plan; evaluation of patient's response of treatment; frequent reassessment; and discussions with other providers.  This critical care time was performed to assess and manage the high probability of imminent and life threatening deterioration that could result in multi-organ failure.  It was exclusive of separately billable procedures and treating other patients and teaching time.    Irwin Brakeman, MD How to contact the [facility] Attending or Consulting provider [place] or covering provider during after hours [place], for this patient?  Check the care team in [facility] and look for a) attending/consulting [facility] provider listed and b) the [facility] team listed Log into [email or web address] and use [place]'s universal password to access. If you do not have the password, please contact the hospital operator. Locate the [facility] provider you are looking for under [facility] and page to a number that you can be directly reached. If you still  have difficulty reaching the provider, please page the [facility] (Director on Call) for the Hospitalists listed on amion for assistance.  12/18/2021, 4:57 PM

## 2021-12-18 NOTE — Progress Notes (Signed)
Potassium 2.3, MD notified, orders received

## 2021-12-18 NOTE — Progress Notes (Signed)
[facility]   Patient name: Spencer Hickman Admit date: 12/15/2021  DOB: March 07, 1956 LOS: 3  MRN: [id number] Consult date: 12/17/2021  Referring provider: Dr. Wynetta Emery, [facility]: Aspirated a pill    History:  66 yo male smoker presented to [facility] with anorexia, diarrhea, dizziness, weakness, and fatigue.  Admitted with sepsis from pancolitis.  Tried swallowing several pills at once on 12/17/21.  Felt like he aspirated his iron pill.  After this he developed severe respiratory distress and hypoxia.  PCCM asked to assess in ICU.  Past medical history:  ETOH, COPD, CAD, HTN  Significant events:  1/03 Admit 1/05 possible aspiration of iron pill  Studies:  CT angio chest 12/15/21 >> atherosclerosis, moderate centrilobular emphysema, few micronodules, small Rt effusion CT abd/pelvis 12/15/21 >> diffuse bowel wall thickening of colon Echo 12/16/21 >> EF 60 to 65%, grade 3 DD Doppler legs 12/16/21 >> acute/chronic DVT in Lt peroneal and posterior tibial veins  Micro:  COVID/flu 1/03 >> negative Blood 1/03 >>   Lines:     Antibiotics:  Vancomycin 1/03 >> 1/04 Flagyl 1/03 >>  Cefepime 1/03 >> 1/04 Levaquin 1/05 >>   Consults:      Interim history:  Cough better.  Tolerating diet better.  Not having chest pain.  BP up.  Vital signs:  BP 95/67    Pulse 89    Temp 97.6 F (36.4 C) (Oral)    Resp 14    Ht 5\' 11"  (1.803 m)    Wt 56.3 kg    SpO2 91%    BMI 17.31 kg/m   Intake/output:  I/O last 3 completed shifts: In: 3595.6 [I.V.:3195.7; IV Piggyback:399.9] Out: 250 [Urine:250]   Physical exam:   General - alert, thin, sitting in chair Eyes - pupils reactive ENT - no sinus tenderness, no stridor Cardiac - regular, tachycardic Chest - b/l rhonchi Abdomen - soft, non tender, + bowel sounds Extremities - no cyanosis, clubbing, or edema Skin - no rashes Neuro - normal strength, moves extremities, follows commands Psych -  normal mood and behavior  Best practice:   DVT - heparin gtt SUP - Not indicated Nutrition - D3    Assessment/plan:   Acute respiratory distress after possibly aspirating iron pill. - resolved  COPD with emphysema. - continue inhaler regimen  Acute on chronic diastolic CHF with acute pulmonary edema. - lasix with goal of negative fluid balance - antihypertensive therapy per primary team  Hypokalemia. - give additional potassium replacement and f/u BMET and Mg  Tobacco abuse. - nicotine patch  Dysphagia. - advanced to D3 diet  Pan-colitis. - ABx per primary team  Hx of ETOH. - continue thiamine, folic acid  Cachexia. - advance diet as tolerated - might benefit from nutrition assessment  Anemia of chronic disease. - f/u CBC - transfuse for Hb < 7   Lt leg DVT. - heparin gtt  PCCM will sign off.  Please call if additional help needed while he is in hospital  Resolved hospital problems:    Goals of care/Family discussions:  Code status: full code  Labs:   CMP Latest Ref Rng & Units 12/18/2021 12/17/2021 12/16/2021  Glucose 70 - 99 mg/dL 81 99 138(H)  BUN 8 - 23 mg/dL 6(L) 10 14  Creatinine 0.61 - 1.24 mg/dL 0.55(L) 0.76 0.85  Sodium 135 - 145 mmol/L 137 139 135  Potassium 3.5 - 5.1 mmol/L 2.3(LL) 3.5 3.5  Chloride 98 - 111 mmol/L 110 111 107  CO2 22 - 32 mmol/L 19(L) 21(L) 21(L)  Calcium 8.9 - 10.3 mg/dL 6.9(L) 7.5(L) 7.4(L)  Total Protein 6.5 - 8.1 g/dL 4.3(L) 4.8(L) 5.1(L)  Total Bilirubin 0.3 - 1.2 mg/dL 0.8 1.1 1.4(H)  Alkaline Phos 38 - 126 U/L 75 74 72  AST 15 - 41 U/L 36 30 32  ALT 0 - 44 U/L 23 24 23     CBC Latest Ref Rng & Units 12/18/2021 12/17/2021 12/16/2021  WBC 4.0 - 10.5 K/uL 14.8(H) 19.4(H) 15.1(H)  Hemoglobin 13.0 - 17.0 g/dL 9.6(L) 10.1(L) 9.1(L)  Hematocrit 39.0 - 52.0 % 27.1(L) 28.1(L) 26.0(L)  Platelets 150 - 400 K/uL 129(L) 153 128(L)    ABG No results found for: PHART, PCO2ART, PO2ART, HCO3, TCO2, ACIDBASEDEF, O2SAT  CBG (last 3)   No results for input(s): GLUCAP in the last 72 hours.  CXR 12/18/21 - pulmonary edema pattern, basilar atelectasis  Signature:  Chesley Mires, MD [place] Pager - [phone] 12/18/2021, 2:29 PM

## 2021-12-18 NOTE — Progress Notes (Signed)
ANTICOAGULATION CONSULT NOTE - Initial Consult  Pharmacy Consult for apixaban dosing Indication: DVT  No Known Allergies  Patient Measurements: Height: 5\' 11"  (180.3 cm) Weight: 56.3 kg (124 lb 1.9 oz) IBW/kg (Calculated) : 75.3   Vital Signs: Temp: 97.7 F (36.5 C) (01/06 1600) Temp Source: Oral (01/06 1600) BP: 95/67 (01/06 0700) Pulse Rate: 89 (01/06 0700)  Labs: Recent Labs    12/16/21 0343 12/16/21 0343 12/17/21 0500 12/17/21 1306 12/17/21 2201 12/18/21 0429  HGB 9.1*  --  10.1*  --   --  9.6*  HCT 26.0*  --  28.1*  --   --  27.1*  PLT 128*  --  153  --   --  129*  LABPROT 18.5*  --   --   --   --   --   INR 1.5*  --   --   --   --   --   HEPARINUNFRC  --    < > 0.15* 0.30 0.50 0.33  CREATININE 0.85  --  0.76  --   --  0.55*   < > = values in this interval not displayed.    Estimated Creatinine Clearance: 71.4 mL/min (A) (by C-G formula based on SCr of 0.55 mg/dL (L)). Lab Results  Component Value Date   CREATININE 0.55 (L) 12/18/2021   CREATININE 0.76 12/17/2021   CREATININE 0.85 12/16/2021   Filed Weights   12/15/21 1857 12/17/21 0900 12/18/21 0500  Weight: 56 kg (123 lb 7.3 oz) 52.8 kg (116 lb 6.5 oz) 56.3 kg (124 lb 1.9 oz)     Medical History: Past Medical History:  Diagnosis Date   Alcohol abuse    COPD (chronic obstructive pulmonary disease) (HCC)    Coronary artery disease    States "had a mild heart attack years ago.   Tobacco abuse 02/05/2019    Medications:  Medications Prior to Admission  Medication Sig Dispense Refill Last Dose   albuterol (VENTOLIN HFA) 108 (90 Base) MCG/ACT inhaler Inhale 2 puffs into the lungs every 6 (six) hours as needed for shortness of breath.   12/15/2021   dextromethorphan (DELSYM) 30 MG/5ML liquid Take 5 mLs (30 mg total) by mouth 2 (two) times daily as needed for cough. 89 mL 0 unknown   diltiazem (CARDIZEM CD) 120 MG 24 hr capsule Take 120 mg by mouth daily.   12/15/2021   ferrous sulfate 325 (65 FE) MG  tablet Take 1 tablet (325 mg total) by mouth daily with breakfast. 30 tablet 1 [id number]   folic acid (FOLVITE) 1 MG tablet Take 1 tablet (1 mg total) by mouth daily. 30 tablet 1 12/15/2021   metoprolol succinate (TOPROL-XL) 50 MG 24 hr tablet Take 50 mg by mouth daily.   12/14/2021 at 01200   mirtazapine (REMERON) 15 MG tablet Take 15 mg by mouth at bedtime.   12/14/2021   Multiple Vitamin (MULTIVITAMIN WITH MINERALS) TABS tablet Take 1 tablet by mouth daily. 30 tablet 1 12/15/2021   omeprazole (PRILOSEC) 20 MG capsule Take 20 mg by mouth daily.   12/15/2021   phenol (CHLORASEPTIC) 1.4 % LIQD Use as directed 1 spray in the mouth or throat as needed for throat irritation / pain.   12/15/2021   QUEtiapine (SEROQUEL) 25 MG tablet Take 25 mg by mouth at bedtime.   12/14/2021   SPIRIVA HANDIHALER 18 MCG inhalation capsule Place 18 mcg into inhaler and inhale daily.   12/15/2021   Scheduled:   apixaban  10 mg Oral  BID   Followed by   Derrill Memo ON 12/25/2021] apixaban  5 mg Oral BID   Chlorhexidine Gluconate Cloth  6 each Topical Daily   feeding supplement  237 mL Oral BID BM   ferrous sulfate  325 mg Oral Q breakfast   folic acid  1 mg Oral Daily   levofloxacin  750 mg Oral Daily   midodrine  10 mg Oral TID WC   mirtazapine  15 mg Oral QHS   multivitamin with minerals  1 tablet Oral Daily   nicotine  21 mg Transdermal Daily   pneumococcal 23 valent vaccine  0.5 mL Intramuscular Tomorrow-1000   potassium chloride  40 mEq Oral Q4H   QUEtiapine  25 mg Oral QHS   thiamine  100 mg Oral Daily   Or   thiamine  100 mg Intravenous Daily   umeclidinium bromide  1 puff Inhalation Daily   Infusions:   sodium chloride 10 mL/hr at 12/18/21 1448   sodium chloride     metronidazole Stopped (12/18/21 0918)   norepinephrine (LEVOPHED) Adult infusion Stopped (12/18/21 0818)   PRN: acetaminophen **OR** acetaminophen, albuterol, LORazepam **OR** LORazepam, ondansetron **OR** ondansetron (ZOFRAN) IV, oxyCODONE,  traZODone Anti-infectives (From admission, onward)    Start     Dose/Rate Route Frequency Ordered Stop   12/17/21 1000  levofloxacin (LEVAQUIN) tablet 750 mg        750 mg Oral Daily 12/17/21 0729 12/22/21 0959   12/16/21 2000  vancomycin (VANCOCIN) IVPB 1000 mg/200 mL premix  Status:  Discontinued        1,000 mg 200 mL/hr over 60 Minutes Intravenous Every 24 hours 12/15/21 1917 12/17/21 0729   12/16/21 1400  ceFEPIme (MAXIPIME) 2 g in sodium chloride 0.9 % 100 mL IVPB  Status:  Discontinued        2 g 200 mL/hr over 30 Minutes Intravenous Every 8 hours 12/16/21 0813 12/17/21 0729   12/16/21 0600  ceFEPIme (MAXIPIME) 2 g in sodium chloride 0.9 % 100 mL IVPB  Status:  Discontinued        2 g 200 mL/hr over 30 Minutes Intravenous Every 12 hours 12/15/21 1917 12/16/21 0813   12/15/21 2030  metroNIDAZOLE (FLAGYL) IVPB 500 mg        500 mg 100 mL/hr over 60 Minutes Intravenous Every 12 hours 12/15/21 2029     12/15/21 1915  vancomycin (VANCOCIN) IVPB 1000 mg/200 mL premix        1,000 mg 200 mL/hr over 60 Minutes Intravenous STAT 12/15/21 1900 12/15/21 2122   12/15/21 1900  ceFEPIme (MAXIPIME) 2 g in sodium chloride 0.9 % 100 mL IVPB        2 g 200 mL/hr over 30 Minutes Intravenous STAT 12/15/21 1852 12/15/21 2010       Assessment: Spencer Hickman a 66 y.o. male requires anticoagulation with apixaban for the indication of  DVT. Apixban will be started following pharmacy protocol per pharmacy consult.   Goal of Therapy:  Patient to be properly anticoagulated with apixaban Monitor platelets by anticoagulation protocol: yes   Plan:  Stop heparin infusion, within 2 hours  Apixban 10mg  po BID x 7 days, followed by apixaban 5mg  po BID, duration of therapy to be determined by following physician  CBC MWF Monitor for signs and symptoms of bleeding   Thomasenia Sales, PharmD Clinical Pharmacist 12/18/2021,5:00 PM

## 2021-12-19 DIAGNOSIS — E876 Hypokalemia: Secondary | ICD-10-CM | POA: Diagnosis not present

## 2021-12-19 DIAGNOSIS — I5033 Acute on chronic diastolic (congestive) heart failure: Secondary | ICD-10-CM | POA: Diagnosis not present

## 2021-12-19 DIAGNOSIS — J810 Acute pulmonary edema: Secondary | ICD-10-CM

## 2021-12-19 LAB — BASIC METABOLIC PANEL
Anion gap: 7 (ref 5–15)
BUN: 5 mg/dL — ABNORMAL LOW (ref 8–23)
CO2: 19 mmol/L — ABNORMAL LOW (ref 22–32)
Calcium: 7 mg/dL — ABNORMAL LOW (ref 8.9–10.3)
Chloride: 111 mmol/L (ref 98–111)
Creatinine, Ser: 0.6 mg/dL — ABNORMAL LOW (ref 0.61–1.24)
GFR, Estimated: >60 mL/min (ref >60)
Glucose, Bld: 79 mg/dL (ref 70–99)
Potassium: 2.8 mmol/L — ABNORMAL LOW (ref 3.5–5.1)
Sodium: 137 mmol/L (ref 135–145)

## 2021-12-19 LAB — MAGNESIUM: Magnesium: 1.9 mg/dL (ref 1.7–2.4)

## 2021-12-19 LAB — CORTISOL-AM, BLOOD: Cortisol - AM: 15.3 ug/dL (ref 6.7–22.6)

## 2021-12-19 MED ORDER — MIDODRINE HCL 5 MG PO TABS: 15.0000 mg | ORAL_TABLET | Freq: Three times a day (TID) | ORAL | Status: DC

## 2021-12-19 MED ORDER — SODIUM CHLORIDE 0.9 % IV BOLUS
500.0000 mL | Freq: Once | INTRAVENOUS | Status: AC
  Administered 2021-12-19: 500 mL via INTRAVENOUS

## 2021-12-19 MED ORDER — MIDODRINE HCL 5 MG PO TABS
10.0000 mg | ORAL_TABLET | Freq: Three times a day (TID) | ORAL | Status: DC
  Administered 2021-12-19 – 2021-12-20 (×5): 10 mg via ORAL
  Filled 2021-12-19 (×5): qty 2

## 2021-12-19 MED ORDER — MAGNESIUM SULFATE 2 GM/50ML IV SOLN
2.0000 g | Freq: Once | INTRAVENOUS | Status: AC
  Administered 2021-12-19: 2 g via INTRAVENOUS
  Filled 2021-12-19: qty 50

## 2021-12-19 MED ORDER — POTASSIUM CHLORIDE 10 MEQ/100ML IV SOLN
10.0000 meq | INTRAVENOUS | Status: AC
  Administered 2021-12-19 (×3): 10 meq via INTRAVENOUS
  Filled 2021-12-19 (×3): qty 100

## 2021-12-19 MED ORDER — METRONIDAZOLE 500 MG PO TABS
500.0000 mg | ORAL_TABLET | Freq: Two times a day (BID) | ORAL | Status: DC
  Administered 2021-12-19 – 2021-12-20 (×3): 500 mg via ORAL
  Filled 2021-12-19 (×3): qty 1

## 2021-12-19 NOTE — Plan of Care (Signed)

## 2021-12-19 NOTE — Progress Notes (Signed)
PROGRESS NOTE   Spencer Hickman  [id number] DOB: 05-17-1956 DOA: 12/15/2021 PCP: Neale Burly, MD   Chief Complaint  Patient presents with   Hypotension   Level of care: Stepdown  Brief Admission History:   66 y.o. male, with history of alcohol abuse, COPD, tobacco use disorder, coronary artery disease, hypertension and more presents ED with chief complaint of diarrhea.  Patient reports he had diarrhea intermittently for 2 to 3 weeks.  This current episode has been lasting for 3 to 5 days.  He reports some bowel incontinence.  He has associated dizziness upon standing, weakness, fatigue.  He reports his episodes of diarrhea are 2+ times per day.  They are nonbloody.  His stool is black but it has been ever since he started taking iron.  He denies any tarry stools.  He reports the stools are malodorous.  He has had no fever, no dysuria, no cough, no rashes.  He reports that today he came in because home health took his blood pressures that he was hypotensive, he had had some bowel incontinence, so they sent him into the ED.  Patient reports he did take his medications today including Cardizem.  He has not had any pain.  He has not had any emesis.  He reports that he has felt fine.  No recent antibiotic use.  No recent travel.  Patient reports that he is barely been out of his home for the last 3 years secondary to pandemic.  He has no history of C. difficile.  Patient does report a decrease in p.o. intake secondary to decrease in appetite.  He reports significant weight loss, possibly 60 pounds.  He was drinking a 12 pack of beer per day, but due to this decreased appetite he has only been drinking 2-4 beers per day.  He has barely any food intake.  Patient has no other complaints at this time.  Assessment & Plan:   Principal Problem:   Sepsis ([place]) Active Problems:   Tobacco abuse   Protein-calorie malnutrition, severe   Protein calorie malnutrition ([place])   Alcohol abuse   Colitis    Dehydration  Sepsis secondary to pancolitis - continue IV antibiotics and supportive measures  - trend lactic acid to resolution - IV fluid hydration - follow cultures: no growth to date   Acute respiratory failure with hypoxia  - secondary to acute aspiration  - initially was planning to intubate emergently - fortunately patient recovered shortly afterwards  - discussed with pulmonologist Dr. Celesta Aver - treated initially with IV antibiotics, transitioned to oral antibiotics - continue supportive measures  Chronic alcohol abuse  - CIWA protocol - IV fluids and vitamins ordered   Acute pulmonary edema  - follow up Echo and chest xray Left ventricular ejection fraction, by estimation, is 60 to 65%. The left ventricle has normal function. The left ventricle has no regional wall motion abnormalities. Left ventricular diastolic parameters are consistent with Grade III diastolic dysfunction (restrictive). Elevated left atrial pressure.  Tobacco  - nicotine patch ordered   Hypotension  - wean off norepinephrine   - continue midodrine  LLE edema  - check venous doppler--->acute/chronic DVT - started heparin IV - transition to apixaban 1/6   - placed TED hose  DVT prophylaxis: IV heparin ---> apixaban  Code Status: full  Family Communication: none present  Disposition: anticipate DC home with home health in 24 hours if remains stable Status is: Inpatient  Remains inpatient appropriate because: IV pressor  support needed     Consultants:  Critical care - due to pressors, acute respiratory distress  Procedures:    Antimicrobials:  Levofloxacin 1/5>> Metronidazole 1/4>>  Subjective: Pt says he is wanting to go home.   No SOB, no CP.  No palpitations.    Objective: Vitals:   12/19/21 0530 12/19/21 0545 12/19/21 0600 12/19/21 0759  BP: (!) 79/59 (!) 75/49 (!) 82/57   Pulse: 78 (!) 46 77   Resp: 19 20 19    Temp:    98.1 F (36.7 C)  TempSrc:    Axillary   SpO2: 100% 99% 99%   Weight:      Height:        Intake/Output Summary (Last 24 hours) at 12/19/2021 1002 Last data filed at 12/19/2021 0600 Gross per 24 hour  Intake 1025.83 ml  Output 1475 ml  Net -449.17 ml   Filed Weights   12/15/21 1857 12/17/21 0900 12/18/21 0500  Weight: 56 kg 52.8 kg 56.3 kg    Examination:  General exam: thin, chronically ill appearing male,very anxious   Respiratory system: tachypneic, diminished BS LLL.  Cardiovascular system: normal S1 & S2 heard. No JVD, murmurs, rubs, gallops or clicks. No pedal edema. Gastrointestinal system: Abdomen is nondistended, soft and nontender. No organomegaly or masses felt. Normal bowel sounds heard. Central nervous system: Alert and oriented. No focal neurological deficits. Extremities: Symmetric 5 x 5 power. Skin: No rashes, lesions or ulcers Psychiatry: Judgement and insight appear poor. Mood & affect appropriate.   Data Reviewed: I have personally reviewed following labs and imaging studies  CBC: Recent Labs  Lab 12/15/21 1607 12/16/21 0343 12/17/21 0500 12/18/21 0429  WBC 16.1* 15.1* 19.4* 14.8*  NEUTROABS 11.8* 14.4* 17.1* 12.3*  HGB 10.4* 9.1* 10.1* 9.6*  HCT 29.9* 26.0* 28.1* 27.1*  MCV 103.8* 104.8* 102.9* 100.4*  PLT 135* 128* 153 129*    Basic Metabolic Panel: Recent Labs  Lab 12/15/21 1607 12/16/21 0343 12/17/21 0500 12/18/21 0429 12/19/21 0424  NA 131* 135 139 137 137  K 3.6 3.5 3.5 2.3* 2.8*  CL 98 107 111 110 111  CO2 24 21* 21* 19* 19*  GLUCOSE 110* 138* 99 81 79  BUN 15 14 10  6* 5*  CREATININE 1.07 0.85 0.76 0.55* 0.60*  CALCIUM 7.4* 7.4* 7.5* 6.9* 7.0*  MG  --  1.6* 1.5* 2.3 1.9    GFR: Estimated Creatinine Clearance: 71.4 mL/min (A) (by C-G formula based on SCr of 0.6 mg/dL (L)).  Liver Function Tests: Recent Labs  Lab 12/15/21 1607 12/16/21 0343 12/17/21 0500 12/18/21 0429  AST 36 32 30 36  ALT 29 23 24 23   ALKPHOS 98 72 74 75  BILITOT 1.5* 1.4* 1.1 0.8  PROT  5.0* 5.1* 4.8* 4.3*  ALBUMIN 1.5* 2.5* 2.1* 1.7*    CBG: No results for input(s): GLUCAP in the last 168 hours.  Recent Results (from the past 240 hour(s))  Resp Panel by RT-PCR (Flu A&B, Covid) Nasopharyngeal Swab     Status: None   Collection Time: 12/15/21  5:05 PM   Specimen: Nasopharyngeal Swab; Nasopharyngeal(NP) swabs in vial transport medium  Result Value Ref Range Status   SARS Coronavirus 2 by RT PCR NEGATIVE NEGATIVE Final    Comment: (NOTE) SARS-CoV-2 target nucleic acids are NOT DETECTED.  The SARS-CoV-2 RNA is generally detectable in upper respiratory specimens during the acute phase of infection. The lowest concentration of SARS-CoV-2 viral copies this assay can detect is 138 copies/mL. A negative result  does not preclude SARS-Cov-2 infection and should not be used as the sole basis for treatment or other patient management decisions. A negative result may occur with  improper specimen collection/handling, submission of specimen other than nasopharyngeal swab, presence of viral mutation(s) within the areas targeted by this assay, and inadequate number of viral copies(<138 copies/mL). A negative result must be combined with clinical observations, patient history, and epidemiological information. The expected result is Negative.  Fact Sheet for Patients:  EntrepreneurPulse.com.au  Fact Sheet for Healthcare Providers:  IncredibleEmployment.be  This test is no t yet approved or cleared by the [place] FDA and  has been authorized for detection and/or diagnosis of SARS-CoV-2 by FDA under an Emergency Use Authorization (EUA). This EUA will remain  in effect (meaning this test can be used) for the duration of the COVID-19 declaration under Section 564(b)(1) of the Act, 21 U.S.C.section 360bbb-3(b)(1), unless the authorization is terminated  or revoked sooner.       Influenza A by PCR NEGATIVE NEGATIVE Final   Influenza B by  PCR NEGATIVE NEGATIVE Final    Comment: (NOTE) The Xpert Xpress SARS-CoV-2/FLU/RSV plus assay is intended as an aid in the diagnosis of influenza from Nasopharyngeal swab specimens and should not be used as a sole basis for treatment. Nasal washings and aspirates are unacceptable for Xpert Xpress SARS-CoV-2/FLU/RSV testing.  Fact Sheet for Patients: EntrepreneurPulse.com.au  Fact Sheet for Healthcare Providers: IncredibleEmployment.be  This test is not yet approved or cleared by the [place] FDA and has been authorized for detection and/or diagnosis of SARS-CoV-2 by FDA under an Emergency Use Authorization (EUA). This EUA will remain in effect (meaning this test can be used) for the duration of the COVID-19 declaration under Section 564(b)(1) of the Act, 21 U.S.C. section 360bbb-3(b)(1), unless the authorization is terminated or revoked.  Performed at [facility], [address] [place], [place] [address]   Blood culture (routine x 2)     Status: None (Preliminary result)   Collection Time: 12/15/21  7:19 PM   Specimen: BLOOD LEFT HAND  Result Value Ref Range Status   Specimen Description BLOOD LEFT HAND  Final   Special Requests   Final    BOTTLES DRAWN AEROBIC AND ANAEROBIC Blood Culture results may not be optimal due to an inadequate volume of blood received in culture bottles   Culture   Final    NO GROWTH 4 DAYS Performed at [facility], [address], [place], [place] [address]    Report Status PENDING  Incomplete  Blood culture (routine x 2)     Status: None (Preliminary result)   Collection Time: 12/15/21  7:19 PM   Specimen: BLOOD RIGHT HAND  Result Value Ref Range Status   Specimen Description BLOOD RIGHT HAND  Final   Special Requests   Final    BOTTLES DRAWN AEROBIC ONLY Blood Culture results may not be optimal due to an inadequate volume of blood received in culture bottles   Culture   Final    NO GROWTH 4  DAYS Performed at [facility], [address], [place], [place] [address]    Report Status PENDING  Incomplete  Urine Culture     Status: None   Collection Time: 12/16/21  6:20 AM   Specimen: Urine, Clean Catch  Result Value Ref Range Status   Specimen Description   Final    URINE, CLEAN CATCH Performed at [facility], [address], [place], [place] [address]    Special Requests  Final    NONE Performed at [facility], [address], [place], [place] [address]    Culture   Final    NO GROWTH Performed at [facility], [place] [address], [place], [place] [address]    Report Status 12/17/2021 FINAL  Final  MRSA Next Gen by PCR, Nasal     Status: None   Collection Time: 12/17/21  9:07 AM   Specimen: Nasal Mucosa; Nasal Swab  Result Value Ref Range Status   MRSA by PCR Next Gen NOT DETECTED NOT DETECTED Final    Comment: (NOTE) The GeneXpert MRSA Assay (FDA approved for NASAL specimens only), is one component of a comprehensive MRSA colonization surveillance program. It is not intended to diagnose MRSA infection nor to guide or monitor treatment for MRSA infections. Test performance is not FDA approved in patients less than 19 years old. Performed at [facility], [address], [place], [place] [address]      Radiology Studies: [facility] Chest [facility] 1 View  Result Date: 12/18/2021 CLINICAL DATA:  Respiratory distress.  Alcohol abuse.  COPD. EXAM: PORTABLE CHEST 1 VIEW COMPARISON:  12/17/2021 FINDINGS: Two portable radiographs. Midline trachea. Normal heart size. Hyperinflation and interstitial thickening, consistent with COPD/chronic bronchitis. Numerous leads and wires project over the chest. Small right and trace left pleural effusions, increased. No pneumothorax. Interstitial edema is mild and progressive. Worsening right greater than left lower lung predominant airspace disease. IMPRESSION: Worsened aeration, with developing pulmonary edema and increasing pleural effusions,  superimposed upon COPD/chronic bronchitis. Right greater than left base airspace disease is increased. Likely atelectasis. Within the right lower lobe, pneumonia cannot be excluded. Electronically Signed   By: Abigail Miyamoto M.D.   On: 12/18/2021 08:18   DG CHEST PORT 1 VIEW  Result Date: 12/17/2021 CLINICAL DATA:  Hypoxia EXAM: PORTABLE CHEST 1 VIEW COMPARISON:  Chest x-ray earlier the same day, CT chest 12/15/2021 FINDINGS: Heart size is normal. Mediastinum is stable. Hyperinflated lungs with chronic interstitial emphysematous changes. Hazy opacity in the right lower lung zone consistent with small layering pleural effusion. Mild opacities at the left lung base which may represent subsegmental atelectasis or early infiltrate. No pneumothorax visualized. IMPRESSION: 1. Mild opacities at the left lung base which may represent subsegmental atelectasis or early infiltrate. 2. Small layering right pleural effusion. 3. Emphysema. Electronically Signed   By: Ofilia Neas M.D.   On: 12/17/2021 10:27    Scheduled Meds:  apixaban  10 mg Oral BID   Followed by   Derrill Memo ON 12/25/2021] apixaban  5 mg Oral BID   Chlorhexidine Gluconate Cloth  6 each Topical Daily   feeding supplement  237 mL Oral BID BM   ferrous sulfate  325 mg Oral Q breakfast   folic acid  1 mg Oral Daily   levofloxacin  750 mg Oral Daily   metroNIDAZOLE  500 mg Oral Q12H   midodrine  10 mg Oral TID WC   mirtazapine  15 mg Oral QHS   multivitamin with minerals  1 tablet Oral Daily   nicotine  21 mg Transdermal Daily   pneumococcal 23 valent vaccine  0.5 mL Intramuscular Tomorrow-1000   QUEtiapine  25 mg Oral QHS   thiamine  100 mg Oral Daily   Or   thiamine  100 mg Intravenous Daily   umeclidinium bromide  1 puff Inhalation Daily   Continuous Infusions:  sodium chloride 10 mL/hr at 12/18/21 1448   sodium chloride     magnesium sulfate  bolus IVPB     potassium chloride       LOS: 4 days   Time spent 33 mins   Amunique Neyra  Wynetta Emery, MD How to contact the [facility] Attending or Consulting provider [place] or covering provider during after hours [place], for this patient?  Check the care team in [facility] and look for a) attending/consulting TRH provider listed and b) the [facility] team listed Log into [email or web address] and use [place]'s universal password to access. If you do not have the password, please contact the hospital operator. Locate the [facility] provider you are looking for under [facility] and page to a number that you can be directly reached. If you still have difficulty reaching the provider, please page the [facility] (Director on Call) for the Hospitalists listed on amion for assistance.  12/19/2021, 10:02 AM

## 2021-12-20 DIAGNOSIS — E860 Dehydration: Secondary | ICD-10-CM | POA: Diagnosis not present

## 2021-12-20 DIAGNOSIS — A419 Sepsis, unspecified organism: Secondary | ICD-10-CM | POA: Diagnosis not present

## 2021-12-20 DIAGNOSIS — K529 Noninfective gastroenteritis and colitis, unspecified: Secondary | ICD-10-CM | POA: Diagnosis not present

## 2021-12-20 DIAGNOSIS — F1010 Alcohol abuse, uncomplicated: Secondary | ICD-10-CM | POA: Diagnosis not present

## 2021-12-20 LAB — CULTURE, BLOOD (ROUTINE X 2)
Culture: NO GROWTH
Culture: NO GROWTH

## 2021-12-20 MED ORDER — LEVOFLOXACIN 750 MG PO TABS: 750.0000 mg | ORAL_TABLET | Freq: Every day | ORAL | 0 refills | Status: DC

## 2021-12-20 MED ORDER — APIXABAN 5 MG PO TABS: ORAL_TABLET | ORAL | 1 refills | Status: AC

## 2021-12-20 MED ORDER — POTASSIUM CHLORIDE 20 MEQ PO PACK
40.0000 meq | PACK | Freq: Once | ORAL | Status: DC
  Filled 2021-12-20: qty 2

## 2021-12-20 MED ORDER — ALBUTEROL SULFATE HFA 108 (90 BASE) MCG/ACT IN AERS: 2.0000 | INHALATION_SPRAY | Freq: Four times a day (QID) | RESPIRATORY_TRACT | 2 refills | Status: AC | PRN

## 2021-12-20 MED ORDER — MIDODRINE HCL 10 MG PO TABS: 10.0000 mg | ORAL_TABLET | Freq: Three times a day (TID) | ORAL | 2 refills | Status: AC

## 2021-12-20 MED ORDER — METRONIDAZOLE 500 MG PO TABS
500.0000 mg | ORAL_TABLET | Freq: Two times a day (BID) | ORAL | 0 refills | Status: AC
Start: 2021-12-20 — End: 2021-12-26

## 2021-12-20 MED ORDER — POTASSIUM CHLORIDE 20 MEQ PO PACK
20.0000 meq | PACK | Freq: Once | ORAL | Status: AC
  Administered 2021-12-20: 20 meq via ORAL

## 2021-12-20 MED ORDER — MAGNESIUM SULFATE 2 GM/50ML IV SOLN
2.0000 g | Freq: Once | INTRAVENOUS | Status: AC
  Administered 2021-12-20: 2 g via INTRAVENOUS
  Filled 2021-12-20: qty 50

## 2021-12-20 MED ORDER — FOLIC ACID 1 MG PO TABS: 1.0000 mg | ORAL_TABLET | Freq: Every day | ORAL | 1 refills | Status: AC

## 2021-12-20 MED ORDER — ENSURE ENLIVE PO LIQD: 1.0000 | Freq: Two times a day (BID) | ORAL | 1 refills | Status: AC

## 2021-12-20 MED ORDER — THIAMINE HCL 100 MG PO TABS: 100.0000 mg | ORAL_TABLET | Freq: Every day | ORAL | 1 refills | Status: AC

## 2021-12-20 NOTE — Discharge Summary (Signed)
Physician Discharge Summary  Spencer Hickman [id number] DOB: 05-01-1956 DOA: 12/15/2021  PCP: Neale Burly, MD  Admit date: 12/15/2021 Discharge date: 12/20/2021  Admitted From: Home  Disposition: Home with [facility]   Recommendations for Outpatient Follow-up:  Follow up with PCP in 3-5 days for recheck  Bleeding precautions while on apixaban Discuss length of treatment for DVT with PCP Outpatient alcohol treatment program recommended  Please obtain BMP/CBC in one week to follow up electrolytes, hemoglobin  Home Health: PT, RN   Discharge Condition: Stable but guarded   CODE STATUS: FULL DIET: dysphagia 3 soft mechanical    Brief Hospitalization Summary: Please see all hospital notes, images, labs for full details of the hospitalization. ADMISSION HPI:   Spencer Hickman  is a 66 y.o. male, with history of alcohol abuse, COPD, tobacco use disorder, coronary artery disease, hypertension and more presents ED with chief complaint of diarrhea.  Patient reports he had diarrhea intermittently for 2 to 3 weeks.  This current episode has been lasting for 3 to 5 days.  He reports some bowel incontinence.  He has associated dizziness upon standing, weakness, fatigue.  He reports his episodes of diarrhea are 2+ times per day.  They are nonbloody.  His stool is black but it has been ever since he started taking iron.  He denies any tarry stools.  He reports the stools are malodorous.  He has had no fever, no dysuria, no cough, no rashes.  He reports that today he came in because home health took his blood pressures that he was hypotensive, he had had some bowel incontinence, so they sent him into the ED.  Patient reports he did take his medications today including Cardizem.  He has not had any pain.  He has not had any emesis.  He reports that he has felt fine.  No recent antibiotic use.  No recent travel.  Patient reports that he is barely been out of his home for the last 3 years secondary to pandemic.  He  has no history of C. difficile.  Patient does report a decrease in p.o. intake secondary to decrease in appetite.  He reports significant weight loss, possibly 60 pounds.  He was drinking a 12 pack of beer per day, but due to this decreased appetite he has only been drinking 2-4 beers per day.  He has barely any food intake.  Patient has no other complaints at this time.   Patient smokes a pack and a half a day.  He drinks alcohol.  He uses marijuana.  His last drink was yesterday, his last marijuana use was yesterday.  He does not use cocaine or heroin.  He is not vaccinated for COVID.  Patient is full code.   In the ED Temp 97.6, heart rate 60-90, respiratory rate 9-23, blood pressure 60/40, satting 84-mid 90s on room air Patient's blood pressures have been consistently in the 60s over 40s, except for one manual pressure which was 90/50.  Patient is mentating fine.  He is completely asymptomatic.  The validity of these blood pressures is questionable. Patient is a leukocytosis 16.1, hemoglobin 10.4 Very slight hyponatremia given the alcohol abuse, sodium 131 Creatinine has doubled over the last 2 years from 0.55, now 1.07 Pseudo hypocalcemia hypocalcemia that corrects for albumin to 9.4 Albumin 1.5 BNP 271 Lactic acid initially 2.2, then 2.4 Respiratory panel negative CT chest shows no pulmonary embolus.  Mild pulmonary edema.  Trace right pleural effusion CT abdomen shows pancolitis D-dimer  elevated 1.45 EKG shows sinus rhythm with a heart rate of 67, QTc 458 Patient was tested for C. difficile, started on vancomycin and cefepime Blood culture pending 12.5 g albumin ordered Admission requested for sepsis  HOSPITAL COURSE BY PROBLEM LIST    Sepsis secondary to pancolitis - RESOLVED  - TREATED WITH IV antibiotics and supportive measures  - trend lactic acid to resolution - IV fluid hydration - follow cultures: no growth to date    Acute respiratory failure with hypoxia  - secondary  to acute aspiration  - initially was planning to intubate emergently - fortunately patient recovered shortly afterwards  - discussed with pulmonologist Dr. Marcie Bal - treated initially with IV antibiotics, transitioned to oral antibiotics - continue supportive measures - complete course of oral antibiotics prescribed at dc   Chronic  hypotension - asymptomatic - treated with midodrine 10 mg TID    Chronic alcohol abuse  - CIWA protocol - IV fluids and vitamins ordered    Acute pulmonary edema  - follow up Echo and chest xray Left ventricular ejection fraction, by estimation, is 60 to 65%. The left ventricle has normal function. The left ventricle has no regional wall motion abnormalities. Left ventricular diastolic parameters are consistent with Grade III diastolic dysfunction (restrictive). Elevated left atrial pressure.   Tobacco  - nicotine patch ordered    Hypotension  - wean off norepinephrine   - continue midodrine   LLE edema/Acute /chronic DVT LLE   - check venous doppler--->acute/chronic DVT - started heparin IV - transitioned to apixaban 1/6   - placed TED hose - pharm D counseled patient   Hypokalemia / Hypomagnesemia  - REPLETED    DVT prophylaxis: IV heparin ---> apixaban  Code Status: full  Family Communication: none present  Disposition: home with [facility]   Discharge Diagnoses:  Principal Problem:   Sepsis (HCC) Active Problems:   Tobacco abuse   Protein-calorie malnutrition, severe   Protein calorie malnutrition (HCC)   Alcohol abuse   Colitis   Dehydration   Discharge Instructions:  Allergies as of 12/20/2021   No Known Allergies      Medication List     STOP taking these medications    diltiazem 120 MG 24 hr capsule Commonly known as: CARDIZEM CD   metoprolol succinate 50 MG 24 hr tablet Commonly known as: TOPROL-XL       TAKE these medications    albuterol 108 (90 Base) MCG/ACT inhaler Commonly known as: VENTOLIN  HFA Inhale 2 puffs into the lungs every 6 (six) hours as needed for shortness of breath.   apixaban 5 MG Tabs tablet Commonly known as: ELIQUIS 2 po bid thru 1/12, then 1 po bid   dextromethorphan 30 MG/5ML liquid Commonly known as: DELSYM Take 5 mLs (30 mg total) by mouth 2 (two) times daily as needed for cough.   feeding supplement Liqd Take 237 mLs by mouth 2 (two) times daily between meals.   ferrous sulfate 325 (65 FE) MG tablet Take 1 tablet (325 mg total) by mouth daily with breakfast.   folic acid 1 MG tablet Commonly known as: FOLVITE Take 1 tablet (1 mg total) by mouth daily.   levofloxacin 750 MG tablet Commonly known as: LEVAQUIN Take 1 tablet (750 mg total) by mouth daily. Start taking on: December 21, 2021   metroNIDAZOLE 500 MG tablet Commonly known as: FLAGYL Take 1 tablet (500 mg total) by mouth every 12 (twelve) hours for 6 days.  midodrine 10 MG tablet Commonly known as: PROAMATINE Take 1 tablet (10 mg total) by mouth 3 (three) times daily with meals.   mirtazapine 15 MG tablet Commonly known as: REMERON Take 15 mg by mouth at bedtime.   multivitamin with minerals Tabs tablet Take 1 tablet by mouth daily.   omeprazole 20 MG capsule Commonly known as: PRILOSEC Take 20 mg by mouth daily.   phenol 1.4 % Liqd Commonly known as: CHLORASEPTIC Use as directed 1 spray in the mouth or throat as needed for throat irritation / pain.   QUEtiapine 25 MG tablet Commonly known as: SEROQUEL Take 25 mg by mouth at bedtime.   Spiriva HandiHaler 18 MCG inhalation capsule Generic drug: tiotropium Place 18 mcg into inhaler and inhale daily.   thiamine 100 MG tablet Take 1 tablet (100 mg total) by mouth daily.        Follow-up Information     [facility].   Specialty: Home Health Services Why: PT will all to schedule your first home visit.        Neale Burly, MD. Schedule an appointment as soon as possible for a  visit in 5 day(s).   Specialty: Internal Medicine Why: Hospital Follow Up Contact information: [address] [place] [id number] [phone]                No Known Allergies Allergies as of 12/20/2021   No Known Allergies      Medication List     STOP taking these medications    diltiazem 120 MG 24 hr capsule Commonly known as: CARDIZEM CD   metoprolol succinate 50 MG 24 hr tablet Commonly known as: TOPROL-XL       TAKE these medications    albuterol 108 (90 Base) MCG/ACT inhaler Commonly known as: VENTOLIN HFA Inhale 2 puffs into the lungs every 6 (six) hours as needed for shortness of breath.   apixaban 5 MG Tabs tablet Commonly known as: ELIQUIS 2 po bid thru 1/12, then 1 po bid   dextromethorphan 30 MG/5ML liquid Commonly known as: DELSYM Take 5 mLs (30 mg total) by mouth 2 (two) times daily as needed for cough.   feeding supplement Liqd Take 237 mLs by mouth 2 (two) times daily between meals.   ferrous sulfate 325 (65 FE) MG tablet Take 1 tablet (325 mg total) by mouth daily with breakfast.   folic acid 1 MG tablet Commonly known as: FOLVITE Take 1 tablet (1 mg total) by mouth daily.   levofloxacin 750 MG tablet Commonly known as: LEVAQUIN Take 1 tablet (750 mg total) by mouth daily. Start taking on: December 21, 2021   metroNIDAZOLE 500 MG tablet Commonly known as: FLAGYL Take 1 tablet (500 mg total) by mouth every 12 (twelve) hours for 6 days.   midodrine 10 MG tablet Commonly known as: PROAMATINE Take 1 tablet (10 mg total) by mouth 3 (three) times daily with meals.   mirtazapine 15 MG tablet Commonly known as: REMERON Take 15 mg by mouth at bedtime.   multivitamin with minerals Tabs tablet Take 1 tablet by mouth daily.   omeprazole 20 MG capsule Commonly known as: PRILOSEC Take 20 mg by mouth daily.   phenol 1.4 % Liqd Commonly known as: CHLORASEPTIC Use as directed 1 spray in the mouth or throat as needed for throat  irritation / pain.   QUEtiapine 25 MG tablet Commonly known as: SEROQUEL Take 25 mg by mouth at  bedtime.   Spiriva HandiHaler 18 MCG inhalation capsule Generic drug: tiotropium Place 18 mcg into inhaler and inhale daily.   thiamine 100 MG tablet Take 1 tablet (100 mg total) by mouth daily.        Procedures/Studies: CT Angio Chest PE W and/or Wo Contrast  Result Date: 12/15/2021 CLINICAL DATA:  Pulmonary embolism (PE) suspected, positive D-dimer; Abdominal pain, acute, nonlocalized EXAM: CT ANGIOGRAPHY CHEST CT ABDOMEN AND PELVIS WITH CONTRAST TECHNIQUE: Multidetector CT imaging of the chest was performed using the standard protocol during bolus administration of intravenous contrast. Multiplanar CT image reconstructions and MIPs were obtained to evaluate the vascular anatomy. Multidetector CT imaging of the abdomen and pelvis was performed using the standard protocol during bolus administration of intravenous contrast. CONTRAST:  51mL OMNIPAQUE IOHEXOL 350 MG/ML SOLN COMPARISON:  None. FINDINGS: CTA CHEST FINDINGS Cardiovascular: Satisfactory opacification of the pulmonary arteries to the segmental level. No evidence of pulmonary embolism. Normal heart size. No significant pericardial effusion. The thoracic aorta is normal in caliber. Mild-to-moderate atherosclerotic plaque of the thoracic aorta. Aortic valve leaflet calcifications. Three vessel coronary artery calcifications. Mediastinum/Nodes: No enlarged mediastinal, hilar, or axillary lymph nodes. Thyroid gland, trachea, and esophagus demonstrate no significant findings. Trace debris within the trachea. Lungs/Pleura: Mild interlobular septal wall thickening most prominent within bilateral upper lobes. Diffuse at least moderate centrilobular emphysematous changes. No focal consolidation. Few scattered pulmonary micronodules. No pulmonary mass. No pleural effusion. No pneumothorax. Musculoskeletal: No chest wall abnormality. No suspicious lytic  or blastic osseous lesions. No acute displaced fracture. Multilevel degenerative changes of the spine. Review of the MIP images confirms the above findings. CT ABDOMEN and PELVIS FINDINGS Hepatobiliary: No focal liver abnormality. No gallstones, gallbladder wall thickening, or pericholecystic fluid. No biliary dilatation. Pancreas: No focal lesion. Normal pancreatic contour. No surrounding inflammatory changes. No main pancreatic ductal dilatation. Spleen: Normal in size without focal abnormality. Adrenals/Urinary Tract: No adrenal nodule bilaterally. Bilateral kidneys enhance symmetrically. No hydronephrosis. No hydroureter. The urinary bladder is unremarkable. Stomach/Bowel: Stomach is within normal limits. No evidence of small bowel wall thickening or dilatation. Diffuse bowel wall thickening of the colon with associated mucosal hyperemia and trace pericolonic fat stranding. Fluid density noted within the lumen of the large bowel. Appendix appears normal. Vascular/Lymphatic: No abdominal aorta or iliac aneurysm. Severe atherosclerotic plaque of the aorta and its branches. No abdominal, pelvic, or inguinal lymphadenopathy. Reproductive: Prostate is unremarkable. Other: Trace volume free fluid within the pelvis. No intraperitoneal free gas. No organized fluid collection. Musculoskeletal: No abdominal wall hernia or abnormality. No suspicious lytic or blastic osseous lesions. No acute displaced fracture. Multilevel degenerative changes of the spine. Review of the MIP images confirms the above findings. IMPRESSION: 1. No pulmonary embolus. 2. Mild pulmonary edema. 3. Trace right pleural effusion. 4. Pancolitis. 5. Aortic Atherosclerosis (ICD10-I70.0) and Emphysema (ICD10-J43.9). 6. Few scattered pulmonary micronodules. No follow-up needed if patient is low-risk (and has no known or suspected primary neoplasm). Non-contrast chest CT can be considered in 12 months if patient is high-risk. This recommendation follows  the consensus statement: Guidelines for Management of Incidental Pulmonary Nodules Detected on CT Images: From the Fleischner Society 2017; Radiology 2017; 284:228-243. Electronically Signed   By: Iven Finn M.D.   On: 12/15/2021 18:52   CT ABDOMEN PELVIS W CONTRAST  Result Date: 12/15/2021 CLINICAL DATA:  Pulmonary embolism (PE) suspected, positive D-dimer; Abdominal pain, acute, nonlocalized EXAM: CT ANGIOGRAPHY CHEST CT ABDOMEN AND PELVIS WITH CONTRAST TECHNIQUE: Multidetector CT imaging of the chest was  performed using the standard protocol during bolus administration of intravenous contrast. Multiplanar CT image reconstructions and MIPs were obtained to evaluate the vascular anatomy. Multidetector CT imaging of the abdomen and pelvis was performed using the standard protocol during bolus administration of intravenous contrast. CONTRAST:  58mL OMNIPAQUE IOHEXOL 350 MG/ML SOLN COMPARISON:  None. FINDINGS: CTA CHEST FINDINGS Cardiovascular: Satisfactory opacification of the pulmonary arteries to the segmental level. No evidence of pulmonary embolism. Normal heart size. No significant pericardial effusion. The thoracic aorta is normal in caliber. Mild-to-moderate atherosclerotic plaque of the thoracic aorta. Aortic valve leaflet calcifications. Three vessel coronary artery calcifications. Mediastinum/Nodes: No enlarged mediastinal, hilar, or axillary lymph nodes. Thyroid gland, trachea, and esophagus demonstrate no significant findings. Trace debris within the trachea. Lungs/Pleura: Mild interlobular septal wall thickening most prominent within bilateral upper lobes. Diffuse at least moderate centrilobular emphysematous changes. No focal consolidation. Few scattered pulmonary micronodules. No pulmonary mass. No pleural effusion. No pneumothorax. Musculoskeletal: No chest wall abnormality. No suspicious lytic or blastic osseous lesions. No acute displaced fracture. Multilevel degenerative changes of the  spine. Review of the MIP images confirms the above findings. CT ABDOMEN and PELVIS FINDINGS Hepatobiliary: No focal liver abnormality. No gallstones, gallbladder wall thickening, or pericholecystic fluid. No biliary dilatation. Pancreas: No focal lesion. Normal pancreatic contour. No surrounding inflammatory changes. No main pancreatic ductal dilatation. Spleen: Normal in size without focal abnormality. Adrenals/Urinary Tract: No adrenal nodule bilaterally. Bilateral kidneys enhance symmetrically. No hydronephrosis. No hydroureter. The urinary bladder is unremarkable. Stomach/Bowel: Stomach is within normal limits. No evidence of small bowel wall thickening or dilatation. Diffuse bowel wall thickening of the colon with associated mucosal hyperemia and trace pericolonic fat stranding. Fluid density noted within the lumen of the large bowel. Appendix appears normal. Vascular/Lymphatic: No abdominal aorta or iliac aneurysm. Severe atherosclerotic plaque of the aorta and its branches. No abdominal, pelvic, or inguinal lymphadenopathy. Reproductive: Prostate is unremarkable. Other: Trace volume free fluid within the pelvis. No intraperitoneal free gas. No organized fluid collection. Musculoskeletal: No abdominal wall hernia or abnormality. No suspicious lytic or blastic osseous lesions. No acute displaced fracture. Multilevel degenerative changes of the spine. Review of the MIP images confirms the above findings. IMPRESSION: 1. No pulmonary embolus. 2. Mild pulmonary edema. 3. Trace right pleural effusion. 4. Pancolitis. 5. Aortic Atherosclerosis (ICD10-I70.0) and Emphysema (ICD10-J43.9). 6. Few scattered pulmonary micronodules. No follow-up needed if patient is low-risk (and has no known or suspected primary neoplasm). Non-contrast chest CT can be considered in 12 months if patient is high-risk. This recommendation follows the consensus statement: Guidelines for Management of Incidental Pulmonary Nodules Detected on CT  Images: From the Fleischner Society 2017; Radiology 2017; 284:228-243. Electronically Signed   By: Iven Finn M.D.   On: 12/15/2021 18:52   US Venous Img Lower Unilateral Left (DVT)  Result Date: 12/16/2021 CLINICAL DATA:  Pain and swelling, positive D-dimer EXAM: Left LOWER EXTREMITY VENOUS DOPPLER ULTRASOUND TECHNIQUE: Gray-scale sonography with compression, as well as color and duplex ultrasound, were performed to evaluate the deep venous system(s) from the level of the common femoral vein through the popliteal and proximal calf veins. COMPARISON:  None. FINDINGS: VENOUS Findings suggest acute or chronic DVT in the left peroneal and posterior tibial veins. There is no evidence of acute DVT in the left common femoral, femoral and popliteal veins. Limited views of the contralateral common femoral vein are unremarkable. OTHER None. Limitations: none IMPRESSION: Findings suggest acute or chronic DVT in the left peroneal and posterior tibial veins. Electronically  Signed   By: Elmer Picker M.D.   On: 12/16/2021 09:29   DG Chest Port 1 View  Result Date: 12/18/2021 CLINICAL DATA:  Respiratory distress.  Alcohol abuse.  COPD. EXAM: PORTABLE CHEST 1 VIEW COMPARISON:  12/17/2021 FINDINGS: Two portable radiographs. Midline trachea. Normal heart size. Hyperinflation and interstitial thickening, consistent with COPD/chronic bronchitis. Numerous leads and wires project over the chest. Small right and trace left pleural effusions, increased. No pneumothorax. Interstitial edema is mild and progressive. Worsening right greater than left lower lung predominant airspace disease. IMPRESSION: Worsened aeration, with developing pulmonary edema and increasing pleural effusions, superimposed upon COPD/chronic bronchitis. Right greater than left base airspace disease is increased. Likely atelectasis. Within the right lower lobe, pneumonia cannot be excluded. Electronically Signed   By: Abigail Miyamoto M.D.   On: 12/18/2021  08:18   DG CHEST PORT 1 VIEW  Result Date: 12/17/2021 CLINICAL DATA:  Hypoxia EXAM: PORTABLE CHEST 1 VIEW COMPARISON:  Chest x-ray earlier the same day, CT chest 12/15/2021 FINDINGS: Heart size is normal. Mediastinum is stable. Hyperinflated lungs with chronic interstitial emphysematous changes. Hazy opacity in the right lower lung zone consistent with small layering pleural effusion. Mild opacities at the left lung base which may represent subsegmental atelectasis or early infiltrate. No pneumothorax visualized. IMPRESSION: 1. Mild opacities at the left lung base which may represent subsegmental atelectasis or early infiltrate. 2. Small layering right pleural effusion. 3. Emphysema. Electronically Signed   By: Ofilia Neas M.D.   On: 12/17/2021 10:27   DG CHEST PORT 1 VIEW  Result Date: 12/17/2021 CLINICAL DATA:  65 year old male with sepsis. EXAM: PORTABLE CHEST 1 VIEW COMPARISON:  CTA chest 12/15/2021 and earlier. FINDINGS: Portable AP upright view at 0503 hours. Increased veiling opacity in the right lower lung from the recent CTA. Underlying large lung volumes and emphysema redemonstrated. Normal cardiac size and mediastinal contours. No pneumothorax. No acute osseous abnormality identified. Negative visible bowel gas. IMPRESSION: 1. Evidence of increasing right pleural effusion since recent CTA, although likely still small volume. 2.  Emphysema (ICD10-J43.9). Electronically Signed   By: Genevie Ann M.D.   On: 12/17/2021 05:19   ECHOCARDIOGRAM COMPLETE  Result Date: 12/16/2021    ECHOCARDIOGRAM REPORT   Patient Name:   JUVENS MATTON [employer] Date of Exam: 12/16/2021 Medical Rec #:  [id number]         Height:       66.0 in Accession #:    [id number]        Weight:       123.5 lb Date of Birth:  1956/04/23         BSA:          1.629 m Patient Age:    28 years          BP:           79/57 mmHg Patient Gender: M                 HR:           74 bpm. Exam Location:  [facility] Procedure: 2D Echo, Cardiac Doppler  and Color Doppler Indications:    Pulmonary Edema  History:        Patient has prior history of Echocardiogram examinations, most                 recent 02/05/2019. CAD, COPD; Signs/Symptoms:Chest Pain and  Shortness of Breath. Tobacco and ETOH abuse.  Sonographer:    Wenda Low Referring Phys: [id number] ASIA B [place]  Sonographer Comments: Technically difficult study due to poor echo windows and suboptimal apical window. Image acquisition challenging due to patient body habitus. IMPRESSIONS  1. Left ventricular ejection fraction, by estimation, is 60 to 65%. The left ventricle has normal function. The left ventricle has no regional wall motion abnormalities. Left ventricular diastolic parameters are consistent with Grade III diastolic dysfunction (restrictive). Elevated left atrial pressure.  2. Right ventricular systolic function is normal. The right ventricular size is normal. Tricuspid regurgitation signal is inadequate for assessing PA pressure.  3. The mitral valve is normal in structure. No evidence of mitral valve regurgitation. No evidence of mitral stenosis.  4. The aortic valve was not well visualized. There is moderate calcification of the aortic valve. There is moderate thickening of the aortic valve. Aortic valve regurgitation is not visualized. No aortic stenosis is present.  5. The inferior vena cava is normal in size with greater than 50% respiratory variability, suggesting right atrial pressure of 3 mmHg. FINDINGS  Left Ventricle: Left ventricular ejection fraction, by estimation, is 60 to 65%. The left ventricle has normal function. The left ventricle has no regional wall motion abnormalities. The left ventricular internal cavity size was normal in size. There is  no left ventricular hypertrophy. Left ventricular diastolic parameters are consistent with Grade III diastolic dysfunction (restrictive). Elevated left atrial pressure. Right Ventricle: The right ventricular size  is normal. No increase in right ventricular wall thickness. Right ventricular systolic function is normal. Tricuspid regurgitation signal is inadequate for assessing PA pressure. Left Atrium: Left atrial size was not well visualized. Right Atrium: Right atrial size was not well visualized. Pericardium: There is no evidence of pericardial effusion. Mitral Valve: The mitral valve is normal in structure. No evidence of mitral valve regurgitation. No evidence of mitral valve stenosis. MV peak gradient, 5.5 mmHg. The mean mitral valve gradient is 1.0 mmHg. Tricuspid Valve: The tricuspid valve is not well visualized. Tricuspid valve regurgitation is mild . No evidence of tricuspid stenosis. Aortic Valve: The aortic valve was not well visualized. There is moderate calcification of the aortic valve. There is moderate thickening of the aortic valve. There is moderate aortic valve annular calcification. Aortic valve regurgitation is not visualized. No aortic stenosis is present. Aortic valve mean gradient measures 1.0 mmHg. Aortic valve peak gradient measures 2.4 mmHg. Aortic valve area, by VTI measures 2.66 cm. Pulmonic Valve: The pulmonic valve was not well visualized. Pulmonic valve regurgitation is not visualized. No evidence of pulmonic stenosis. Aorta: The aortic root is normal in size and structure. Venous: The inferior vena cava is normal in size with greater than 50% respiratory variability, suggesting right atrial pressure of 3 mmHg. IAS/Shunts: No atrial level shunt detected by color flow Doppler.  LEFT VENTRICLE PLAX 2D LVIDd:         3.30 cm   Diastology LVIDs:         2.30 cm   LV e' medial:    6.74 cm/s LV PW:         0.80 cm   LV E/e' medial:  17.4 LV IVS:        0.80 cm   LV e' lateral:   8.05 cm/s LVOT diam:     2.00 cm   LV E/e' lateral: 14.5 LV SV:         48 LV SV Index:   30 LVOT  Area:     3.14 cm  LEFT ATRIUM           Index LA diam:      3.30 cm 2.03 cm/m LA Vol (A4C): 38.5 ml 23.63 ml/m  AORTIC  VALVE AV Area (Vmax):    2.18 cm AV Area (Vmean):   2.70 cm AV Area (VTI):     2.66 cm AV Vmax:           78.20 cm/s AV Vmean:          41.000 cm/s AV VTI:            0.181 m AV Peak Grad:      2.4 mmHg AV Mean Grad:      1.0 mmHg LVOT Vmax:         54.20 cm/s LVOT Vmean:        35.200 cm/s LVOT VTI:          0.153 m LVOT/AV VTI ratio: 0.85  AORTA Ao Root diam: 3.30 cm Ao Asc diam:  3.80 cm MITRAL VALVE MV Area (PHT): 7.16 cm     SHUNTS MV Area VTI:   2.10 cm     Systemic VTI:  0.15 m MV Peak grad:  5.5 mmHg     Systemic Diam: 2.00 cm MV Mean grad:  1.0 mmHg MV Vmax:       1.17 m/s MV Vmean:      36.9 cm/s MV Decel Time: 106 msec MV E velocity: 117.00 cm/s MV A velocity: 33.00 cm/s MV E/A ratio:  3.55 Carlyle Dolly MD Electronically signed by Carlyle Dolly MD Signature Date/Time: 12/16/2021/1:07:47 PM    Final      Subjective: Pt reports that he wants to go home today.  He feels well and he says he has assistance at home and can pick up his prescriptions.    Discharge Exam: Vitals:   12/20/21 0759 12/20/21 0800  BP: (!) 78/55 (!) 78/55  Pulse: 85 82  Resp: 18 18  Temp: 97.9 F (36.6 C)   SpO2: 97% 97%   Vitals:   12/20/21 0600 12/20/21 0700 12/20/21 0759 12/20/21 0800  BP: (!) 71/48 (!) 84/60 (!) 78/55 (!) 78/55  Pulse: 96 (!) 108 85 82  Resp: 17 (!) 21 18 18   Temp:   97.9 F (36.6 C)   TempSrc:   Oral   SpO2: 98% 96% 97% 97%  Weight:      Height:       General exam: thin, chronically ill appearing male, NAD appears comfortable.    Respiratory system: BBS clear to auscultation.   Cardiovascular system: normal S1 & S2 heard. No JVD, murmurs, rubs, gallops or clicks. No pedal edema. Gastrointestinal system: Abdomen is nondistended, soft and nontender. No organomegaly or masses felt. Normal bowel sounds heard. Central nervous system: Alert and oriented. No focal neurological deficits. Extremities: Symmetric 5 x 5 power. Skin: No rashes, lesions or ulcers Psychiatry: he seems to  have decisional capacity today. Mood & affect appropriate.    The results of significant diagnostics from this hospitalization (including imaging, microbiology, ancillary and laboratory) are listed below for reference.     Microbiology: Recent Results (from the past 240 hour(s))  Resp Panel by RT-PCR (Flu A&B, Covid) Nasopharyngeal Swab     Status: None   Collection Time: 12/15/21  5:05 PM   Specimen: Nasopharyngeal Swab; Nasopharyngeal(NP) swabs in vial transport medium  Result Value Ref Range Status   SARS Coronavirus 2 by RT  PCR NEGATIVE NEGATIVE Final    Comment: (NOTE) SARS-CoV-2 target nucleic acids are NOT DETECTED.  The SARS-CoV-2 RNA is generally detectable in upper respiratory specimens during the acute phase of infection. The lowest concentration of SARS-CoV-2 viral copies this assay can detect is 138 copies/mL. A negative result does not preclude SARS-Cov-2 infection and should not be used as the sole basis for treatment or other patient management decisions. A negative result may occur with  improper specimen collection/handling, submission of specimen other than nasopharyngeal swab, presence of viral mutation(s) within the areas targeted by this assay, and inadequate number of viral copies(<138 copies/mL). A negative result must be combined with clinical observations, patient history, and epidemiological information. The expected result is Negative.  Fact Sheet for Patients:  EntrepreneurPulse.com.au  Fact Sheet for Healthcare Providers:  IncredibleEmployment.be  This test is no t yet approved or cleared by the [place] FDA and  has been authorized for detection and/or diagnosis of SARS-CoV-2 by FDA under an Emergency Use Authorization (EUA). This EUA will remain  in effect (meaning this test can be used) for the duration of the COVID-19 declaration under Section 564(b)(1) of the Act, 21 U.S.C.section 360bbb-3(b)(1), unless  the authorization is terminated  or revoked sooner.       Influenza A by PCR NEGATIVE NEGATIVE Final   Influenza B by PCR NEGATIVE NEGATIVE Final    Comment: (NOTE) The Xpert Xpress SARS-CoV-2/FLU/RSV plus assay is intended as an aid in the diagnosis of influenza from Nasopharyngeal swab specimens and should not be used as a sole basis for treatment. Nasal washings and aspirates are unacceptable for Xpert Xpress SARS-CoV-2/FLU/RSV testing.  Fact Sheet for Patients: EntrepreneurPulse.com.au  Fact Sheet for Healthcare Providers: IncredibleEmployment.be  This test is not yet approved or cleared by the [place] FDA and has been authorized for detection and/or diagnosis of SARS-CoV-2 by FDA under an Emergency Use Authorization (EUA). This EUA will remain in effect (meaning this test can be used) for the duration of the COVID-19 declaration under Section 564(b)(1) of the Act, 21 U.S.C. section 360bbb-3(b)(1), unless the authorization is terminated or revoked.  Performed at [facility], [address], [place], [place] [address]   Blood culture (routine x 2)     Status: None (Preliminary result)   Collection Time: 12/15/21  7:19 PM   Specimen: BLOOD LEFT HAND  Result Value Ref Range Status   Specimen Description BLOOD LEFT HAND  Final   Special Requests   Final    BOTTLES DRAWN AEROBIC AND ANAEROBIC Blood Culture results may not be optimal due to an inadequate volume of blood received in culture bottles   Culture   Final    NO GROWTH 4 DAYS Performed at [facility], [address], [place], [place] [address]    Report Status PENDING  Incomplete  Blood culture (routine x 2)     Status: None (Preliminary result)   Collection Time: 12/15/21  7:19 PM   Specimen: BLOOD RIGHT HAND  Result Value Ref Range Status   Specimen Description BLOOD RIGHT HAND  Final   Special Requests   Final    BOTTLES DRAWN AEROBIC ONLY Blood Culture results may not  be optimal due to an inadequate volume of blood received in culture bottles   Culture   Final    NO GROWTH 4 DAYS Performed at [facility], [address] [place], [place] [address]    Report Status PENDING  Incomplete  Urine Culture     Status:  None   Collection Time: 12/16/21  6:20 AM   Specimen: Urine, Clean Catch  Result Value Ref Range Status   Specimen Description   Final    URINE, CLEAN CATCH Performed at [facility], [address] [place], [place] [address]    Special Requests   Final    NONE Performed at [facility], [address], [place], [place] [address]    Culture   Final    NO GROWTH Performed at [facility], [place] [address], [place], [place] [address]    Report Status 12/17/2021 FINAL  Final  MRSA Next Gen by PCR, Nasal     Status: None   Collection Time: 12/17/21  9:07 AM   Specimen: Nasal Mucosa; Nasal Swab  Result Value Ref Range Status   MRSA by PCR Next Gen NOT DETECTED NOT DETECTED Final    Comment: (NOTE) The GeneXpert MRSA Assay (FDA approved for NASAL specimens only), is one component of a comprehensive MRSA colonization surveillance program. It is not intended to diagnose MRSA infection nor to guide or monitor treatment for MRSA infections. Test performance is not FDA approved in patients less than 57 years old. Performed at [facility], [address] [place], [place] [address]      Labs: BNP (last 3 results) Recent Labs    12/15/21 1607  BNP [id number]*   Basic Metabolic Panel: Recent Labs  Lab 12/15/21 1607 12/16/21 0343 12/17/21 0500 12/18/21 0429 12/19/21 0424  NA 131* 135 139 137 137  K 3.6 3.5 3.5 2.3* 2.8*  CL 98 107 111 110 111  CO2 24 21* 21* 19* 19*  GLUCOSE 110* 138* 99 81 79  BUN 15 14 10  6* 5*  CREATININE 1.07 0.85 0.76 0.55* 0.60*  CALCIUM 7.4* 7.4* 7.5* 6.9* 7.0*  MG  --  1.6* 1.5* 2.3 1.9   Liver Function Tests: Recent Labs  Lab 12/15/21 1607 12/16/21 0343 12/17/21 0500 12/18/21 0429  AST 36 32 30  36  ALT 29 23 24 23   ALKPHOS 98 72 74 75  BILITOT 1.5* 1.4* 1.1 0.8  PROT 5.0* 5.1* 4.8* 4.3*  ALBUMIN 1.5* 2.5* 2.1* 1.7*   Recent Labs  Lab 12/15/21 1607  LIPASE 25   No results for input(s): AMMONIA in the last 168 hours. CBC: Recent Labs  Lab 12/15/21 1607 12/16/21 0343 12/17/21 0500 12/18/21 0429  WBC 16.1* 15.1* 19.4* 14.8*  NEUTROABS 11.8* 14.4* 17.1* 12.3*  HGB 10.4* 9.1* 10.1* 9.6*  HCT 29.9* 26.0* 28.1* 27.1*  MCV 103.8* 104.8* 102.9* 100.4*  PLT 135* 128* 153 129*   Cardiac Enzymes: Recent Labs  Lab 12/15/21 1607  CKTOTAL 293   BNP: Invalid input(s): POCBNP CBG: No results for input(s): GLUCAP in the last 168 hours. D-Dimer No results for input(s): DDIMER in the last 72 hours. Hgb A1c No results for input(s): HGBA1C in the last 72 hours. Lipid Profile No results for input(s): CHOL, HDL, LDLCALC, TRIG, CHOLHDL, LDLDIRECT in the last 72 hours. Thyroid function studies No results for input(s): TSH, T4TOTAL, T3FREE, THYROIDAB in the last 72 hours.  Invalid input(s): FREET3 Anemia work up No results for input(s): VITAMINB12, FOLATE, FERRITIN, TIBC, IRON, RETICCTPCT in the last 72 hours. Urinalysis    Component Value Date/Time   COLORURINE YELLOW 12/16/2021 0620   APPEARANCEUR CLEAR 12/16/2021 0620   LABSPEC 1.017 12/16/2021 0620   PHURINE 6.0 12/16/2021 0620   GLUCOSEU NEGATIVE 12/16/2021 0620   HGBUR NEGATIVE 12/16/2021 0620   BILIRUBINUR NEGATIVE 12/16/2021  [place] 12/16/2021 0620   PROTEINUR NEGATIVE 12/16/2021 0620   NITRITE NEGATIVE 12/16/2021 0620   LEUKOCYTESUR NEGATIVE 12/16/2021 0620   Sepsis Labs Invalid input(s): PROCALCITONIN,  WBC,  LACTICIDVEN Microbiology Recent Results (from the past 240 hour(s))  Resp Panel by RT-PCR (Flu A&B, Covid) Nasopharyngeal Swab     Status: None   Collection Time: 12/15/21  5:05 PM   Specimen: Nasopharyngeal Swab; Nasopharyngeal(NP) swabs in vial transport medium  Result Value Ref  Range Status   SARS Coronavirus 2 by RT PCR NEGATIVE NEGATIVE Final    Comment: (NOTE) SARS-CoV-2 target nucleic acids are NOT DETECTED.  The SARS-CoV-2 RNA is generally detectable in upper respiratory specimens during the acute phase of infection. The lowest concentration of SARS-CoV-2 viral copies this assay can detect is 138 copies/mL. A negative result does not preclude SARS-Cov-2 infection and should not be used as the sole basis for treatment or other patient management decisions. A negative result may occur with  improper specimen collection/handling, submission of specimen other than nasopharyngeal swab, presence of viral mutation(s) within the areas targeted by this assay, and inadequate number of viral copies(<138 copies/mL). A negative result must be combined with clinical observations, patient history, and epidemiological information. The expected result is Negative.  Fact Sheet for Patients:  EntrepreneurPulse.com.au  Fact Sheet for Healthcare Providers:  IncredibleEmployment.be  This test is no t yet approved or cleared by the [place] FDA and  has been authorized for detection and/or diagnosis of SARS-CoV-2 by FDA under an Emergency Use Authorization (EUA). This EUA will remain  in effect (meaning this test can be used) for the duration of the COVID-19 declaration under Section 564(b)(1) of the Act, 21 U.S.C.section 360bbb-3(b)(1), unless the authorization is terminated  or revoked sooner.       Influenza A by PCR NEGATIVE NEGATIVE Final   Influenza B by PCR NEGATIVE NEGATIVE Final    Comment: (NOTE) The Xpert Xpress SARS-CoV-2/FLU/RSV plus assay is intended as an aid in the diagnosis of influenza from Nasopharyngeal swab specimens and should not be used as a sole basis for treatment. Nasal washings and aspirates are unacceptable for Xpert Xpress SARS-CoV-2/FLU/RSV testing.  Fact Sheet for  Patients: EntrepreneurPulse.com.au  Fact Sheet for Healthcare Providers: IncredibleEmployment.be  This test is not yet approved or cleared by the [place] FDA and has been authorized for detection and/or diagnosis of SARS-CoV-2 by FDA under an Emergency Use Authorization (EUA). This EUA will remain in effect (meaning this test can be used) for the duration of the COVID-19 declaration under Section 564(b)(1) of the Act, 21 U.S.C. section 360bbb-3(b)(1), unless the authorization is terminated or revoked.  Performed at [facility], [address] [place], [place] [address]   Blood culture (routine x 2)     Status: None (Preliminary result)   Collection Time: 12/15/21  7:19 PM   Specimen: BLOOD LEFT HAND  Result Value Ref Range Status   Specimen Description BLOOD LEFT HAND  Final   Special Requests   Final    BOTTLES DRAWN AEROBIC AND ANAEROBIC Blood Culture results may not be optimal due to an inadequate volume of blood received in culture bottles   Culture   Final    NO GROWTH 4 DAYS Performed at [facility], [address] [place], [place] [address]    Report Status PENDING  Incomplete  Blood culture (routine x 2)     Status: None (Preliminary result)   Collection Time: 12/15/21  7:19 PM  Specimen: BLOOD RIGHT HAND  Result Value Ref Range Status   Specimen Description BLOOD RIGHT HAND  Final   Special Requests   Final    BOTTLES DRAWN AEROBIC ONLY Blood Culture results may not be optimal due to an inadequate volume of blood received in culture bottles   Culture   Final    NO GROWTH 4 DAYS Performed at [facility], [address] [place], [place] [address]    Report Status PENDING  Incomplete  Urine Culture     Status: None   Collection Time: 12/16/21  6:20 AM   Specimen: Urine, Clean Catch  Result Value Ref Range Status   Specimen Description   Final    URINE, CLEAN CATCH Performed at [facility], [address]  [place], [place] [address]    Special Requests   Final    NONE Performed at [facility], [address] [place], [place] [address]    Culture   Final    NO GROWTH Performed at [facility], [place] [address], [place], [place] [address]    Report Status 12/17/2021 FINAL  Final  MRSA Next Gen by PCR, Nasal     Status: None   Collection Time: 12/17/21  9:07 AM   Specimen: Nasal Mucosa; Nasal Swab  Result Value Ref Range Status   MRSA by PCR Next Gen NOT DETECTED NOT DETECTED Final    Comment: (NOTE) The GeneXpert MRSA Assay (FDA approved for NASAL specimens only), is one component of a comprehensive MRSA colonization surveillance program. It is not intended to diagnose MRSA infection nor to guide or monitor treatment for MRSA infections. Test performance is not FDA approved in patients less than 53 years old. Performed at [facility], [address] [place], [place] [address]     Time coordinating discharge:  38 mins   SIGNED:  Irwin Brakeman, MD  [facility] 12/20/2021, 9:18 AM How to contact the [facility] Attending or Consulting provider [place] or covering provider during after hours [place], for this patient?  Check the care team in [facility] and look for a) attending/consulting [facility] provider listed and b) the [facility] team listed Log into [email or web address] and use [place]'s universal password to access. If you do not have the password, please contact the hospital operator. Locate the [facility] provider you are looking for under [facility] and page to a number that you can be directly reached. If you still have difficulty reaching the provider, please page the [facility] (Director on Call) for the Hospitalists listed on amion for assistance.

## 2021-12-20 NOTE — Discharge Instructions (Signed)
IMPORTANT INFORMATION: PAY CLOSE ATTENTION  ? ?PHYSICIAN DISCHARGE INSTRUCTIONS ? ?Follow with Primary care provider  Hasanaj, Xaje A, MD  and other consultants as instructed by your Hospitalist Physician ? ?SEEK MEDICAL CARE OR RETURN TO EMERGENCY ROOM IF SYMPTOMS COME BACK, WORSEN OR NEW PROBLEM DEVELOPS  ? ?Please note: ?You were cared for by a hospitalist during your hospital stay. Every effort will be made to forward records to your primary care provider.  You can request that your primary care provider send for your hospital records if they have not received them.  Once you are discharged, your primary care physician will handle any further medical issues. Please note that NO REFILLS for any discharge medications will be authorized once you are discharged, as it is imperative that you return to your primary care physician (or establish a relationship with a primary care physician if you do not have one) for your post hospital discharge needs so that they can reassess your need for medications and monitor your lab values. ? ?Please get a complete blood count and chemistry panel checked by your Primary MD at your next visit, and again as instructed by your Primary MD. ? ?Get Medicines reviewed and adjusted: ?Please take all your medications with you for your next visit with your Primary MD ? ?Laboratory/radiological data: ?Please request your Primary MD to go over all hospital tests and procedure/radiological results at the follow up, please ask your primary care provider to get all Hospital records sent to his/her office. ? ?In some cases, they will be blood work, cultures and biopsy results pending at the time of your discharge. Please request that your primary care provider follow up on these results. ? ?If you are diabetic, please bring your blood sugar readings with you to your follow up appointment with primary care.   ? ?Please call and make your follow up appointments as soon as possible.   ? ?Also Note  the following: ?If you experience worsening of your admission symptoms, develop shortness of breath, life threatening emergency, suicidal or homicidal thoughts you must seek medical attention immediately by calling 911 or calling your MD immediately  if symptoms less severe. ? ?You must read complete instructions/literature along with all the possible adverse reactions/side effects for all the Medicines you take and that have been prescribed to you. Take any new Medicines after you have completely understood and accpet all the possible adverse reactions/side effects.  ? ?Do not drive when taking Pain medications or sleeping medications (Benzodiazepines) ? ?Do not take more than prescribed Pain, Sleep and Anxiety Medications. It is not advisable to combine anxiety,sleep and pain medications without talking with your primary care practitioner ? ?Special Instructions: If you have smoked or chewed Tobacco  in the last 2 yrs please stop smoking, stop any regular Alcohol  and or any Recreational drug use. ? ?Wear Seat belts while driving.  Do not drive if taking any narcotic, mind altering or controlled substances or recreational drugs or alcohol.  ? ? ? ? ? ?

## 2021-12-20 NOTE — Plan of Care (Signed)

## 2022-01-21 ENCOUNTER — Inpatient Hospital Stay (HOSPITAL_COMMUNITY)
Admission: EM | Admit: 2022-01-21 | Discharge: 2022-01-23 | DRG: 871 | Disposition: A | Discharge status: Home / Self Care | Payer: PPO | Attending: Internal Medicine | Admitting: Internal Medicine

## 2022-01-21 ENCOUNTER — Other Ambulatory Visit: Payer: Self-pay

## 2022-01-21 ENCOUNTER — Emergency Department (HOSPITAL_COMMUNITY): Payer: PPO

## 2022-01-21 ENCOUNTER — Encounter (HOSPITAL_COMMUNITY): Payer: Self-pay | Admitting: Emergency Medicine

## 2022-01-21 DIAGNOSIS — E860 Dehydration: Secondary | ICD-10-CM | POA: Diagnosis present

## 2022-01-21 DIAGNOSIS — I5032 Chronic diastolic (congestive) heart failure: Secondary | ICD-10-CM | POA: Diagnosis present

## 2022-01-21 DIAGNOSIS — I82409 Acute embolism and thrombosis of unspecified deep veins of unspecified lower extremity: Secondary | ICD-10-CM

## 2022-01-21 DIAGNOSIS — Z7901 Long term (current) use of anticoagulants: Secondary | ICD-10-CM

## 2022-01-21 DIAGNOSIS — Z20822 Contact with and (suspected) exposure to covid-19: Secondary | ICD-10-CM | POA: Diagnosis present

## 2022-01-21 DIAGNOSIS — Z681 Body mass index (BMI) 19 or less, adult: Secondary | ICD-10-CM

## 2022-01-21 DIAGNOSIS — E43 Unspecified severe protein-calorie malnutrition: Secondary | ICD-10-CM | POA: Diagnosis present

## 2022-01-21 DIAGNOSIS — J9811 Atelectasis: Secondary | ICD-10-CM | POA: Diagnosis present

## 2022-01-21 DIAGNOSIS — J90 Pleural effusion, not elsewhere classified: Secondary | ICD-10-CM

## 2022-01-21 DIAGNOSIS — E8809 Other disorders of plasma-protein metabolism, not elsewhere classified: Secondary | ICD-10-CM | POA: Diagnosis present

## 2022-01-21 DIAGNOSIS — I9589 Other hypotension: Secondary | ICD-10-CM | POA: Diagnosis present

## 2022-01-21 DIAGNOSIS — J449 Chronic obstructive pulmonary disease, unspecified: Secondary | ICD-10-CM | POA: Diagnosis present

## 2022-01-21 DIAGNOSIS — Z86718 Personal history of other venous thrombosis and embolism: Secondary | ICD-10-CM

## 2022-01-21 DIAGNOSIS — A414 Sepsis due to anaerobes: Secondary | ICD-10-CM | POA: Diagnosis not present

## 2022-01-21 DIAGNOSIS — K219 Gastro-esophageal reflux disease without esophagitis: Secondary | ICD-10-CM | POA: Diagnosis present

## 2022-01-21 DIAGNOSIS — Z720 Tobacco use: Secondary | ICD-10-CM | POA: Diagnosis present

## 2022-01-21 DIAGNOSIS — F1010 Alcohol abuse, uncomplicated: Secondary | ICD-10-CM | POA: Diagnosis present

## 2022-01-21 DIAGNOSIS — K529 Noninfective gastroenteritis and colitis, unspecified: Secondary | ICD-10-CM | POA: Diagnosis not present

## 2022-01-21 DIAGNOSIS — I2510 Atherosclerotic heart disease of native coronary artery without angina pectoris: Secondary | ICD-10-CM | POA: Diagnosis present

## 2022-01-21 DIAGNOSIS — F17210 Nicotine dependence, cigarettes, uncomplicated: Secondary | ICD-10-CM | POA: Diagnosis present

## 2022-01-21 DIAGNOSIS — E876 Hypokalemia: Secondary | ICD-10-CM | POA: Diagnosis present

## 2022-01-21 DIAGNOSIS — A0472 Enterocolitis due to Clostridium difficile, not specified as recurrent: Principal | ICD-10-CM | POA: Diagnosis present

## 2022-01-21 DIAGNOSIS — A419 Sepsis, unspecified organism: Secondary | ICD-10-CM | POA: Diagnosis present

## 2022-01-21 DIAGNOSIS — E871 Hypo-osmolality and hyponatremia: Secondary | ICD-10-CM | POA: Diagnosis present

## 2022-01-21 DIAGNOSIS — I252 Old myocardial infarction: Secondary | ICD-10-CM

## 2022-01-21 DIAGNOSIS — Z79899 Other long term (current) drug therapy: Secondary | ICD-10-CM

## 2022-01-21 LAB — CBC WITH DIFFERENTIAL/PLATELET
Abs Immature Granulocytes: 0.23 10*3/uL — ABNORMAL HIGH (ref 0.00–0.07)
Basophils Absolute: 0.1 10*3/uL (ref 0.0–0.1)
Basophils Relative: 0 %
Eosinophils Absolute: 0 10*3/uL (ref 0.0–0.5)
Eosinophils Relative: 0 %
HCT: 27.4 % — ABNORMAL LOW (ref 39.0–52.0)
Hemoglobin: 8.9 g/dL — ABNORMAL LOW (ref 13.0–17.0)
Immature Granulocytes: 1 %
Lymphocytes Relative: 8 %
Lymphs Abs: 1.4 10*3/uL (ref 0.7–4.0)
MCH: 34.8 pg — ABNORMAL HIGH (ref 26.0–34.0)
MCHC: 32.5 g/dL (ref 30.0–36.0)
MCV: 107 fL — ABNORMAL HIGH (ref 80.0–100.0)
Monocytes Absolute: 0.6 10*3/uL (ref 0.1–1.0)
Monocytes Relative: 3 %
Neutro Abs: 16.5 10*3/uL — ABNORMAL HIGH (ref 1.7–7.7)
Neutrophils Relative %: 88 %
Platelets: 180 10*3/uL (ref 150–400)
RBC: 2.56 MIL/uL — ABNORMAL LOW (ref 4.22–5.81)
RDW: 12.7 % (ref 11.5–15.5)
WBC: 18.8 10*3/uL — ABNORMAL HIGH (ref 4.0–10.5)
nRBC: 0 % (ref 0.0–0.2)

## 2022-01-21 LAB — RESP PANEL BY RT-PCR (FLU A&B, COVID) ARPGX2
Influenza A by PCR: NEGATIVE
Influenza B by PCR: NEGATIVE
SARS Coronavirus 2 by RT PCR: NEGATIVE

## 2022-01-21 LAB — C DIFFICILE QUICK SCREEN W PCR REFLEX
C Diff antigen: POSITIVE — AB
C Diff interpretation: DETECTED
C Diff toxin: POSITIVE — AB

## 2022-01-21 LAB — COMPREHENSIVE METABOLIC PANEL
ALT: 16 U/L (ref 0–44)
AST: 25 U/L (ref 15–41)
Albumin: 1.7 g/dL — ABNORMAL LOW (ref 3.5–5.0)
Alkaline Phosphatase: 91 U/L (ref 38–126)
Anion gap: 12 (ref 5–15)
BUN: 14 mg/dL (ref 8–23)
CO2: 20 mmol/L — ABNORMAL LOW (ref 22–32)
Calcium: 7.7 mg/dL — ABNORMAL LOW (ref 8.9–10.3)
Chloride: 98 mmol/L (ref 98–111)
Creatinine, Ser: 0.72 mg/dL (ref 0.61–1.24)
GFR, Estimated: >60 mL/min (ref >60)
Glucose, Bld: 87 mg/dL (ref 70–99)
Potassium: 3.6 mmol/L (ref 3.5–5.1)
Sodium: 130 mmol/L — ABNORMAL LOW (ref 135–145)
Total Bilirubin: 0.8 mg/dL (ref 0.3–1.2)
Total Protein: 5.4 g/dL — ABNORMAL LOW (ref 6.5–8.1)

## 2022-01-21 LAB — URINALYSIS, ROUTINE W REFLEX MICROSCOPIC
Glucose, UA: NEGATIVE mg/dL
Hgb urine dipstick: NEGATIVE
Ketones, ur: 15 mg/dL — AB
Leukocytes,Ua: NEGATIVE
Nitrite: NEGATIVE
Protein, ur: NEGATIVE mg/dL
Specific Gravity, Urine: 1.015 (ref 1.005–1.030)
pH: 6.5 (ref 5.0–8.0)

## 2022-01-21 MED ORDER — LACTATED RINGERS IV BOLUS: 1000.0000 mL | Freq: Once | INTRAVENOUS | Status: DC

## 2022-01-21 MED ORDER — SODIUM CHLORIDE 0.9 % IV SOLN: INTRAVENOUS | Status: DC

## 2022-01-21 MED ORDER — CIPROFLOXACIN IN D5W 400 MG/200ML IV SOLN
400.0000 mg | Freq: Once | INTRAVENOUS | Status: AC
  Administered 2022-01-21: 400 mg via INTRAVENOUS
  Filled 2022-01-21: qty 200

## 2022-01-21 MED ORDER — IOHEXOL 300 MG/ML  SOLN
75.0000 mL | Freq: Once | INTRAMUSCULAR | Status: AC | PRN
  Administered 2022-01-21: 75 mL via INTRAVENOUS

## 2022-01-21 MED ORDER — SODIUM CHLORIDE 0.9 % IV BOLUS
1000.0000 mL | Freq: Once | INTRAVENOUS | Status: AC
  Administered 2022-01-21: 1000 mL via INTRAVENOUS

## 2022-01-21 MED ORDER — METRONIDAZOLE 500 MG/100ML IV SOLN
500.0000 mg | Freq: Two times a day (BID) | INTRAVENOUS | Status: DC
  Administered 2022-01-21: 500 mg via INTRAVENOUS
  Filled 2022-01-21: qty 100

## 2022-01-21 NOTE — ED Notes (Signed)
Patient transported to CT 

## 2022-01-21 NOTE — ED Triage Notes (Signed)
Pt reports diarrhea x1 month and has not been able to maintain normal stool after recent hospitalization. Approx 10 occurrences in past 24 hours. Poor appetite as well, but drinking fluids okay per pt.

## 2022-01-21 NOTE — ED Provider Notes (Signed)
°  Provider Note MRN:  [id number]  Arrival date & time: 01/22/22    ED Course and Medical Decision Making  Assumed care from Dr. Sabra Heck at shift change.  Colitis, question C. difficile, suspect will need admission.  Procedures  Final Clinical Impressions(s) / ED Diagnoses     ICD-10-CM   1. C. difficile colitis  A04.72       ED Discharge Orders     None       Discharge Instructions   None     Barth Kirks. Sedonia Small, [place] [email or web address]    Maudie Flakes, MD 01/22/22 [phone]

## 2022-01-21 NOTE — ED Provider Notes (Signed)
[facility] EMERGENCY DEPARTMENT Provider Note   CSN: [id number] Arrival date & time: 01/21/22  1711     History  Chief Complaint  Patient presents with   Diarrhea    Spencer Hickman is a 66 y.o. male.   Diarrhea  This patient is a 66 year old male, he is currently on Eliquis, he has a history of recently being admitted to the hospital, he was hospitalized from January 3 to December 20, 2021.  During that time the patient was diagnosed with dehydration secondary to diarrhea.  He had been having significant bowel incontinence.  He was hypotensive on arrival and was not taking very much fluids or anything by mouth.  He was treated for possible sepsis, he was treated for acute respiratory failure with hypoxia secondary to an aspiration event, he was treated for pancolitis with IV antibiotics and transitioned over to oral antibiotics.  He has chronic hypotension and was treated with midodrine 10 mg 3 times a day and had been on CIWA protocol for chronic alcohol abuse.  Additionally the patient had an ejection fraction of 60 to 65% as seen on echocardiogram, he had a DVT, treated with anticoagulants.  He presents back to the hospital today with generalized weakness, increasing amounts of diarrhea, had at least 6 episodes last night and 2 or 3 today, urine has become very dark, the patient is become more weak.  Home Medications Prior to Admission medications   Medication Sig Start Date End Date Taking? Authorizing Provider  albuterol (VENTOLIN HFA) 108 (90 Base) MCG/ACT inhaler Inhale 2 puffs into the lungs every 6 (six) hours as needed for shortness of breath. 12/20/21   Murlean Iba, MD  apixaban (ELIQUIS) 5 MG TABS tablet 2 po bid thru 1/12, then 1 po bid 12/20/21   Irwin Brakeman L, MD  dextromethorphan (DELSYM) 30 MG/5ML liquid Take 5 mLs (30 mg total) by mouth 2 (two) times daily as needed for cough. 02/18/20   Johnson, Clanford L, MD  ferrous sulfate 325 (65 FE) MG tablet Take 1  tablet (325 mg total) by mouth daily with breakfast. 02/19/20   Johnson, Clanford L, MD  folic acid (FOLVITE) 1 MG tablet Take 1 tablet (1 mg total) by mouth daily. 12/20/21   Johnson, Clanford L, MD  levofloxacin (LEVAQUIN) 750 MG tablet Take 1 tablet (750 mg total) by mouth daily. 12/21/21   Johnson, Clanford L, MD  midodrine (PROAMATINE) 10 MG tablet Take 1 tablet (10 mg total) by mouth 3 (three) times daily with meals. 12/20/21   Johnson, Clanford L, MD  mirtazapine (REMERON) 15 MG tablet Take 15 mg by mouth at bedtime.    [provider]  Multiple Vitamin (MULTIVITAMIN WITH MINERALS) TABS tablet Take 1 tablet by mouth daily. 02/19/20   Johnson, Clanford L, MD  omeprazole (PRILOSEC) 20 MG capsule Take 20 mg by mouth daily. 12/08/21   [provider]  phenol (CHLORASEPTIC) 1.4 % LIQD Use as directed 1 spray in the mouth or throat as needed for throat irritation / pain.    [provider]  QUEtiapine (SEROQUEL) 25 MG tablet Take 25 mg by mouth at bedtime. 10/28/21   [provider]  SPIRIVA HANDIHALER 18 MCG inhalation capsule Place 18 mcg into inhaler and inhale daily. 08/10/21   [provider]  thiamine 100 MG tablet Take 1 tablet (100 mg total) by mouth daily. 12/20/21   Murlean Iba, MD      Allergies    Patient has no  known allergies.    Review of Systems   Review of Systems  Gastrointestinal:  Positive for diarrhea.  All other systems reviewed and are negative.  Physical Exam Updated Vital Signs BP (!) 83/57    Pulse 83    Temp 97.7 F (36.5 C) (Oral)    Resp (!) 22    Ht 1.816 m (5' 11.5")    Wt 38.6 kg    SpO2 97%    BMI 11.69 kg/m  Physical Exam Vitals and nursing note reviewed.  Constitutional:      General: He is not in acute distress.    Appearance: He is well-developed. He is ill-appearing (Chronically ill-appearing and wasting).  HENT:     Head: Normocephalic and atraumatic.     Mouth/Throat:     Mouth: Mucous membranes are  dry.     Pharynx: No oropharyngeal exudate.  Eyes:     General: No scleral icterus.       Right eye: No discharge.        Left eye: No discharge.     Conjunctiva/sclera: Conjunctivae normal.     Pupils: Pupils are equal, round, and reactive to light.  Neck:     Thyroid: No thyromegaly.     Vascular: No JVD.  Cardiovascular:     Rate and Rhythm: Regular rhythm. Tachycardia present.     Heart sounds: Normal heart sounds. No murmur heard.   No friction rub. No gallop.     Comments: Tachycardic to 110 bpm Pulmonary:     Effort: Pulmonary effort is normal. No respiratory distress.     Breath sounds: Normal breath sounds. No wheezing or rales.  Abdominal:     General: Bowel sounds are normal. There is no distension.     Palpations: Abdomen is soft. There is no mass.     Tenderness: There is no abdominal tenderness. There is no guarding.  Musculoskeletal:        General: No tenderness. Normal range of motion.     Cervical back: Normal range of motion and neck supple.     Right lower leg: No edema.     Left lower leg: No edema.  Lymphadenopathy:     Cervical: No cervical adenopathy.  Skin:    General: Skin is warm and dry.     Findings: No erythema or rash.  Neurological:     Mental Status: He is alert.     Coordination: Coordination normal.     Comments: Awake alert and able to follow commands, generally weak  Psychiatric:        Behavior: Behavior normal.    ED Results / Procedures / Treatments   Labs (all labs ordered are listed, but only abnormal results are displayed) Labs Reviewed  C DIFFICILE QUICK SCREEN W PCR REFLEX   - Abnormal; Notable for the following components:      Result Value   C Diff antigen POSITIVE (*)    C Diff toxin POSITIVE (*)    All other components within normal limits  COMPREHENSIVE METABOLIC PANEL - Abnormal; Notable for the following components:   Sodium 130 (*)    CO2 20 (*)    Calcium 7.7 (*)    Total Protein 5.4 (*)    Albumin 1.7 (*)     All other components within normal limits  CBC WITH DIFFERENTIAL/PLATELET - Abnormal; Notable for the following components:   WBC 18.8 (*)    RBC 2.56 (*)    Hemoglobin 8.9 (*)  HCT 27.4 (*)    MCV 107.0 (*)    MCH 34.8 (*)    Neutro Abs 16.5 (*)    Abs Immature Granulocytes 0.23 (*)    All other components within normal limits  URINALYSIS, ROUTINE W REFLEX MICROSCOPIC - Abnormal; Notable for the following components:   Bilirubin Urine SMALL (*)    Ketones, ur 15 (*)    All other components within normal limits  RESP PANEL BY RT-PCR (FLU A&B, COVID) ARPGX2  GASTROINTESTINAL PANEL BY PCR, STOOL (REPLACES STOOL CULTURE)  URINE CULTURE    EKG None  Radiology CT ABDOMEN PELVIS W CONTRAST  Result Date: 01/21/2022 CLINICAL DATA:  Acute nonlocalized abdominal pain. History of recent colitis. Generalized abdominal pain and diarrhea for 1 month. EXAM: CT ABDOMEN AND PELVIS WITH CONTRAST TECHNIQUE: Multidetector CT imaging of the abdomen and pelvis was performed using the standard protocol following bolus administration of intravenous contrast. RADIATION DOSE REDUCTION: This exam was performed according to the departmental dose-optimization program which includes automated exposure control, adjustment of the mA and/or kV according to patient size and/or use of iterative reconstruction technique. CONTRAST:  83mL OMNIPAQUE IOHEXOL 300 MG/ML  SOLN COMPARISON:  12/15/2021 FINDINGS: Lower chest: Moderate bilateral pleural effusions with basilar atelectasis. Hepatobiliary: No focal liver abnormality is seen. No gallstones, gallbladder wall thickening, or biliary dilatation. Pancreas: Unremarkable. No pancreatic ductal dilatation or surrounding inflammatory changes. Spleen: Normal in size without focal abnormality. Adrenals/Urinary Tract: Adrenal glands are unremarkable. Kidneys are normal, without renal calculi, focal lesion, or hydronephrosis. Bladder is distended without wall thickening or filling  defect. Stomach/Bowel: Stomach, small bowel, and colon are not abnormally distended. Diffuse colonic wall thickening predominantly involving the descending and rectosigmoid colon. Changes likely represent colitis. Similar appearance to previous study. Appendix is normal. Vascular/Lymphatic: Aortic atherosclerosis. No enlarged abdominal or pelvic lymph nodes. Reproductive: Prostate gland is enlarged. Other: No free air or free fluid in the abdomen. Atrophic abdominal wall musculature. Musculoskeletal: Degenerative changes in the spine. IMPRESSION: 1. Persistent colonic wall thickening and edema consistent with colitis. 2. Bilateral pleural effusions with basilar atelectasis. 3. Aortic atherosclerosis. Electronically Signed   By: Lucienne Capers M.D.   On: 01/21/2022 21:06    Procedures .Critical Care Performed by: Noemi Chapel, MD Authorized by: Noemi Chapel, MD   Critical care provider statement:    Critical care time (minutes):  45   Critical care time was exclusive of:  Teaching time and separately billable procedures and treating other patients   Critical care was necessary to treat or prevent imminent or life-threatening deterioration of the following conditions:  Sepsis   Critical care was time spent personally by me on the following activities:  Development of treatment plan with patient or surrogate, discussions with consultants, evaluation of patient's response to treatment, examination of patient, ordering and review of laboratory studies, ordering and review of radiographic studies, ordering and performing treatments and interventions, pulse oximetry, re-evaluation of patient's condition, review of old charts and obtaining history from patient or surrogate   I assumed direction of critical care for this patient from another provider in my specialty: no     Care discussed with: admitting provider   Comments:          Medications Ordered in ED Medications  0.9 %  sodium chloride  infusion (0 mLs Intravenous Stopped 01/21/22 2350)  ciprofloxacin (CIPRO) IVPB 400 mg (400 mg Intravenous New Bag/Given 01/21/22 2323)  metroNIDAZOLE (FLAGYL) IVPB 500 mg (500 mg Intravenous New Bag/Given 01/21/22 2325)  iohexol (OMNIPAQUE) 300 MG/ML solution 75 mL (75 mLs Intravenous Contrast Given 01/21/22 2045)  sodium chloride 0.9 % bolus 1,000 mL (1,000 mLs Intravenous New Bag/Given 01/21/22 2358)    ED Course/ Medical Decision Making/ A&P                           Medical Decision Making Amount and/or Complexity of Data Reviewed Labs: ordered. Radiology: ordered.  Risk Prescription drug management.   This patient presents to the ED for concern of dehydration and generalized weakness, ongoing diarrhea, this involves an extensive number of treatment options, and is a complaint that carries with it a high risk of complications and morbidity.  The differential diagnosis includes infectious diarrhea, pancolitis, C. difficile colitis, renal dysfunction, electrolyte abnormalities, urinary tract infection   Co morbidities that complicate the patient evaluation  Severe protein calorie malnutrition and dehydration, chronic diarrhea   Additional history obtained:  Additional history obtained from electronic medical record External records from outside source obtained and reviewed including prior admission and discharge summaries from recent admission, echocardiogram as well, see HPI   Lab Tests:  I Ordered, and personally interpreted labs.  The pertinent results include: Leukocytosis of over 18,000   Imaging Studies ordered:  I ordered imaging studies including CT scan of the abdomen and pelvis showing pancolitis I independently visualized and interpreted imaging which showed pancolitis I agree with the radiologist interpretation   Cardiac Monitoring:  The patient was maintained on a cardiac monitor.  I personally viewed and interpreted the cardiac monitored which showed an underlying  rhythm of: Sinus tachycardia, improved with IV fluids   Medicines ordered and prescription drug management:  I ordered medication including antibiotics and IV fluids for a sepsis syndrome and colitis with dehydration Reevaluation of the patient after these medicines showed that the patient improved I have reviewed the patients home medicines and have made adjustments as needed   Test Considered:  CT scan considered and actually performed due to risk of worsening colitis   Critical Interventions:  Antibiotics IV fluids Evaluation for C. difficile    Problem List / ED Course:  Acute colitis, likely bacterial in origin, possibly C. difficile, treated with IV fluids, will need admission to the hospital due to dehydration and colitis and likely need for treatment of C. difficile   Reevaluation:  After the interventions noted above, I reevaluated the patient and found that they have : Improved however at the time of change of shift all of the results are not yet back and consultation will need to be obtained with hospitalist, Dr. Sedonia Small will follow-up results and disposition accordingly   Social Determinants of Health:  None   Dispostion:  After consideration of the diagnostic results and the patients response to treatment, I feel that the patent would benefit from Admission.          Final Clinical Impression(s) / ED Diagnoses Final diagnoses:  None    Rx / DC Orders ED Discharge Orders     None         Noemi Chapel, MD 01/22/22 0005

## 2022-01-22 ENCOUNTER — Other Ambulatory Visit (HOSPITAL_COMMUNITY): Payer: Self-pay

## 2022-01-22 DIAGNOSIS — K219 Gastro-esophageal reflux disease without esophagitis: Secondary | ICD-10-CM

## 2022-01-22 DIAGNOSIS — J90 Pleural effusion, not elsewhere classified: Secondary | ICD-10-CM

## 2022-01-22 DIAGNOSIS — K529 Noninfective gastroenteritis and colitis, unspecified: Secondary | ICD-10-CM | POA: Diagnosis present

## 2022-01-22 DIAGNOSIS — I252 Old myocardial infarction: Secondary | ICD-10-CM | POA: Diagnosis not present

## 2022-01-22 DIAGNOSIS — Z79899 Other long term (current) drug therapy: Secondary | ICD-10-CM | POA: Diagnosis not present

## 2022-01-22 DIAGNOSIS — Z86718 Personal history of other venous thrombosis and embolism: Secondary | ICD-10-CM | POA: Diagnosis not present

## 2022-01-22 DIAGNOSIS — A0472 Enterocolitis due to Clostridium difficile, not specified as recurrent: Secondary | ICD-10-CM | POA: Diagnosis present

## 2022-01-22 DIAGNOSIS — J449 Chronic obstructive pulmonary disease, unspecified: Secondary | ICD-10-CM

## 2022-01-22 DIAGNOSIS — E860 Dehydration: Secondary | ICD-10-CM

## 2022-01-22 DIAGNOSIS — E871 Hypo-osmolality and hyponatremia: Secondary | ICD-10-CM

## 2022-01-22 DIAGNOSIS — Z20822 Contact with and (suspected) exposure to covid-19: Secondary | ICD-10-CM | POA: Diagnosis present

## 2022-01-22 DIAGNOSIS — Z7901 Long term (current) use of anticoagulants: Secondary | ICD-10-CM | POA: Diagnosis not present

## 2022-01-22 DIAGNOSIS — I82409 Acute embolism and thrombosis of unspecified deep veins of unspecified lower extremity: Secondary | ICD-10-CM

## 2022-01-22 DIAGNOSIS — A09 Infectious gastroenteritis and colitis, unspecified: Secondary | ICD-10-CM

## 2022-01-22 DIAGNOSIS — I5032 Chronic diastolic (congestive) heart failure: Secondary | ICD-10-CM

## 2022-01-22 DIAGNOSIS — I9589 Other hypotension: Secondary | ICD-10-CM

## 2022-01-22 DIAGNOSIS — E43 Unspecified severe protein-calorie malnutrition: Secondary | ICD-10-CM | POA: Diagnosis present

## 2022-01-22 DIAGNOSIS — F1010 Alcohol abuse, uncomplicated: Secondary | ICD-10-CM | POA: Diagnosis present

## 2022-01-22 DIAGNOSIS — E876 Hypokalemia: Secondary | ICD-10-CM | POA: Diagnosis present

## 2022-01-22 DIAGNOSIS — A414 Sepsis due to anaerobes: Secondary | ICD-10-CM | POA: Diagnosis present

## 2022-01-22 DIAGNOSIS — F17210 Nicotine dependence, cigarettes, uncomplicated: Secondary | ICD-10-CM | POA: Diagnosis present

## 2022-01-22 DIAGNOSIS — Z681 Body mass index (BMI) 19 or less, adult: Secondary | ICD-10-CM | POA: Diagnosis not present

## 2022-01-22 DIAGNOSIS — I2510 Atherosclerotic heart disease of native coronary artery without angina pectoris: Secondary | ICD-10-CM | POA: Diagnosis present

## 2022-01-22 DIAGNOSIS — E8809 Other disorders of plasma-protein metabolism, not elsewhere classified: Secondary | ICD-10-CM

## 2022-01-22 DIAGNOSIS — J9811 Atelectasis: Secondary | ICD-10-CM | POA: Diagnosis present

## 2022-01-22 DIAGNOSIS — D72829 Elevated white blood cell count, unspecified: Secondary | ICD-10-CM

## 2022-01-22 LAB — COMPREHENSIVE METABOLIC PANEL
ALT: 14 U/L (ref 0–44)
AST: 20 U/L (ref 15–41)
Albumin: 1.6 g/dL — ABNORMAL LOW (ref 3.5–5.0)
Alkaline Phosphatase: 83 U/L (ref 38–126)
Anion gap: 4 — ABNORMAL LOW (ref 5–15)
BUN: 11 mg/dL (ref 8–23)
CO2: 23 mmol/L (ref 22–32)
Calcium: 7.2 mg/dL — ABNORMAL LOW (ref 8.9–10.3)
Chloride: 108 mmol/L (ref 98–111)
Creatinine, Ser: 0.58 mg/dL — ABNORMAL LOW (ref 0.61–1.24)
GFR, Estimated: >60 mL/min (ref >60)
Glucose, Bld: 81 mg/dL (ref 70–99)
Potassium: 4 mmol/L (ref 3.5–5.1)
Sodium: 135 mmol/L (ref 135–145)
Total Bilirubin: 0.9 mg/dL (ref 0.3–1.2)
Total Protein: 4.9 g/dL — ABNORMAL LOW (ref 6.5–8.1)

## 2022-01-22 LAB — CBC
HCT: 28 % — ABNORMAL LOW (ref 39.0–52.0)
Hemoglobin: 9.2 g/dL — ABNORMAL LOW (ref 13.0–17.0)
MCH: 35.1 pg — ABNORMAL HIGH (ref 26.0–34.0)
MCHC: 32.9 g/dL (ref 30.0–36.0)
MCV: 106.9 fL — ABNORMAL HIGH (ref 80.0–100.0)
Platelets: 176 10*3/uL (ref 150–400)
RBC: 2.62 MIL/uL — ABNORMAL LOW (ref 4.22–5.81)
RDW: 12.9 % (ref 11.5–15.5)
WBC: 14.3 10*3/uL — ABNORMAL HIGH (ref 4.0–10.5)
nRBC: 0 % (ref 0.0–0.2)

## 2022-01-22 LAB — APTT: aPTT: 35 seconds (ref 24–36)

## 2022-01-22 LAB — MAGNESIUM: Magnesium: 1.6 mg/dL — ABNORMAL LOW (ref 1.7–2.4)

## 2022-01-22 LAB — PHOSPHORUS: Phosphorus: 2.9 mg/dL (ref 2.5–4.6)

## 2022-01-22 LAB — FOLATE: Folate: 23.8 ng/mL (ref >5.9)

## 2022-01-22 LAB — VITAMIN B12: Vitamin B-12: 316 pg/mL (ref 180–914)

## 2022-01-22 MED ORDER — THIAMINE HCL 100 MG PO TABS
100.0000 mg | ORAL_TABLET | Freq: Every day | ORAL | Status: DC
  Administered 2022-01-22 – 2022-01-23 (×2): 100 mg via ORAL
  Filled 2022-01-22 (×2): qty 1

## 2022-01-22 MED ORDER — LORAZEPAM 2 MG/ML IJ SOLN: 1.0000 mg | INTRAMUSCULAR | Status: DC | PRN

## 2022-01-22 MED ORDER — PANTOPRAZOLE SODIUM 40 MG PO TBEC
40.0000 mg | DELAYED_RELEASE_TABLET | Freq: Every day | ORAL | Status: DC
  Administered 2022-01-22 – 2022-01-23 (×2): 40 mg via ORAL
  Filled 2022-01-22 (×2): qty 1

## 2022-01-22 MED ORDER — SODIUM CHLORIDE 0.9 % IV BOLUS
1000.0000 mL | Freq: Once | INTRAVENOUS | Status: AC
  Administered 2022-01-22: 1000 mL via INTRAVENOUS

## 2022-01-22 MED ORDER — BUSPIRONE HCL 5 MG PO TABS
15.0000 mg | ORAL_TABLET | Freq: Two times a day (BID) | ORAL | Status: DC
  Administered 2022-01-22 – 2022-01-23 (×3): 15 mg via ORAL
  Filled 2022-01-22 (×3): qty 3

## 2022-01-22 MED ORDER — ACETAMINOPHEN 325 MG PO TABS
650.0000 mg | ORAL_TABLET | Freq: Four times a day (QID) | ORAL | Status: DC | PRN
  Administered 2022-01-23: 650 mg via ORAL
  Filled 2022-01-22: qty 2

## 2022-01-22 MED ORDER — VANCOMYCIN HCL 125 MG PO CAPS
125.0000 mg | ORAL_CAPSULE | Freq: Three times a day (TID) | ORAL | Status: DC
  Administered 2022-01-22 – 2022-01-23 (×7): 125 mg via ORAL
  Filled 2022-01-22 (×10): qty 1
  Filled 2022-01-22: qty 8
  Filled 2022-01-22 (×6): qty 1

## 2022-01-22 MED ORDER — ENOXAPARIN SODIUM 40 MG/0.4ML IJ SOSY
40.0000 mg | PREFILLED_SYRINGE | INTRAMUSCULAR | Status: DC
  Administered 2022-01-22: 40 mg via SUBCUTANEOUS
  Filled 2022-01-22: qty 0.4

## 2022-01-22 MED ORDER — FERROUS SULFATE 325 (65 FE) MG PO TABS
325.0000 mg | ORAL_TABLET | Freq: Every day | ORAL | Status: DC
  Administered 2022-01-23: 325 mg via ORAL
  Filled 2022-01-22: qty 1

## 2022-01-22 MED ORDER — MIRTAZAPINE 15 MG PO TABS
15.0000 mg | ORAL_TABLET | Freq: Every day | ORAL | Status: DC
  Administered 2022-01-22: 15 mg via ORAL
  Filled 2022-01-22: qty 1

## 2022-01-22 MED ORDER — SODIUM CHLORIDE 0.9 % IV SOLN
INTRAVENOUS | Status: DC
  Administered 2022-01-22: 50 mL/h via INTRAVENOUS

## 2022-01-22 MED ORDER — FOLIC ACID 1 MG PO TABS
1.0000 mg | ORAL_TABLET | Freq: Every day | ORAL | Status: DC
  Administered 2022-01-22 – 2022-01-23 (×2): 1 mg via ORAL
  Filled 2022-01-22 (×2): qty 1

## 2022-01-22 MED ORDER — ALBUTEROL SULFATE (2.5 MG/3ML) 0.083% IN NEBU: 3.0000 mL | INHALATION_SOLUTION | Freq: Four times a day (QID) | RESPIRATORY_TRACT | Status: DC | PRN

## 2022-01-22 MED ORDER — APIXABAN 5 MG PO TABS
5.0000 mg | ORAL_TABLET | Freq: Two times a day (BID) | ORAL | Status: DC
  Administered 2022-01-22 – 2022-01-23 (×3): 5 mg via ORAL
  Filled 2022-01-22 (×3): qty 1

## 2022-01-22 MED ORDER — NICOTINE 14 MG/24HR TD PT24
14.0000 mg | MEDICATED_PATCH | Freq: Every day | TRANSDERMAL | Status: DC
  Administered 2022-01-22 – 2022-01-23 (×2): 14 mg via TRANSDERMAL
  Filled 2022-01-22 (×2): qty 1

## 2022-01-22 MED ORDER — QUETIAPINE FUMARATE 25 MG PO TABS
25.0000 mg | ORAL_TABLET | Freq: Every day | ORAL | Status: DC
  Administered 2022-01-22: 25 mg via ORAL
  Filled 2022-01-22: qty 1

## 2022-01-22 MED ORDER — TIOTROPIUM BROMIDE MONOHYDRATE 18 MCG IN CAPS: 18.0000 ug | ORAL_CAPSULE | Freq: Every day | RESPIRATORY_TRACT | Status: DC

## 2022-01-22 MED ORDER — SODIUM CHLORIDE 0.9 % IV BOLUS
500.0000 mL | Freq: Once | INTRAVENOUS | Status: AC
  Administered 2022-01-22: 500 mL via INTRAVENOUS

## 2022-01-22 MED ORDER — UMECLIDINIUM BROMIDE 62.5 MCG/ACT IN AEPB
1.0000 | INHALATION_SPRAY | Freq: Every day | RESPIRATORY_TRACT | Status: DC
  Administered 2022-01-22 – 2022-01-23 (×2): 1 via RESPIRATORY_TRACT
  Filled 2022-01-22: qty 7

## 2022-01-22 MED ORDER — MAGNESIUM SULFATE 2 GM/50ML IV SOLN
2.0000 g | Freq: Once | INTRAVENOUS | Status: AC
  Administered 2022-01-22: 2 g via INTRAVENOUS
  Filled 2022-01-22: qty 50

## 2022-01-22 MED ORDER — ENSURE ENLIVE PO LIQD
237.0000 mL | Freq: Three times a day (TID) | ORAL | Status: DC
  Administered 2022-01-22 – 2022-01-23 (×2): 237 mL via ORAL

## 2022-01-22 MED ORDER — ENSURE ENLIVE PO LIQD
237.0000 mL | Freq: Two times a day (BID) | ORAL | Status: DC
  Administered 2022-01-22: 237 mL via ORAL
  Filled 2022-01-22 (×2): qty 237

## 2022-01-22 MED ORDER — ENOXAPARIN SODIUM 30 MG/0.3ML IJ SOSY: 30.0000 mg | PREFILLED_SYRINGE | INTRAMUSCULAR | Status: DC

## 2022-01-22 MED ORDER — ADULT MULTIVITAMIN W/MINERALS CH
1.0000 | ORAL_TABLET | Freq: Every day | ORAL | Status: DC
  Administered 2022-01-22 – 2022-01-23 (×2): 1 via ORAL
  Filled 2022-01-22 (×2): qty 1

## 2022-01-22 MED ORDER — LORAZEPAM 1 MG PO TABS: 1.0000 mg | ORAL_TABLET | ORAL | Status: DC | PRN

## 2022-01-22 MED ORDER — ACETAMINOPHEN 650 MG RE SUPP: 650.0000 mg | Freq: Four times a day (QID) | RECTAL | Status: DC | PRN

## 2022-01-22 MED ORDER — MIDODRINE HCL 5 MG PO TABS
10.0000 mg | ORAL_TABLET | Freq: Three times a day (TID) | ORAL | Status: DC
  Administered 2022-01-22 – 2022-01-23 (×5): 10 mg via ORAL
  Filled 2022-01-22 (×5): qty 2

## 2022-01-22 NOTE — Assessment & Plan Note (Deleted)
Due to dehydration 

## 2022-01-22 NOTE — Assessment & Plan Note (Addendum)
Echo EF 60-65%, grade 3 dd Without evidence of fluid overload at this time  Stable on room air without respiratory distress or conversational dyspnea. No peripheral edema.

## 2022-01-22 NOTE — Progress Notes (Signed)
°  Progress Note   Patient: Spencer Hickman [id number] DOB: [birth year] DOA: 01/21/2022     0 DOS: the patient was seen and examined on 01/22/2022   Brief hospital course: Spencer Hickman is a 66 y.o. male with medical history significant of  alcohol abuse, COPD, tobacco use disorder, coronary artery disease, hypotension, GERD who presents to the emergency department due to 1 month history of diarrhea. Patient states that he has been having about 5-7 episodes of watery diarrhea since recentralization in January. Patient was recently admitted from 1/3 to 1/8 due to sepsis secondary to pancolitis. During that admission, he was treated with levaquin and flagyl.   He states that initially, he was feeling better, however diarrhea soon worsened.  In the emergency department, CT abdomen pelvis revealed persistent colonic wall thickening and edema consistent with colitis.  C. difficile testing resulted positive and patient was started on oral vancomycin.  2/10: Patient states that he is feeling somewhat better.  Still having some diarrhea, had about 2 episodes earlier this morning.  Denies any abdominal pain.  Assessment and Plan: * C. difficile colitis- (present on admission) Continue oral vancomycin  Sepsis ([place])- (present on admission) Sepsis present on admission with tachycardia heart rate 128, respiratory rate 26, WBC 18.8 Secondary to C. Difficile Improved  DVT (deep venous thrombosis) (HCC) History of DVT Continue eliquis   Chronic diastolic CHF (congestive heart failure) (HCC) Echo EF 60-65%, grade 3 dd Without evidence of fluid overload at this time   Hypomagnesemia Replace, trend  GERD (gastroesophageal reflux disease) Continue Protonix  Chronic hypotension Continue midodrine  Hypoalbuminemia- (present on admission) Due to protein calorie malnutrition  Tobacco abuse- (present on admission) Cessation advised  Alcohol abuse- (present on admission) Monitor for withdrawal  on CIWA   COPD (chronic obstructive pulmonary disease) ([place]) Continue home meds  Dehydration-resolved as of 01/22/2022, (present on admission) Due to excessive diarrhea         Physical Exam: Vitals:   01/22/22 0530 01/22/22 0625 01/22/22 0808 01/22/22 1156  BP: 95/68 99/66  91/62  Pulse: 89 76  84  Resp: 15 18  18   Temp:  97.7 F (36.5 C)  97.9 F (36.6 C)  TempSrc:  Oral  Oral  SpO2: 98% 100% 100% 98%  Weight:  48 kg    Height:  5\' 11"  (1.803 m)     Examination: General exam: Appears calm and comfortable  Respiratory system: Clear to auscultation. Respiratory effort normal. Cardiovascular system: S1 & S2 heard, RRR. No pedal edema. Gastrointestinal system: Abdomen is nondistended, soft and nontender. Normal bowel sounds heard. Central nervous system: Alert and oriented. Non focal exam. Speech clear  Extremities: Symmetric in appearance bilaterally  Skin: No rashes, lesions or ulcers on exposed skin  Psychiatry: Judgement and insight appear stable. Mood & affect appropriate.    Data Reviewed:  BMP overall unremarkable.  Magnesium 1.6.  WBC 14.3.   Family Communication: No family at bedside  Disposition: Status is: Inpatient Remains inpatient appropriate because: Continue to monitor and treat for C. difficile          Planned Discharge Destination: Home      Author: Dessa Phi, DO 01/22/2022 1:06 PM  For on call review [email or web address].
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
## 2022-01-22 NOTE — Assessment & Plan Note (Signed)
Due to protein calorie malnutrition

## 2022-01-22 NOTE — Assessment & Plan Note (Signed)
Cessation advised. 

## 2022-01-22 NOTE — Assessment & Plan Note (Signed)
Continue Protonix °

## 2022-01-22 NOTE — Assessment & Plan Note (Signed)
History of DVT Continue eliquis 

## 2022-01-22 NOTE — Assessment & Plan Note (Addendum)
Continue oral vancomycin Hand hygiene and sanitizing home reviewed

## 2022-01-22 NOTE — TOC Initial Note (Addendum)
Transition of Care [facility]) - Initial/Assessment Note    Patient Details  Name: Spencer Hickman MRN: [id number] Date of Birth: [birth year]  [facility] of [facility]) CM/SW Contact:    Shade Flood, LCSW Phone Number: 01/22/2022, 11:21 AM  Clinical Narrative:                  Pt admitted from home with a high readmission risk score. Assessment completed today. Pt plans to return home at dc. Pt's friend Dominica Severin stays with pt and assists as needed. Pt's relative, Aldona Bar, lives on pt's property in her camper and she assists with pt's medications and bills. Pt has a walker and a BSC for DME. Pt has a PCP.  Pt has been active with [facility] for [facility] RN/PT/OT. Katheren Puller at [facility]. They will continue to provider [facility] care for pt at dc.   MD anticipating dc in 2-3 days. TOC will follow.  13:40 Received TOC consult for [facility] treatment resources. Spoke with pt who states that he really hasn't been drinking since he last got out of the hospital. He states that he has had about 6 beers total since that dc about a month ago. Pt reports that he thinks he will stop drinking all together after this stay. Pt denies need for treatment resources. Pt is asking for a nicotine patch. Relayed message to MD and RN.  Expected Discharge Plan: [place] Barriers to Discharge: Continued Medical Work up   Patient Goals and CMS Choice Patient states their goals for this hospitalization and ongoing recovery are:: go home      Expected Discharge Plan and Services Expected Discharge Plan: [place] In-house Referral: Clinical Social Work     Living arrangements for the past 2 months: [place]                                      Prior Living Arrangements/Services Living arrangements for the past 2 months: Single Family Home Lives with:: Self Patient language and need for interpreter reviewed:: Yes Do you feel safe going back to the place where you live?: Yes       Need for Family Participation in Patient Care: Yes (Comment) Care giver support system in place?: Yes (comment) Current home services: DME, Home OT, Home PT, Home RN Criminal Activity/Legal Involvement Pertinent to Current Situation/Hospitalization: No - Comment as needed  Activities of Daily Living Home Assistive Devices/Equipment: Environmental consultant (specify type), Wheelchair (rolling walker) ADL Screening (condition at time of admission) Patient's cognitive ability adequate to safely complete daily activities?: Yes Is the patient deaf or have difficulty hearing?: No Does the patient have difficulty seeing, even when wearing glasses/contacts?: No Does the patient have difficulty concentrating, remembering, or making decisions?: No Patient able to express need for assistance with ADLs?: Yes Does the patient have difficulty dressing or bathing?: Yes Independently performs ADLs?: No Communication: Independent Dressing (OT): Needs assistance Is this a change from baseline?: Pre-admission baseline Grooming: Needs assistance Is this a change from baseline?: Pre-admission baseline Feeding: Independent Bathing: Needs assistance Is this a change from baseline?: Pre-admission baseline Toileting: Needs assistance Is this a change from baseline?: Pre-admission baseline In/Out Bed: Needs assistance Is this a change from baseline?: Pre-admission baseline Walks in Home: Needs assistance Is this a change from baseline?: Pre-admission baseline Does the patient have difficulty walking or climbing stairs?: Yes Weakness of Legs:  Both Weakness of Arms/Hands: Both  Permission Sought/Granted                  Emotional Assessment       Orientation: : Oriented to Self, Oriented to Place, Oriented to  Time, Oriented to Situation   Psych Involvement: No (comment)  Admission diagnosis:  Colitis [K52.9] C. difficile colitis [A04.72] Patient Active Problem List   Diagnosis Date Noted   Diarrhea  01/22/2022   Hyponatremia 01/22/2022   Leukocytosis 01/22/2022   Bilateral pleural effusion 01/22/2022   Chronic hypotension 01/22/2022   GERD (gastroesophageal reflux disease) 01/22/2022   Dehydration    Sepsis ([place]) 12/15/2021   Protein calorie malnutrition ([place]) 12/15/2021   Chronic alcohol abuse 12/15/2021   Colitis 12/15/2021   Protein-calorie malnutrition, severe 02/15/2020   Community acquired pneumonia of left lung    SOB (shortness of breath)    Symptomatic anemia 02/06/2019   Absolute anemia    Syncopal episodes 02/05/2019   Microcytic anemia 02/05/2019   COPD (chronic obstructive pulmonary disease) ([place]) 02/05/2019   Alcohol abuse with intoxication ([place]) 02/05/2019   Tobacco abuse 02/05/2019   Hypoalbuminemia 02/05/2019   Chest pain 02/05/2019   PCP:  Neale Burly, MD Pharmacy:   [facility] [address] [place] [address] Phone: [phone] Fax: [phone]     Social Determinants of Health (SDOH) Interventions    Readmission Risk Interventions Readmission Risk Prevention Plan 01/22/2022 12/18/2021  Transportation Screening Complete Complete  Home Care Screening Complete -  Medication Review (RN CM) Complete -  HRI or [place] - Complete  Social Work Consult for [place] Planning/Counseling - Complete  Palliative Care Screening - Complete  Medication Review Press photographer) - Complete  Some recent data might be hidden

## 2022-01-22 NOTE — Hospital Course (Addendum)
Spencer Hickman is a 66 y.o. male with medical history significant of  alcohol abuse, COPD, tobacco use disorder, coronary artery disease, hypotension, GERD who presents to the emergency department due to 1 month history of diarrhea. Patient states that he has been having about 5-7 episodes of watery diarrhea since recentralization in January. Patient was recently admitted from 1/3 to 1/8 due to sepsis secondary to pancolitis. During that admission, he was treated with levaquin and flagyl.   He states that initially, he was feeling better, however diarrhea soon worsened.  In the emergency department, CT abdomen pelvis revealed persistent colonic wall thickening and edema consistent with colitis.  C. difficile testing resulted positive and patient was started on oral vancomycin.  2/10: Patient states that he is feeling somewhat better.  Still having some diarrhea, had about 2 episodes earlier this morning.  Denies any abdominal pain.  2/11: Only had 1 BM last night, none this morning. Tolerating meals without issue. No abdominal pain. Feels ready to go home.

## 2022-01-22 NOTE — Assessment & Plan Note (Signed)
Continue midodrine  

## 2022-01-22 NOTE — TOC Benefit Eligibility Note (Signed)
Patient Product/process development scientist completed.    The patient is currently admitted and upon discharge could be taking Vancomycin 125 mg capsules.  The current 10 day co-pay is, $1.45.   The patient is insured through [employer] Medicare Part D     Roland Earl, CPhT Pharmacy Patient Advocate Specialist [facility] Patient Advocate Team Direct Number: [phone]  Fax: [phone]

## 2022-01-22 NOTE — Assessment & Plan Note (Signed)
Due to C. difficile

## 2022-01-22 NOTE — Assessment & Plan Note (Signed)
- 

## 2022-01-22 NOTE — Progress Notes (Signed)
Patient's CIWA scores have been negative so far. Patient had a pleasant shift, tolerated some of his diet. One BM during the shift. No n/v. Willing to work with PT.

## 2022-01-22 NOTE — Assessment & Plan Note (Deleted)
Replace, trend °

## 2022-01-22 NOTE — Assessment & Plan Note (Signed)
-   Continue home meds °

## 2022-01-22 NOTE — Assessment & Plan Note (Addendum)
Sepsis present on admission with tachycardia heart rate 128, respiratory rate 26, WBC 18.8 Secondary to C. Difficile Improved

## 2022-01-22 NOTE — ED Notes (Signed)
Pt was given another blanket and is resting comfortably. Denies any pain.

## 2022-01-22 NOTE — Assessment & Plan Note (Signed)
Due to excessive diarrhea

## 2022-01-22 NOTE — Progress Notes (Signed)
Initial Nutrition Assessment  DOCUMENTATION CODES:   Underweight, Severe malnutrition in context of chronic illness  INTERVENTION:   -Increase Ensure Enlive po TID, each supplement provides 350 kcal and 20 grams of protein -MVI with minerals daily -Continue with regular diet  NUTRITION DIAGNOSIS:   Severe Malnutrition related to chronic illness as evidenced by energy intake < or equal to 75% for > or equal to 1 month, percent weight loss.  GOAL:   Patient will meet greater than or equal to 90% of their needs  MONITOR:   PO intake, Supplement acceptance, Labs, Weight trends, Skin, I & O's  REASON FOR ASSESSMENT:   Malnutrition Screening Tool    ASSESSMENT:   Spencer Hickman is a 66 y.o. male with medical history significant of  alcohol abuse, COPD, tobacco use disorder, coronary artery disease, hypotension, GERD who presents to the emergency department due to 1 month history of diarrhea, patient states that he has been having about 5-7 episodes of watery diarrhea since recentralization in January, he complained of poor appetite but states that he has been drinking fluids.  Patient complained of generalized weakness.  He denies fever, chills, nausea, vomiting, chest pain, shortness of breath.  Pt admitted with colitis.   Reviewed I/O's: +3.8 L x 24 hours  Spoke with pt over phone, who reports feeling better today. He reports decreased oral intake over the past month, secondary to poor appetite and diarrhea. He reports that when he tried to eat "it would just run right through me". He was initially able to tolerate small amounts of solid foods, however, has been only able to keep down water, Gatorade, and milk over the past 2-3 weeks. Pt reports he was able to tolerate some breakfast (a few bites of eggs and blueberry muffin) without abdominal pain or diarrhea this morning. Noted meal completions documented at 25%. Pt also consumed a chocolate Ensure this morning, that he really  enjoyed.   Per pt, his UBW is around 105# and reports he was 85# on admission. He estimates he has lsot about 20 pounds over the past month. Reviewed wt hx; pt has experienced a 14.7% wt loss over the past month, which is significant for time frame.   Suspect pt with malnutrition at baseline given multiple co-morbidities including ETOH and tobacco abuse and COPD, but acute illness may be exacerbating severity of malnutrition.   Discussed importance of good meal and supplement intake to promote healing. Pt amenable to continue Ensure supplements. Agree with continuing regular diet for widest variety of food selections.   Medications reviewed and include folic acid and sodium chloride infusion @ 50 ml/hr.   Labs reviewed.    Diet Order:   Diet Order             Diet regular Room service appropriate? Yes; Fluid consistency: Thin  Diet effective now                   EDUCATION NEEDS:   Education needs have been addressed  Skin:  Skin Assessment: Reviewed RN Assessment  Last BM:  01/21/22  Height:   Ht Readings from Last 1 Encounters:  01/22/22 5\' 11"  (1.803 m)    Weight:   Wt Readings from Last 1 Encounters:  01/22/22 48 kg    Ideal Body Weight:  78.2 kg  BMI:  Body mass index is 14.76 kg/m.  Estimated Nutritional Needs:   Kcal:  1900-2100  Protein:  95-110 grams  Fluid:  > 1.9 L  Loistine Chance, RD, LDN, [place] Registered Dietitian II Certified Diabetes Care and Education Specialist Please refer to [facility] for RD and/or RD on-call/weekend/after hours pager

## 2022-01-22 NOTE — ED Notes (Signed)
Report given to Kristy.

## 2022-01-22 NOTE — Assessment & Plan Note (Signed)
Monitor for withdrawal on CIWA

## 2022-01-23 DIAGNOSIS — E876 Hypokalemia: Secondary | ICD-10-CM

## 2022-01-23 LAB — CBC
HCT: 25.9 % — ABNORMAL LOW (ref 39.0–52.0)
Hemoglobin: 8.9 g/dL — ABNORMAL LOW (ref 13.0–17.0)
MCH: 36.3 pg — ABNORMAL HIGH (ref 26.0–34.0)
MCHC: 34.4 g/dL (ref 30.0–36.0)
MCV: 105.7 fL — ABNORMAL HIGH (ref 80.0–100.0)
Platelets: 177 10*3/uL (ref 150–400)
RBC: 2.45 MIL/uL — ABNORMAL LOW (ref 4.22–5.81)
RDW: 13 % (ref 11.5–15.5)
WBC: 9.8 10*3/uL (ref 4.0–10.5)
nRBC: 0 % (ref 0.0–0.2)

## 2022-01-23 LAB — BASIC METABOLIC PANEL
Anion gap: 5 (ref 5–15)
BUN: 10 mg/dL (ref 8–23)
CO2: 23 mmol/L (ref 22–32)
Calcium: 7.3 mg/dL — ABNORMAL LOW (ref 8.9–10.3)
Chloride: 106 mmol/L (ref 98–111)
Creatinine, Ser: 0.5 mg/dL — ABNORMAL LOW (ref 0.61–1.24)
GFR, Estimated: >60 mL/min (ref >60)
Glucose, Bld: 105 mg/dL — ABNORMAL HIGH (ref 70–99)
Potassium: 3.2 mmol/L — ABNORMAL LOW (ref 3.5–5.1)
Sodium: 134 mmol/L — ABNORMAL LOW (ref 135–145)

## 2022-01-23 LAB — URINE CULTURE: Culture: <10000 — AB

## 2022-01-23 LAB — MAGNESIUM: Magnesium: 1.9 mg/dL (ref 1.7–2.4)

## 2022-01-23 MED ORDER — VANCOMYCIN HCL 125 MG PO CAPS
125.0000 mg | ORAL_CAPSULE | Freq: Three times a day (TID) | ORAL | 0 refills | Status: AC
Start: 2022-01-23 — End: 2022-01-31

## 2022-01-23 MED ORDER — POTASSIUM CHLORIDE CRYS ER 20 MEQ PO TBCR
40.0000 meq | EXTENDED_RELEASE_TABLET | ORAL | Status: AC
  Administered 2022-01-23 (×2): 40 meq via ORAL
  Filled 2022-01-23 (×2): qty 2

## 2022-01-23 NOTE — Discharge Summary (Signed)
Physician Discharge Summary   Patient: Spencer Hickman MRN: [id number] DOB: 1956/02/05  Admit date:     01/21/2022  Discharge date: 01/23/22  Discharge Physician: Dessa Phi   PCP: Neale Burly, MD   Recommendations at discharge:    Follow up with outpatient PCP   Discharge Diagnoses: Principal Problem:   C. difficile colitis Active Problems:   Sepsis ([place])   COPD (chronic obstructive pulmonary disease) (HCC)   Alcohol abuse   Tobacco abuse   Hypoalbuminemia   Chronic hypotension   GERD (gastroesophageal reflux disease)   Chronic diastolic CHF (congestive heart failure) (HCC)   DVT (deep venous thrombosis) (HCC)   Hypokalemia  Resolved Problems:   Dehydration   Hyponatremia   Hypomagnesemia   Hospital Course: Spencer Hickman is a 66 y.o. male with medical history significant of  alcohol abuse, COPD, tobacco use disorder, coronary artery disease, hypotension, GERD who presents to the emergency department due to 1 month history of diarrhea. Patient states that he has been having about 5-7 episodes of watery diarrhea since recentralization in January. Patient was recently admitted from 1/3 to 1/8 due to sepsis secondary to pancolitis. During that admission, he was treated with levaquin and flagyl.   He states that initially, he was feeling better, however diarrhea soon worsened.  In the emergency department, CT abdomen pelvis revealed persistent colonic wall thickening and edema consistent with colitis.  C. difficile testing resulted positive and patient was started on oral vancomycin.  2/10: Patient states that he is feeling somewhat better.  Still having some diarrhea, had about 2 episodes earlier this morning.  Denies any abdominal pain.  2/11: Only had 1 BM last night, none this morning. Tolerating meals without issue. No abdominal pain. Feels ready to go home.   Assessment and Plan: * C. difficile colitis- (present on admission) Continue oral vancomycin Hand  hygiene and sanitizing home reviewed  Sepsis ([place])- (present on admission) Sepsis present on admission with tachycardia heart rate 128, respiratory rate 26, WBC 18.8 Secondary to C. Difficile Improved  Hypokalemia Replace   DVT (deep venous thrombosis) (HCC) History of DVT Continue eliquis   Chronic diastolic CHF (congestive heart failure) (HCC) Echo EF 60-65%, grade 3 dd Without evidence of fluid overload at this time  Stable on room air without respiratory distress or conversational dyspnea. No peripheral edema.   GERD (gastroesophageal reflux disease) Continue Protonix  Chronic hypotension Continue midodrine  Hypoalbuminemia- (present on admission) Due to protein calorie malnutrition  Tobacco abuse- (present on admission) Cessation advised  Alcohol abuse- (present on admission) Monitor for withdrawal on CIWA   COPD (chronic obstructive pulmonary disease) ([place]) Continue home meds  Dehydration-resolved as of 01/22/2022, (present on admission) Due to excessive diarrhea           Consultants: None Procedures performed: None   Disposition: Home Diet recommendation:  Cardiac diet  DISCHARGE MEDICATION: Allergies as of 01/23/2022   No Known Allergies      Medication List     TAKE these medications    albuterol 108 (90 Base) MCG/ACT inhaler Commonly known as: VENTOLIN HFA Inhale 2 puffs into the lungs every 6 (six) hours as needed for shortness of breath.   apixaban 5 MG Tabs tablet Commonly known as: ELIQUIS 2 po bid thru 1/12, then 1 po bid   busPIRone 15 MG tablet Commonly known as: BUSPAR Take 15 mg by mouth 2 (two) times daily.   ferrous sulfate 325 (65 FE) MG tablet Take 1 tablet (  325 mg total) by mouth daily with breakfast.   folic acid 1 MG tablet Commonly known as: FOLVITE Take 1 tablet (1 mg total) by mouth daily.   midodrine 10 MG tablet Commonly known as: PROAMATINE Take 1 tablet (10 mg total) by mouth 3 (three) times daily  with meals.   mirtazapine 15 MG tablet Commonly known as: REMERON Take 15 mg by mouth at bedtime.   multivitamin with minerals Tabs tablet Take 1 tablet by mouth daily.   omeprazole 20 MG capsule Commonly known as: PRILOSEC Take 20 mg by mouth daily.   QUEtiapine 25 MG tablet Commonly known as: SEROQUEL Take 25 mg by mouth at bedtime.   Spiriva HandiHaler 18 MCG inhalation capsule Generic drug: tiotropium Place 18 mcg into inhaler and inhale daily.   thiamine 100 MG tablet Take 1 tablet (100 mg total) by mouth daily.   vancomycin 125 MG capsule Commonly known as: VANCOCIN Take 1 capsule (125 mg total) by mouth 4 (four) times daily -  before meals and at bedtime for 8 days.        Follow-up Information     Neale Burly, MD. Schedule an appointment as soon as possible for a visit in 1 week(s).   Specialty: Internal Medicine Contact information: [address] [place] [id number] [phone]                 Discharge Exam: Filed Weights   01/21/22 1820 01/22/22 0625  Weight: 38.6 kg 48 kg   Examination: General exam: Appears calm and comfortable  Respiratory system: Clear to auscultation, mild diminished in bases. Respiratory effort normal. On room air  Cardiovascular system: S1 & S2 heard, RRR. No pedal edema. Gastrointestinal system: Abdomen is nondistended, soft and nontender. Normal bowel sounds heard. Central nervous system: Alert and oriented. Non focal exam. Speech clear  Extremities: Symmetric in appearance bilaterally  Skin: No rashes, lesions or ulcers on exposed skin  Psychiatry: Judgement and insight appear stable. Mood & affect appropriate.    Condition at discharge: stable  The results of significant diagnostics from this hospitalization (including imaging, microbiology, ancillary and laboratory) are listed below for reference.   Imaging Studies: CT ABDOMEN PELVIS W CONTRAST  Result Date: 01/21/2022 CLINICAL DATA:  Acute  nonlocalized abdominal pain. History of recent colitis. Generalized abdominal pain and diarrhea for 1 month. EXAM: CT ABDOMEN AND PELVIS WITH CONTRAST TECHNIQUE: Multidetector CT imaging of the abdomen and pelvis was performed using the standard protocol following bolus administration of intravenous contrast. RADIATION DOSE REDUCTION: This exam was performed according to the departmental dose-optimization program which includes automated exposure control, adjustment of the mA and/or kV according to patient size and/or use of iterative reconstruction technique. CONTRAST:  61mL OMNIPAQUE IOHEXOL 300 MG/ML  SOLN COMPARISON:  12/15/2021 FINDINGS: Lower chest: Moderate bilateral pleural effusions with basilar atelectasis. Hepatobiliary: No focal liver abnormality is seen. No gallstones, gallbladder wall thickening, or biliary dilatation. Pancreas: Unremarkable. No pancreatic ductal dilatation or surrounding inflammatory changes. Spleen: Normal in size without focal abnormality. Adrenals/Urinary Tract: Adrenal glands are unremarkable. Kidneys are normal, without renal calculi, focal lesion, or hydronephrosis. Bladder is distended without wall thickening or filling defect. Stomach/Bowel: Stomach, small bowel, and colon are not abnormally distended. Diffuse colonic wall thickening predominantly involving the descending and rectosigmoid colon. Changes likely represent colitis. Similar appearance to previous study. Appendix is normal. Vascular/Lymphatic: Aortic atherosclerosis. No enlarged abdominal or pelvic lymph nodes. Reproductive: Prostate gland is enlarged. Other: No free air or  free fluid in the abdomen. Atrophic abdominal wall musculature. Musculoskeletal: Degenerative changes in the spine. IMPRESSION: 1. Persistent colonic wall thickening and edema consistent with colitis. 2. Bilateral pleural effusions with basilar atelectasis. 3. Aortic atherosclerosis. Electronically Signed   By: Lucienne Capers M.D.   On:  01/21/2022 21:06    Microbiology: Results for orders placed or performed during the hospital encounter of 01/21/22  Resp Panel by RT-PCR (Flu A&B, Covid) Nasopharyngeal Swab     Status: None   Collection Time: 01/21/22  7:16 PM   Specimen: Nasopharyngeal Swab; Nasopharyngeal(NP) swabs in vial transport medium  Result Value Ref Range Status   SARS Coronavirus 2 by RT PCR NEGATIVE NEGATIVE Final    Comment: (NOTE) SARS-CoV-2 target nucleic acids are NOT DETECTED.  The SARS-CoV-2 RNA is generally detectable in upper respiratory specimens during the acute phase of infection. The lowest concentration of SARS-CoV-2 viral copies this assay can detect is 138 copies/mL. A negative result does not preclude SARS-Cov-2 infection and should not be used as the sole basis for treatment or other patient management decisions. A negative result may occur with  improper specimen collection/handling, submission of specimen other than nasopharyngeal swab, presence of viral mutation(s) within the areas targeted by this assay, and inadequate number of viral copies(<138 copies/mL). A negative result must be combined with clinical observations, patient history, and epidemiological information. The expected result is Negative.  Fact Sheet for Patients:  EntrepreneurPulse.com.au  Fact Sheet for Healthcare Providers:  IncredibleEmployment.be  This test is no t yet approved or cleared by the [place] FDA and  has been authorized for detection and/or diagnosis of SARS-CoV-2 by FDA under an Emergency Use Authorization (EUA). This EUA will remain  in effect (meaning this test can be used) for the duration of the COVID-19 declaration under Section 564(b)(1) of the Act, 21 U.S.C.section 360bbb-3(b)(1), unless the authorization is terminated  or revoked sooner.       Influenza A by PCR NEGATIVE NEGATIVE Final   Influenza B by PCR NEGATIVE NEGATIVE Final    Comment:  (NOTE) The Xpert Xpress SARS-CoV-2/FLU/RSV plus assay is intended as an aid in the diagnosis of influenza from Nasopharyngeal swab specimens and should not be used as a sole basis for treatment. Nasal washings and aspirates are unacceptable for Xpert Xpress SARS-CoV-2/FLU/RSV testing.  Fact Sheet for Patients: EntrepreneurPulse.com.au  Fact Sheet for Healthcare Providers: IncredibleEmployment.be  This test is not yet approved or cleared by the [place] FDA and has been authorized for detection and/or diagnosis of SARS-CoV-2 by FDA under an Emergency Use Authorization (EUA). This EUA will remain in effect (meaning this test can be used) for the duration of the COVID-19 declaration under Section 564(b)(1) of the Act, 21 U.S.C. section 360bbb-3(b)(1), unless the authorization is terminated or revoked.  Performed at [facility], [address] [place], [place] [address]   Urine Culture     Status: Abnormal   Collection Time: 01/21/22  9:19 PM   Specimen: Urine, Clean Catch  Result Value Ref Range Status   Specimen Description   Final    URINE, CLEAN CATCH Performed at [facility], [address], [place], [place] [address]    Special Requests   Final    NONE Performed at [facility], [address] [place], [place] [address]    Culture (A)  Final    <10,000 COLONIES/mL INSIGNIFICANT GROWTH Performed at [facility], [place] [address] [place], [place] [address]    Report Status 01/23/2022  FINAL  Final  C Difficile Quick Screen w PCR reflex     Status: Abnormal   Collection Time: 01/21/22 10:41 PM   Specimen: Stool  Result Value Ref Range Status   C Diff antigen POSITIVE (A) NEGATIVE Final    Comment: CRITICAL RESULT CALLED TO, READ BACK BY AND VERIFIED WITH: GINGER PRUITT @2353  [id number] BY ADGERJ    C Diff toxin POSITIVE (A) NEGATIVE Final    Comment: CRITICAL RESULT CALLED TO, READ BACK BY AND VERIFIED WITH: GINGER PRUITT  @2353  CO:[id number] BY ADGERJ    C Diff interpretation Toxin producing C. difficile detected.  Final    Comment: Performed at [facility], [address], [place], [place] [address]    Labs: CBC: Recent Labs  Lab 01/21/22 1920 01/22/22 0713 01/23/22 0432  WBC 18.8* 14.3* 9.8  NEUTROABS 16.5*  --   --   HGB 8.9* 9.2* 8.9*  HCT 27.4* 28.0* 25.9*  MCV 107.0* 106.9* 105.7*  PLT 180 176 [id number]   Basic Metabolic Panel: Recent Labs  Lab 01/21/22 1920 01/22/22 0713 01/23/22 0432  NA 130* 135 134*  K 3.6 4.0 3.2*  CL 98 108 106  CO2 20* 23 23  GLUCOSE 87 81 105*  BUN 14 11 10   CREATININE 0.72 0.58* 0.50*  CALCIUM 7.7* 7.2* 7.3*  MG  --  1.6* 1.9  PHOS  --  2.9  --    Liver Function Tests: Recent Labs  Lab 01/21/22 1920 01/22/22 0713  AST 25 20  ALT 16 14  ALKPHOS 91 83  BILITOT 0.8 0.9  PROT 5.4* 4.9*  ALBUMIN 1.7* 1.6*   CBG: No results for input(s): GLUCAP in the last 168 hours.  Discharge time spent: less than 30 minutes.  Signed: Dessa Phi, DO [facility] 01/23/2022

## 2022-01-23 NOTE — Progress Notes (Signed)
Pt slept poorly this shift, despite multiple medications for sleep. Medicated x1 with tylenol for reports of right shoulder pain-effective.

## 2022-01-23 NOTE — Plan of Care (Signed)

## 2022-01-23 NOTE — Progress Notes (Signed)
AVS given and explained tp patient and family.

## 2022-01-23 NOTE — Plan of Care (Signed)
  Problem: Education: Goal: Knowledge of General Education information will improve Description: Including pain rating scale, medication(s)/side effects and non-pharmacologic comfort measures Outcome: Progressing   Problem: Health Behavior/Discharge Planning: Goal: Ability to manage health-related needs will improve Outcome: Progressing   Problem: Clinical Measurements: Goal: Ability to maintain clinical measurements within normal limits will improve Outcome: Progressing   Problem: Elimination: Goal: Will not experience complications related to bowel motility Outcome: Progressing Goal: Will not experience complications related to urinary retention Outcome: Progressing   

## 2022-01-23 NOTE — Assessment & Plan Note (Signed)
Replace ° °

## 2022-01-23 NOTE — TOC Transition Note (Signed)
Transition of Care [facility]) - CM/SW Discharge Note   Patient Details  Name: Spencer Hickman MRN: [id number] Date of Birth: 05/30/1956  [facility] CM/SW Contact:  Hetty Ely, RN Phone Number: 01/23/2022, 11:22 AM   Final next level of care: Home w Home Health Services Barriers to Discharge: Barriers Resolved   Patient Goals and CMS Choice Patient states their goals for this hospitalization and ongoing recovery are:: go home      Discharge Placement                    Patient and family notified of of transfer: 01/23/22  Discharge Plan and Services In-house Referral: Clinical Social Work                        HH Arranged: OT, PT, RN HH Agency: [facility] Date HH Agency Contacted: 01/23/22 Time HH Agency Contacted: 1100 Representative spoke with at [facility] Agency: Barbara Cower  Social Determinants of Health (SDOH) Interventions     Readmission Risk Interventions Readmission Risk Prevention Plan 01/22/2022 12/18/2021  Transportation Screening Complete Complete  Home Care Screening Complete -  Medication Review (RN CM) Complete -  HRI or Home Care Consult - Complete  Social Work Consult for Recovery Care Planning/Counseling - Complete  Palliative Care Screening - Complete  Medication Review [facility]) - Complete  Some recent data might be hidden

## 2022-02-03 ENCOUNTER — Other Ambulatory Visit (HOSPITAL_COMMUNITY)
Admission: RE | Admit: 2022-02-03 | Discharge: 2022-02-03 | Disposition: A | Discharge status: Home / Self Care | Payer: PPO | Source: Ambulatory Visit | Attending: Internal Medicine | Admitting: Internal Medicine

## 2022-02-03 DIAGNOSIS — K5150 Left sided colitis without complications: Secondary | ICD-10-CM | POA: Diagnosis present

## 2022-02-05 LAB — STOOL CULTURE

## 2022-02-08 ENCOUNTER — Other Ambulatory Visit: Payer: Self-pay

## 2022-02-08 ENCOUNTER — Emergency Department (HOSPITAL_COMMUNITY): Payer: PPO

## 2022-02-08 ENCOUNTER — Emergency Department (HOSPITAL_COMMUNITY)
Admission: EM | Admit: 2022-02-08 | Discharge: 2022-02-08 | Disposition: A | Discharge status: Home / Self Care | Payer: PPO | Attending: Emergency Medicine | Admitting: Emergency Medicine

## 2022-02-08 ENCOUNTER — Encounter (HOSPITAL_COMMUNITY): Payer: Self-pay

## 2022-02-08 DIAGNOSIS — Z7901 Long term (current) use of anticoagulants: Secondary | ICD-10-CM | POA: Insufficient documentation

## 2022-02-08 DIAGNOSIS — R000 Tachycardia, unspecified: Secondary | ICD-10-CM | POA: Diagnosis not present

## 2022-02-08 DIAGNOSIS — J449 Chronic obstructive pulmonary disease, unspecified: Secondary | ICD-10-CM | POA: Insufficient documentation

## 2022-02-08 DIAGNOSIS — D72829 Elevated white blood cell count, unspecified: Secondary | ICD-10-CM | POA: Insufficient documentation

## 2022-02-08 DIAGNOSIS — I2510 Atherosclerotic heart disease of native coronary artery without angina pectoris: Secondary | ICD-10-CM | POA: Diagnosis not present

## 2022-02-08 DIAGNOSIS — R300 Dysuria: Secondary | ICD-10-CM | POA: Insufficient documentation

## 2022-02-08 DIAGNOSIS — A09 Infectious gastroenteritis and colitis, unspecified: Secondary | ICD-10-CM | POA: Diagnosis not present

## 2022-02-08 DIAGNOSIS — R319 Hematuria, unspecified: Secondary | ICD-10-CM | POA: Diagnosis not present

## 2022-02-08 DIAGNOSIS — Z7951 Long term (current) use of inhaled steroids: Secondary | ICD-10-CM | POA: Insufficient documentation

## 2022-02-08 LAB — CBC WITH DIFFERENTIAL/PLATELET
Abs Immature Granulocytes: 0.06 10*3/uL (ref 0.00–0.07)
Basophils Absolute: 0.1 10*3/uL (ref 0.0–0.1)
Basophils Relative: 1 %
Eosinophils Absolute: 0 10*3/uL (ref 0.0–0.5)
Eosinophils Relative: 0 %
HCT: 26.1 % — ABNORMAL LOW (ref 39.0–52.0)
Hemoglobin: 8.4 g/dL — ABNORMAL LOW (ref 13.0–17.0)
Immature Granulocytes: 0 %
Lymphocytes Relative: 14 %
Lymphs Abs: 2.1 10*3/uL (ref 0.7–4.0)
MCH: 33.9 pg (ref 26.0–34.0)
MCHC: 32.2 g/dL (ref 30.0–36.0)
MCV: 105.2 fL — ABNORMAL HIGH (ref 80.0–100.0)
Monocytes Absolute: 1.1 10*3/uL — ABNORMAL HIGH (ref 0.1–1.0)
Monocytes Relative: 7 %
Neutro Abs: 11.8 10*3/uL — ABNORMAL HIGH (ref 1.7–7.7)
Neutrophils Relative %: 78 %
Platelets: 243 10*3/uL (ref 150–400)
RBC: 2.48 MIL/uL — ABNORMAL LOW (ref 4.22–5.81)
RDW: 13.5 % (ref 11.5–15.5)
WBC: 15.1 10*3/uL — ABNORMAL HIGH (ref 4.0–10.5)
nRBC: 0 % (ref 0.0–0.2)

## 2022-02-08 LAB — TYPE AND SCREEN
ABO/RH(D): A POS
Antibody Screen: NEGATIVE

## 2022-02-08 LAB — HEPATIC FUNCTION PANEL
ALT: 20 U/L (ref 0–44)
AST: 44 U/L — ABNORMAL HIGH (ref 15–41)
Albumin: 2.2 g/dL — ABNORMAL LOW (ref 3.5–5.0)
Alkaline Phosphatase: 74 U/L (ref 38–126)
Bilirubin, Direct: 0.2 mg/dL (ref 0.0–0.2)
Indirect Bilirubin: 0.1 mg/dL — ABNORMAL LOW (ref 0.3–0.9)
Total Bilirubin: 0.3 mg/dL (ref 0.3–1.2)
Total Protein: 7 g/dL (ref 6.5–8.1)

## 2022-02-08 LAB — BASIC METABOLIC PANEL
Anion gap: 11 (ref 5–15)
BUN: 15 mg/dL (ref 8–23)
CO2: 23 mmol/L (ref 22–32)
Calcium: 8.4 mg/dL — ABNORMAL LOW (ref 8.9–10.3)
Chloride: 96 mmol/L — ABNORMAL LOW (ref 98–111)
Creatinine, Ser: 0.64 mg/dL (ref 0.61–1.24)
GFR, Estimated: >60 mL/min (ref >60)
Glucose, Bld: 90 mg/dL (ref 70–99)
Potassium: 3.9 mmol/L (ref 3.5–5.1)
Sodium: 130 mmol/L — ABNORMAL LOW (ref 135–145)

## 2022-02-08 LAB — LACTIC ACID, PLASMA
Lactic Acid, Venous: 1.8 mmol/L (ref 0.5–1.9)
Lactic Acid, Venous: 0.9 mmol/L (ref 0.5–1.9)

## 2022-02-08 LAB — PROTIME-INR
INR: 1.6 — ABNORMAL HIGH (ref 0.8–1.2)
Prothrombin Time: 18.9 seconds — ABNORMAL HIGH (ref 11.4–15.2)

## 2022-02-08 LAB — APTT: aPTT: 37 seconds — ABNORMAL HIGH (ref 24–36)

## 2022-02-08 LAB — VITAMIN B12: Vitamin B-12: 246 pg/mL (ref 180–914)

## 2022-02-08 LAB — MAGNESIUM: Magnesium: 1.7 mg/dL (ref 1.7–2.4)

## 2022-02-08 MED ORDER — FIDAXOMICIN 200 MG PO TABS: 200.0000 mg | ORAL_TABLET | Freq: Two times a day (BID) | ORAL | 0 refills | Status: AC

## 2022-02-08 MED ORDER — IOHEXOL 300 MG/ML  SOLN
100.0000 mL | Freq: Once | INTRAMUSCULAR | Status: AC | PRN
  Administered 2022-02-08: 75 mL via INTRAVENOUS

## 2022-02-08 MED ORDER — SODIUM CHLORIDE 0.9 % IV BOLUS
1000.0000 mL | Freq: Once | INTRAVENOUS | Status: AC
  Administered 2022-02-08: 1000 mL via INTRAVENOUS

## 2022-02-08 NOTE — ED Provider Notes (Cosign Needed)
I received this patient on shift change.  Please see Vanice Sarah, PA, note for full work-up.  In short this is a patient presenting with the complaint of diarrhea.  He has known C. difficile however he continues to have watery stools.  He has been taking oral vancomycin however continues to have diarrhea.  UA pending at shift change.  Will follow-up and dispo accordingly.  Physical Exam  BP 111/74    Pulse 81    Temp 98.1 F (36.7 C) (Oral)    Resp 18    Ht 6' (1.829 m)    Wt 45.4 kg    SpO2 100%    BMI 13.56 kg/m   Physical Exam Vitals and nursing note reviewed.  Constitutional:      Appearance: Normal appearance. He is ill-appearing.  HENT:     Head: Normocephalic and atraumatic.  Eyes:     General: No scleral icterus.    Conjunctiva/sclera: Conjunctivae normal.  Pulmonary:     Effort: Pulmonary effort is normal. No respiratory distress.  Skin:    Findings: No rash.  Neurological:     Mental Status: He is alert.  Psychiatric:        Mood and Affect: Mood normal.    Procedures  Procedures  ED Course / MDM    Medical Decision Making Amount and/or Complexity of Data Reviewed Labs: ordered. Radiology: ordered.  Risk Prescription drug management.   Patient is unable to urinate.  Has been given 2 L of fluid.  He denies catheterization.  At this time, he is unable to say how many pills of vancomycin he is left however he has been on it for multiple days.  I spoke about this patient with Dr. Almyra Free as well as previous PA provider.  Plan is to discharge him on fidaxomicin for continued C. difficile treatment.  He is agreeable to this.  Has been instructed to follow-up with primary care sometime this week.  Discharged       Emaree Chiu, [place], [place] 02/08/22 2230

## 2022-02-08 NOTE — Discharge Instructions (Addendum)
Please begin the new antibiotic sent to your pharmacy.  This should hopefully help clear up your remaining C. difficile.  Follow-up with your primary care provider this week if you continue to have concerns or bothersome symptoms.  Return to the emergency department with any worsening symptoms.

## 2022-02-08 NOTE — ED Provider Triage Note (Signed)
Emergency Medicine Provider Triage Evaluation Note  Spencer Hickman , a 66 y.o. male  was evaluated in triage.  Pt complains of diarrhea for the past month.  He was diagnosed with colitis and treated with levofloxacin and Flagyl.  Afterwards, he read presented with a diagnosis of C. difficile.  Currently he is only having watery stools however he and his family have started to notice leg saying that today all of the diarrhea has been "burgundy."  Patient is Eliquis for previous DVT.   Review of Systems  Positive: Weakness, diarrhea, hematochezia Negative: Syncope  Physical Exam  BP 96/70 (BP Location: Right Arm)    Pulse (!) 115    Temp 98.6 F (37 C) (Oral)    Resp 18    Ht 6' (1.829 m)    Wt 45.4 kg    SpO2 95%    BMI 13.56 kg/m  Gen:   Awake, no distress   Resp:  Normal effort  MSK:   Moves extremities without difficulty  Other:  Chronically ill-appearing, cachectic, soiled pajama pants  Medical Decision Making  Medically screening exam initiated at 3:24 PM.  Appropriate orders placed.  Spencer Hickman was informed that the remainder of the evaluation will be completed by another provider, this initial triage assessment does not replace that evaluation, and the importance of remaining in the ED until their evaluation is complete.     Rhae Hammock, PA-C 02/08/22 1611

## 2022-02-08 NOTE — ED Triage Notes (Signed)
Pt presents to ED with complaints of blood in his urine since yesterday.  Pt with continuous diarrhea also.

## 2022-02-08 NOTE — ED Provider Notes (Cosign Needed)
[facility] EMERGENCY DEPARTMENT Provider Note   CSN: [id number] Arrival date & time: 02/08/22  1506     History  Chief Complaint  Patient presents with   Hematuria   Diarrhea    Spencer Hickman is a 66 y.o. male.  HPI  Patient with medical history including alcohol dependency, COPD, CAD, C. difficile currently on vancomycin, DVT on Eliquis presents to the  with complaints of hematuria.  Patient states that this started over the last couple days, states he denies urinary frequency, hematuria and slight dysuria, denies any testicular pain or penile discharge, no flank tenderness, no lower abdominal tenderness.  He notes that he has no history of kidney stones, has never had this in the past, states that he does have an increase in diarrhea, was diagnosed with diverticulitis and states that his been having approximately 5 bowel movements daily, no hematochezia no melena, states that he still tolerating p.o., no fevers or chills, states he feels slightly weak but has no other complaints.  States he been compliant with all of his medications.  Reviewed patient's chart was seen 02/09 discharged on 02/11 diagnosed with colitis secondary to C. difficile, started on vancomycin.  Home Medications Prior to Admission medications   Medication Sig Start Date End Date Taking? Authorizing Provider  albuterol (VENTOLIN HFA) 108 (90 Base) MCG/ACT inhaler Inhale 2 puffs into the lungs every 6 (six) hours as needed for shortness of breath. 12/20/21   Murlean Iba, MD  apixaban (ELIQUIS) 5 MG TABS tablet 2 po bid thru 1/12, then 1 po bid 12/20/21   Johnson, Clanford L, MD  busPIRone (BUSPAR) 15 MG tablet Take 15 mg by mouth 2 (two) times daily. 12/28/21   [provider]  ferrous sulfate 325 (65 FE) MG tablet Take 1 tablet (325 mg total) by mouth daily with breakfast. 02/19/20   Wynetta Emery, Clanford L, MD  folic acid (FOLVITE) 1 MG tablet Take 1 tablet (1 mg total) by mouth daily. 12/20/21    Johnson, Clanford L, MD  midodrine (PROAMATINE) 10 MG tablet Take 1 tablet (10 mg total) by mouth 3 (three) times daily with meals. 12/20/21   Johnson, Clanford L, MD  mirtazapine (REMERON) 15 MG tablet Take 15 mg by mouth at bedtime.    [provider]  Multiple Vitamin (MULTIVITAMIN WITH MINERALS) TABS tablet Take 1 tablet by mouth daily. 02/19/20   Johnson, Clanford L, MD  omeprazole (PRILOSEC) 20 MG capsule Take 20 mg by mouth daily. 12/08/21   [provider]  QUEtiapine (SEROQUEL) 25 MG tablet Take 25 mg by mouth at bedtime. 10/28/21   [provider]  SPIRIVA HANDIHALER 18 MCG inhalation capsule Place 18 mcg into inhaler and inhale daily. 08/10/21   [provider]  thiamine 100 MG tablet Take 1 tablet (100 mg total) by mouth daily. 12/20/21   Murlean Iba, MD      Allergies    Patient has no known allergies.    Review of Systems   Review of Systems  Constitutional:  Negative for chills and fever.  Respiratory:  Negative for shortness of breath.   Cardiovascular:  Negative for chest pain.  Gastrointestinal:  Positive for diarrhea. Negative for abdominal pain and vomiting.  Genitourinary:  Positive for dysuria and hematuria. Negative for decreased urine volume.  Neurological:  Negative for headaches.   Physical Exam Updated Vital Signs BP 111/74    Pulse 81    Temp 98.1 F (36.7 C) (Oral)  Resp 18    Ht 6' (1.829 m)    Wt 45.4 kg    SpO2 100%    BMI 13.56 kg/m  Physical Exam Vitals and nursing note reviewed.  Constitutional:      General: He is not in acute distress.    Appearance: He is not ill-appearing.     Comments: Chronically ill-appearing  HENT:     Head: Normocephalic and atraumatic.     Nose: No congestion.     Mouth/Throat:     Mouth: Mucous membranes are dry.     Pharynx: Oropharynx is clear. No oropharyngeal exudate or posterior oropharyngeal erythema.  Eyes:     Conjunctiva/sclera: Conjunctivae normal.  Cardiovascular:      Rate and Rhythm: Regular rhythm. Tachycardia present.     Pulses: Normal pulses.     Heart sounds: No murmur heard.   No friction rub. No gallop.  Pulmonary:     Effort: No respiratory distress.     Breath sounds: No wheezing, rhonchi or rales.  Abdominal:     Palpations: Abdomen is soft.     Tenderness: There is no abdominal tenderness. There is no right CVA tenderness or left CVA tenderness.     Comments: Abdomen nondistended normal bowel sounds, dull to percussion, no guarding, rebound nausea, peritoneal sign.  Musculoskeletal:     Right lower leg: No edema.     Left lower leg: No edema.  Skin:    General: Skin is warm and dry.  Neurological:     Mental Status: He is alert.  Psychiatric:        Mood and Affect: Mood normal.    ED Results / Procedures / Treatments   Labs (all labs ordered are listed, but only abnormal results are displayed) Labs Reviewed  CBC WITH DIFFERENTIAL/PLATELET - Abnormal; Notable for the following components:      Result Value   WBC 15.1 (*)    RBC 2.48 (*)    Hemoglobin 8.4 (*)    HCT 26.1 (*)    MCV 105.2 (*)    Neutro Abs 11.8 (*)    Monocytes Absolute 1.1 (*)    All other components within normal limits  BASIC METABOLIC PANEL - Abnormal; Notable for the following components:   Sodium 130 (*)    Chloride 96 (*)    Calcium 8.4 (*)    All other components within normal limits  APTT - Abnormal; Notable for the following components:   aPTT 37 (*)    All other components within normal limits  PROTIME-INR - Abnormal; Notable for the following components:   Prothrombin Time 18.9 (*)    INR 1.6 (*)    All other components within normal limits  HEPATIC FUNCTION PANEL - Abnormal; Notable for the following components:   Albumin 2.2 (*)    AST 44 (*)    Indirect Bilirubin 0.1 (*)    All other components within normal limits  MAGNESIUM  VITAMIN B12  LACTIC ACID, PLASMA  URINALYSIS, ROUTINE W REFLEX MICROSCOPIC  LACTIC ACID, PLASMA  TYPE  AND SCREEN    EKG None  Radiology CT ABDOMEN PELVIS W CONTRAST  Result Date: 02/08/2022 CLINICAL DATA:  Sepsis EXAM: CT ABDOMEN AND PELVIS WITH CONTRAST TECHNIQUE: Multidetector CT imaging of the abdomen and pelvis was performed using the standard protocol following bolus administration of intravenous contrast. RADIATION DOSE REDUCTION: This exam was performed according to the departmental dose-optimization program which includes automated exposure control, adjustment of the mA and/or kV  according to patient size and/or use of iterative reconstruction technique. CONTRAST:  4mL OMNIPAQUE IOHEXOL 300 MG/ML  SOLN COMPARISON:  CT abdomen and pelvis dated January 21, 2022 FINDINGS: Lower chest: Small right pleural effusion, decreased in size compared to prior exam. Interval resolution left pleural effusion. Mild bibasilar atelectasis. Hepatobiliary: No focal liver abnormality is seen. No gallstones, gallbladder wall thickening, or biliary dilatation. Pancreas: Unremarkable. No pancreatic ductal dilatation or surrounding inflammatory changes. Spleen: Normal in size without focal abnormality. Adrenals/Urinary Tract: Bilateral adrenal glands are unremarkable. Kidneys enhance symmetrically with no evidence of hydronephrosis or nephrolithiasis. Bladder is unremarkable. Stomach/Bowel: Stomach is within normal limits. Appendix appears normal. Unchanged wall thickening of the rectosigmoid colon. No evidence of obstruction. Vascular/Lymphatic: Aortic atherosclerosis. No enlarged abdominal or pelvic lymph nodes. Reproductive: Mild prostatomegaly. Other: No abdominal wall hernia or abnormality. No abdominopelvic ascites. Musculoskeletal: No acute or significant osseous findings. IMPRESSION: 1. Unchanged wall thickening of the rectosigmoid colon which is likely due to colitis. 2. Small right pleural effusion, decreased in size compared to prior exam. Interval resolution left pleural effusion. 3.  Aortic Atherosclerosis  (ICD10-I70.0). Electronically Signed   By: Yetta Glassman M.D.   On: 02/08/2022 18:59    Procedures Procedures    Medications Ordered in ED Medications  sodium chloride 0.9 % bolus 1,000 mL (1,000 mLs Intravenous New Bag/Given 02/08/22 1809)  iohexol (OMNIPAQUE) 300 MG/ML solution 100 mL (75 mLs Intravenous Contrast Given 02/08/22 1824)    ED Course/ Medical Decision Making/ A&P                           Medical Decision Making Amount and/or Complexity of Data Reviewed Labs: ordered. Radiology: ordered.  Risk Prescription drug management.   This patient presents to the ED for concern of , this involves an extensive number of treatment options, and is a complaint that carries with it a high risk of complications and morbidity.  The differential diagnosis includes kidney stone, UTI, Pilo, toxic megacolon    Additional history obtained:  Additional history obtained from electronic medical record External records from outside source obtained and reviewed including please see HPI   Co morbidities that complicate the patient evaluation  Alcohol dependency, CHF  Social Determinants of Health:  N/A    Lab Tests:  I Ordered, and personally interpreted labs.  The pertinent results include: CBC shows cytosis 15.1, actually kidney hemoglobin of 8.4 at baseline, BMP shows sodium 130 chloride 96 calcium 8.4, AST 44 ALT 20 lactic 1.8's magnesium 1.7   Imaging Studies ordered:  I ordered imaging studies including CT and pelvis I independently visualized and interpreted imaging which showed shows unchanged colitis, small right pleural effusion decreasing from prior I agree with the radiologist interpretation   Cardiac Monitoring:  The patient was maintained on a cardiac monitor.  I personally viewed and interpreted the cardiac monitored which showed an underlying rhythm of: N/A   Medicines ordered and prescription drug management:  I ordered medication including fluids  for dehydration I have reviewed the patients home medicines and have made adjustments as needed   Reevaluation:  on arrival patient appears dry on my exam, suspect this is likely cause of his tachycardia, patient given a liter of fluids, is found resting calmly, vital signs remained stable we will continue to monitor.  Rule out I have low suspicion for kidney stone, Pilo, bowel obstruction, volvulus, toxic megacolon, intra-abdominal abscess CT imaging negative for these findings.  I have  low suspicion for sepsis as he is afebrile, normal lactic acid.  It is noted that he has elevated white count  and tachycardic on arrival but I suspect this is likely secondary due to dehydration as he was dry my exam likely not rehydrate enough after his bowel movements.     Dispostion and problem list Due to shift change patient in handoff to medicine bed mind PA-C she is provided HPI, current work-up, likely disposition  Follow-up on patient's UA and treat accordingly, also recommend following up on patient's vancomycin, if he still has this medication please have him continue this, if he has completed his course would consider switching him to fidaomicin and have him follow-up with PCP and/or GI.             Final Clinical Impression(s) / ED Diagnoses Final diagnoses:  Dysuria  Diarrhea of infectious origin    Rx / DC Orders ED Discharge Orders     None         Marcello Fennel, PA-C 02/08/22 1933

## 2022-03-04 ENCOUNTER — Encounter (INDEPENDENT_AMBULATORY_CARE_PROVIDER_SITE_OTHER): Payer: Self-pay | Admitting: *Deleted

## 2023-10-05 IMAGING — CT CT ABD-PELV W/ CM
2 of 5 series · 16 of 46 positions shown, 18 images · IV contrast (Omnipaque or Isovue)
Comparison: 12/15/2021

CLINICAL DATA: Acute nonlocalized abdominal pain. History of recent
colitis. Generalized abdominal pain and diarrhea for 1 month.

EXAM:
CT ABDOMEN AND PELVIS WITH CONTRAST
TECHNIQUE: Multidetector CT imaging of the abdomen and pelvis was performed
using the standard protocol following bolus administration of
intravenous contrast.

[Series 2: axial st · axial · 0.84mm/px · z∈[-735,-305]mm · 13 of 97 slices shown, 15 images]
[im 6/97  soft-tissue]
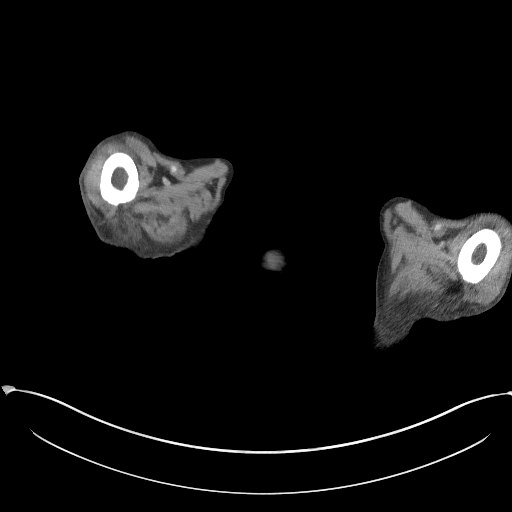
[im 6/97  bone]
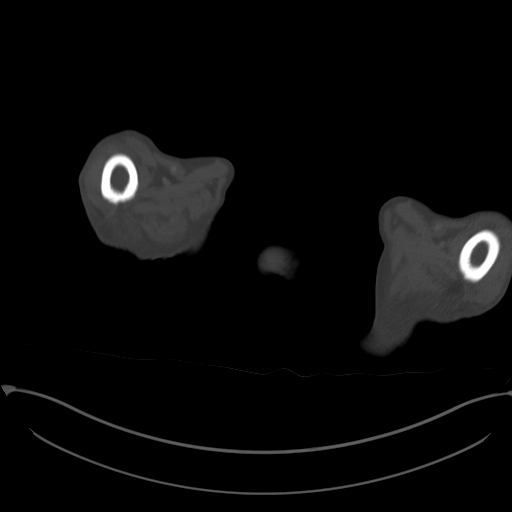
[im 12/97  soft-tissue]
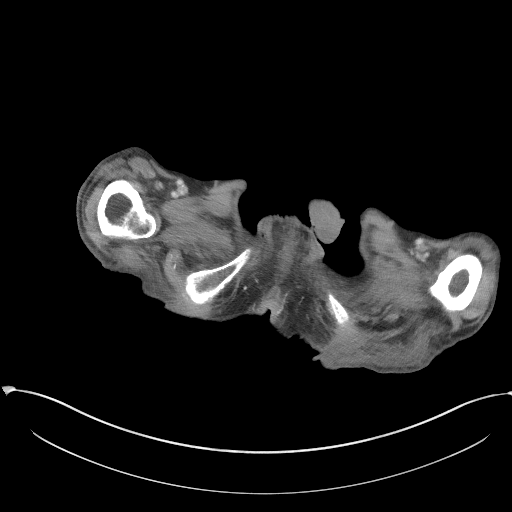
[im 23/97  soft-tissue]
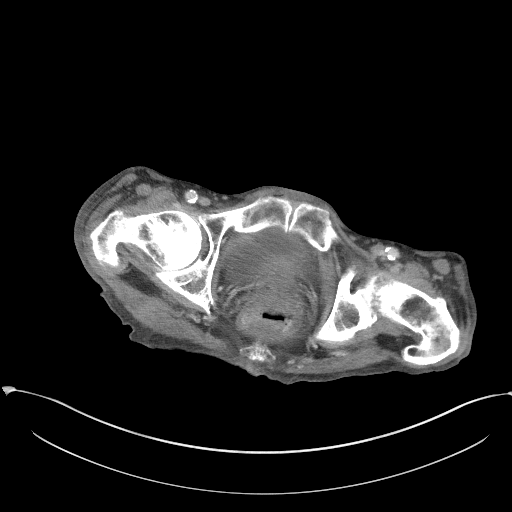
[im 29/97  soft-tissue]
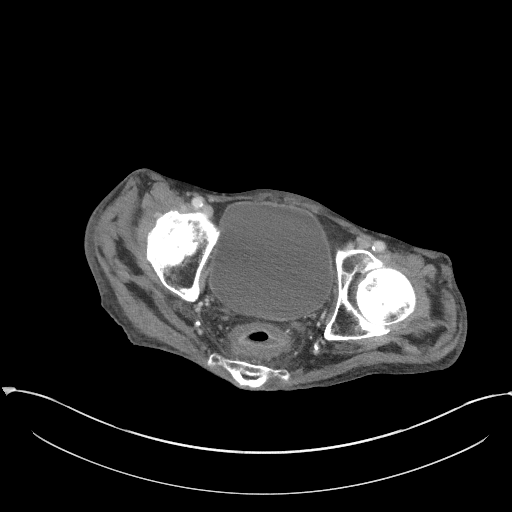
[im 34/97  soft-tissue]
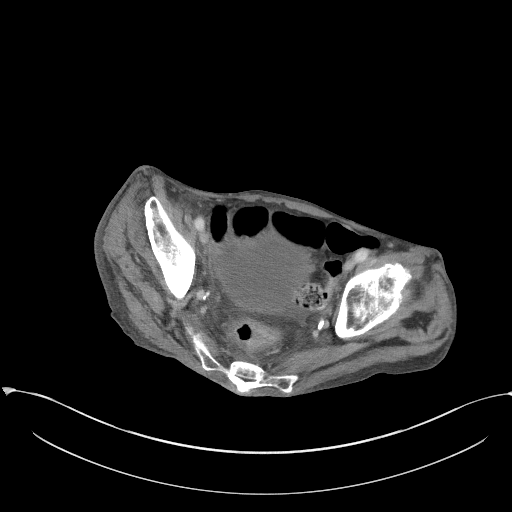
[im 40/97  soft-tissue]
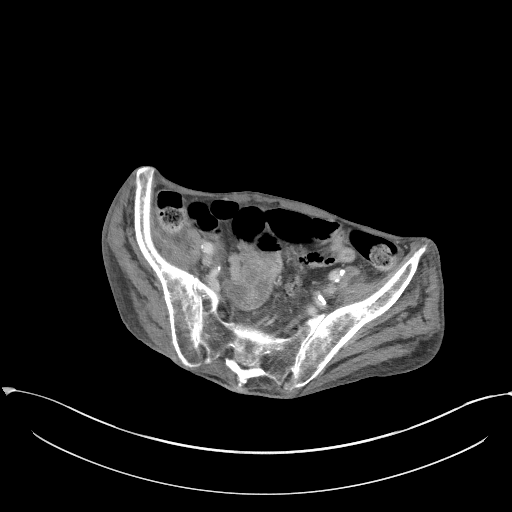
[im 51/97  soft-tissue]
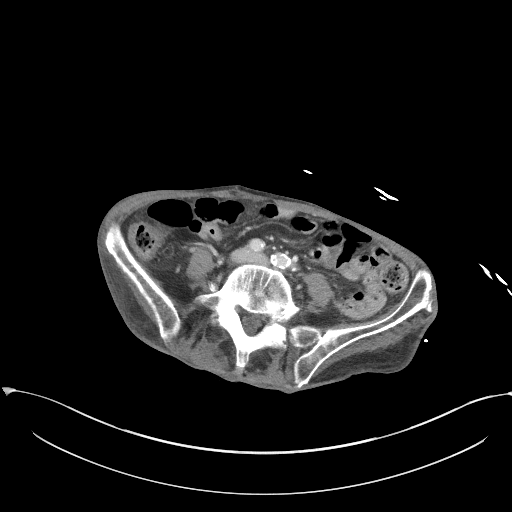
[im 57/97  soft-tissue]
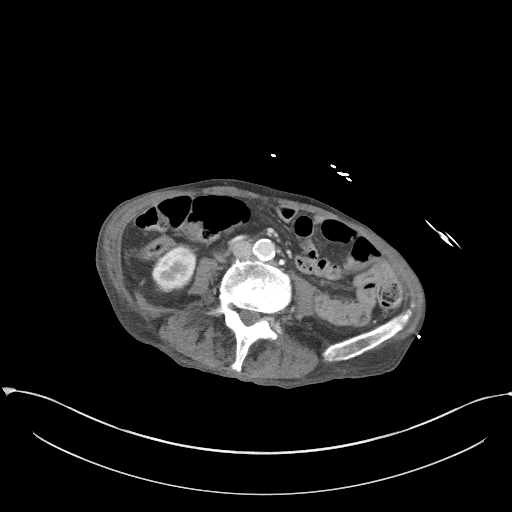
[im 63/97  soft-tissue]
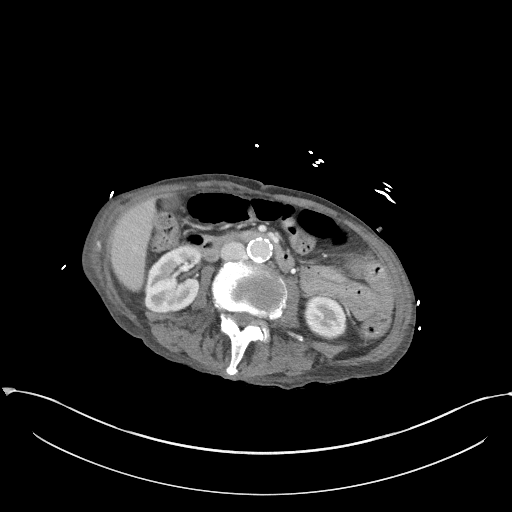
[im 63/97  bone]
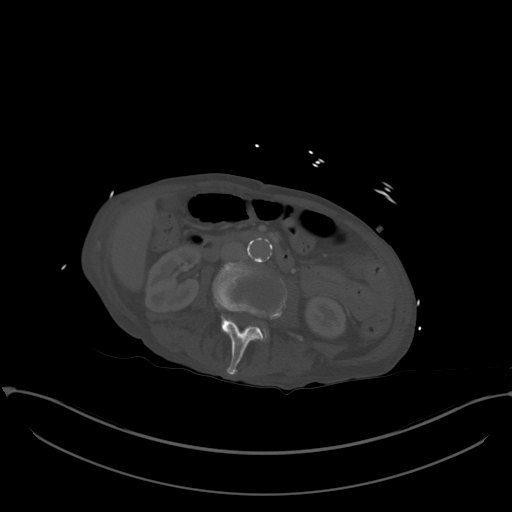
[im 68/97  soft-tissue]
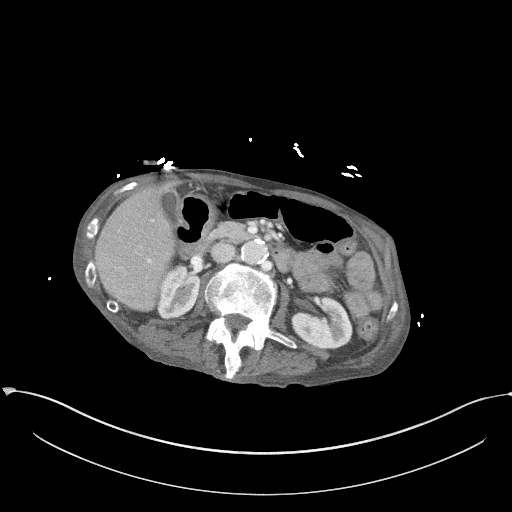
[im 74/97  soft-tissue]
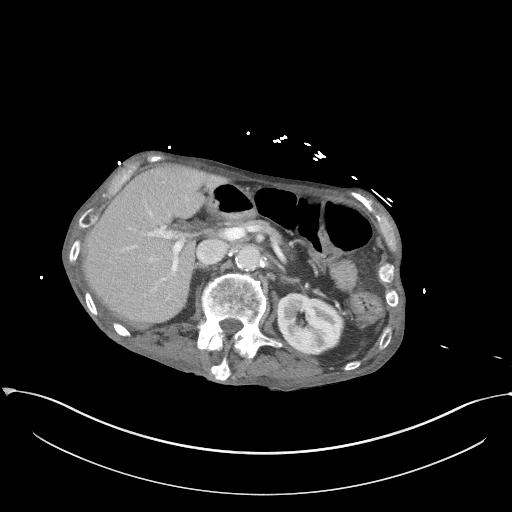
[im 85/97  soft-tissue]
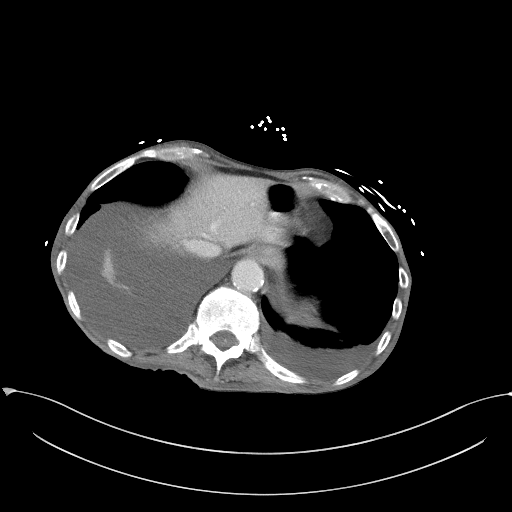
[im 91/97  soft-tissue]
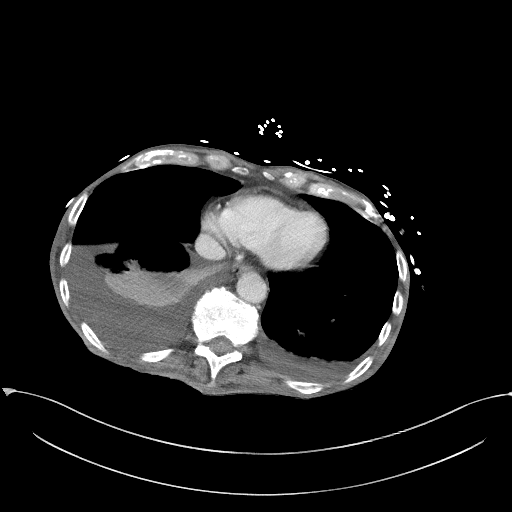

[Series 5: coronal st · coronal · 0.77mm/px · 3 of 75 slices shown]
[im 25/75  soft-tissue]
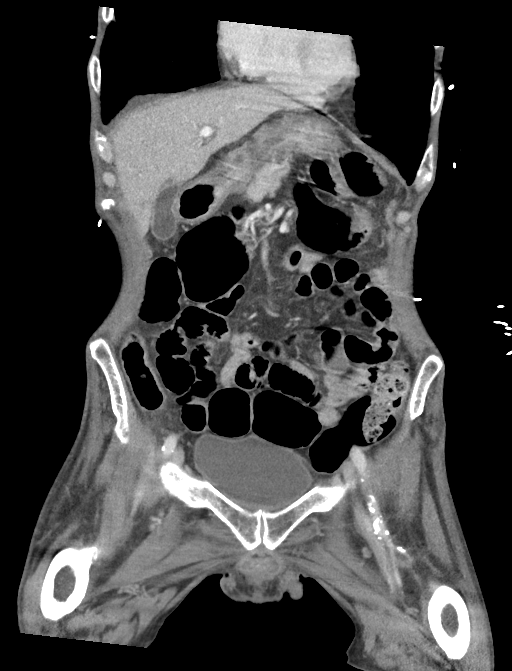
[im 33/75  soft-tissue]
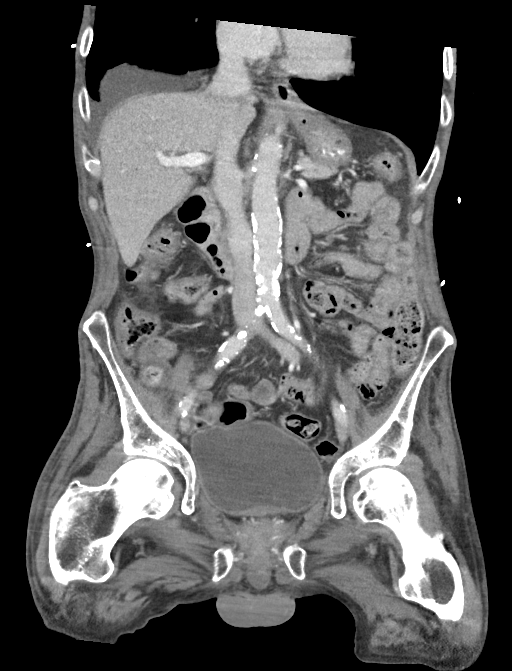
[im 42/75  soft-tissue]
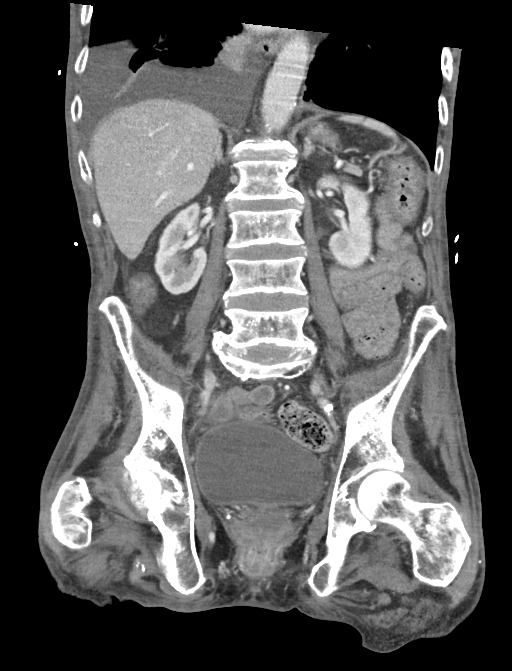

[16 of 46 positions shown; findings below may reference images not displayed]

RADIATION DOSE REDUCTION: This exam was performed according to the
departmental dose-optimization program which includes automated
exposure control, adjustment of the mA and/or kV according to
patient size and/or use of iterative reconstruction technique.

CONTRAST:  75mL OMNIPAQUE IOHEXOL 300 MG/ML  SOLN
FINDINGS: Lower chest: Moderate bilateral pleural effusions with basilar
atelectasis.

Hepatobiliary: No focal liver abnormality is seen. No gallstones,
gallbladder wall thickening, or biliary dilatation.

Pancreas: Unremarkable. No pancreatic ductal dilatation or
surrounding inflammatory changes.

Spleen: Normal in size without focal abnormality.

Adrenals/Urinary Tract: Adrenal glands are unremarkable. Kidneys are
normal, without renal calculi, focal lesion, or hydronephrosis.
Bladder is distended without wall thickening or filling defect.

Stomach/Bowel: Stomach, small bowel, and colon are not abnormally
distended. Diffuse colonic wall thickening predominantly involving
the descending and rectosigmoid colon. Changes likely represent
colitis. Similar appearance to previous study. Appendix is normal.

Vascular/Lymphatic: Aortic atherosclerosis. No enlarged abdominal or
pelvic lymph nodes.

Reproductive: Prostate gland is enlarged.

Other: No free air or free fluid in the abdomen. Atrophic abdominal
wall musculature.

Musculoskeletal: Degenerative changes in the spine.
IMPRESSION: 1. Persistent colonic wall thickening and edema consistent with
colitis.
2. Bilateral pleural effusions with basilar atelectasis.
3. Aortic atherosclerosis.

## 2024-01-10 DIAGNOSIS — K219 Gastro-esophageal reflux disease without esophagitis: Secondary | ICD-10-CM | POA: Diagnosis not present

## 2024-01-10 DIAGNOSIS — J449 Chronic obstructive pulmonary disease, unspecified: Secondary | ICD-10-CM | POA: Diagnosis not present

## 2024-01-10 DIAGNOSIS — I82503 Chronic embolism and thrombosis of unspecified deep veins of lower extremity, bilateral: Secondary | ICD-10-CM | POA: Diagnosis not present

## 2024-01-10 DIAGNOSIS — N1831 Chronic kidney disease, stage 3a: Secondary | ICD-10-CM | POA: Diagnosis not present

## 2024-01-10 DIAGNOSIS — I5032 Chronic diastolic (congestive) heart failure: Secondary | ICD-10-CM | POA: Diagnosis not present

## 2024-01-10 DIAGNOSIS — R296 Repeated falls: Secondary | ICD-10-CM | POA: Diagnosis not present

## 2024-01-10 DIAGNOSIS — J441 Chronic obstructive pulmonary disease with (acute) exacerbation: Secondary | ICD-10-CM | POA: Diagnosis not present

## 2024-01-10 DIAGNOSIS — G458 Other transient cerebral ischemic attacks and related syndromes: Secondary | ICD-10-CM | POA: Diagnosis not present

## 2024-01-10 DIAGNOSIS — G4700 Insomnia, unspecified: Secondary | ICD-10-CM | POA: Diagnosis not present

## 2024-01-10 DIAGNOSIS — K7469 Other cirrhosis of liver: Secondary | ICD-10-CM | POA: Diagnosis not present

## 2024-01-10 DIAGNOSIS — Z681 Body mass index (BMI) 19 or less, adult: Secondary | ICD-10-CM | POA: Diagnosis not present

## 2024-01-10 DIAGNOSIS — E46 Unspecified protein-calorie malnutrition: Secondary | ICD-10-CM | POA: Diagnosis not present

## 2024-01-10 DIAGNOSIS — K5150 Left sided colitis without complications: Secondary | ICD-10-CM | POA: Diagnosis not present

## 2024-01-10 DIAGNOSIS — Z0000 Encounter for general adult medical examination without abnormal findings: Secondary | ICD-10-CM | POA: Diagnosis not present

## 2024-01-10 DIAGNOSIS — M898X1 Other specified disorders of bone, shoulder: Secondary | ICD-10-CM | POA: Diagnosis not present

## 2024-01-17 DIAGNOSIS — Z122 Encounter for screening for malignant neoplasm of respiratory organs: Secondary | ICD-10-CM | POA: Diagnosis not present

## 2024-01-17 DIAGNOSIS — F17210 Nicotine dependence, cigarettes, uncomplicated: Secondary | ICD-10-CM | POA: Diagnosis not present

## 2024-01-17 DIAGNOSIS — Z87891 Personal history of nicotine dependence: Secondary | ICD-10-CM | POA: Diagnosis not present

## 2024-04-16 DIAGNOSIS — K219 Gastro-esophageal reflux disease without esophagitis: Secondary | ICD-10-CM | POA: Diagnosis not present

## 2024-04-16 DIAGNOSIS — Z681 Body mass index (BMI) 19 or less, adult: Secondary | ICD-10-CM | POA: Diagnosis not present

## 2024-04-16 DIAGNOSIS — G4700 Insomnia, unspecified: Secondary | ICD-10-CM | POA: Diagnosis not present

## 2024-04-16 DIAGNOSIS — R296 Repeated falls: Secondary | ICD-10-CM | POA: Diagnosis not present

## 2024-04-16 DIAGNOSIS — I5032 Chronic diastolic (congestive) heart failure: Secondary | ICD-10-CM | POA: Diagnosis not present

## 2024-04-16 DIAGNOSIS — J449 Chronic obstructive pulmonary disease, unspecified: Secondary | ICD-10-CM | POA: Diagnosis not present

## 2024-04-16 DIAGNOSIS — I700 Atherosclerosis of aorta: Secondary | ICD-10-CM | POA: Diagnosis not present

## 2024-04-16 DIAGNOSIS — N1831 Chronic kidney disease, stage 3a: Secondary | ICD-10-CM | POA: Diagnosis not present

## 2024-04-16 DIAGNOSIS — I82503 Chronic embolism and thrombosis of unspecified deep veins of lower extremity, bilateral: Secondary | ICD-10-CM | POA: Diagnosis not present

## 2024-04-16 DIAGNOSIS — E46 Unspecified protein-calorie malnutrition: Secondary | ICD-10-CM | POA: Diagnosis not present

## 2024-04-16 DIAGNOSIS — K7469 Other cirrhosis of liver: Secondary | ICD-10-CM | POA: Diagnosis not present

## 2024-04-16 DIAGNOSIS — Z0000 Encounter for general adult medical examination without abnormal findings: Secondary | ICD-10-CM | POA: Diagnosis not present

## 2024-06-05 DIAGNOSIS — R55 Syncope and collapse: Secondary | ICD-10-CM | POA: Diagnosis not present

## 2024-06-05 DIAGNOSIS — G8191 Hemiplegia, unspecified affecting right dominant side: Secondary | ICD-10-CM | POA: Diagnosis not present

## 2024-06-05 DIAGNOSIS — I951 Orthostatic hypotension: Secondary | ICD-10-CM | POA: Diagnosis not present

## 2024-06-05 DIAGNOSIS — I499 Cardiac arrhythmia, unspecified: Secondary | ICD-10-CM | POA: Diagnosis not present

## 2024-06-05 DIAGNOSIS — D509 Iron deficiency anemia, unspecified: Secondary | ICD-10-CM | POA: Diagnosis not present

## 2024-06-05 DIAGNOSIS — I110 Hypertensive heart disease with heart failure: Secondary | ICD-10-CM | POA: Diagnosis not present

## 2024-06-05 DIAGNOSIS — R509 Fever, unspecified: Secondary | ICD-10-CM | POA: Diagnosis not present

## 2024-06-05 DIAGNOSIS — D519 Vitamin B12 deficiency anemia, unspecified: Secondary | ICD-10-CM | POA: Diagnosis not present

## 2024-06-05 DIAGNOSIS — G459 Transient cerebral ischemic attack, unspecified: Secondary | ICD-10-CM | POA: Diagnosis not present

## 2024-06-05 DIAGNOSIS — R918 Other nonspecific abnormal finding of lung field: Secondary | ICD-10-CM | POA: Diagnosis not present

## 2024-06-05 DIAGNOSIS — E538 Deficiency of other specified B group vitamins: Secondary | ICD-10-CM | POA: Diagnosis not present

## 2024-06-05 DIAGNOSIS — I639 Cerebral infarction, unspecified: Secondary | ICD-10-CM | POA: Diagnosis not present

## 2024-06-05 DIAGNOSIS — F17210 Nicotine dependence, cigarettes, uncomplicated: Secondary | ICD-10-CM | POA: Diagnosis not present

## 2024-06-05 DIAGNOSIS — R531 Weakness: Secondary | ICD-10-CM | POA: Diagnosis not present

## 2024-06-05 DIAGNOSIS — Z8673 Personal history of transient ischemic attack (TIA), and cerebral infarction without residual deficits: Secondary | ICD-10-CM | POA: Diagnosis not present

## 2024-06-05 DIAGNOSIS — I809 Phlebitis and thrombophlebitis of unspecified site: Secondary | ICD-10-CM | POA: Diagnosis not present

## 2024-06-05 DIAGNOSIS — R627 Adult failure to thrive: Secondary | ICD-10-CM | POA: Diagnosis not present

## 2024-06-05 DIAGNOSIS — K219 Gastro-esophageal reflux disease without esophagitis: Secondary | ICD-10-CM | POA: Diagnosis not present

## 2024-06-05 DIAGNOSIS — R29818 Other symptoms and signs involving the nervous system: Secondary | ICD-10-CM | POA: Diagnosis not present

## 2024-06-05 DIAGNOSIS — I5032 Chronic diastolic (congestive) heart failure: Secondary | ICD-10-CM | POA: Diagnosis not present

## 2024-06-05 DIAGNOSIS — Z7409 Other reduced mobility: Secondary | ICD-10-CM | POA: Diagnosis not present

## 2024-06-05 DIAGNOSIS — T675XXA Heat exhaustion, unspecified, initial encounter: Secondary | ICD-10-CM | POA: Diagnosis not present

## 2024-06-05 DIAGNOSIS — I2510 Atherosclerotic heart disease of native coronary artery without angina pectoris: Secondary | ICD-10-CM | POA: Diagnosis not present

## 2024-06-05 DIAGNOSIS — E43 Unspecified severe protein-calorie malnutrition: Secondary | ICD-10-CM | POA: Diagnosis not present

## 2024-06-05 DIAGNOSIS — T6701XS Heatstroke and sunstroke, sequela: Secondary | ICD-10-CM | POA: Diagnosis not present

## 2024-06-05 DIAGNOSIS — E876 Hypokalemia: Secondary | ICD-10-CM | POA: Diagnosis not present

## 2024-06-05 DIAGNOSIS — E559 Vitamin D deficiency, unspecified: Secondary | ICD-10-CM | POA: Diagnosis not present

## 2024-06-05 DIAGNOSIS — D649 Anemia, unspecified: Secondary | ICD-10-CM | POA: Diagnosis not present

## 2024-06-05 DIAGNOSIS — I5022 Chronic systolic (congestive) heart failure: Secondary | ICD-10-CM | POA: Diagnosis not present

## 2024-06-05 DIAGNOSIS — I808 Phlebitis and thrombophlebitis of other sites: Secondary | ICD-10-CM | POA: Diagnosis not present

## 2024-06-05 DIAGNOSIS — R404 Transient alteration of awareness: Secondary | ICD-10-CM | POA: Diagnosis not present

## 2024-06-05 DIAGNOSIS — M4802 Spinal stenosis, cervical region: Secondary | ICD-10-CM | POA: Diagnosis not present

## 2024-06-05 DIAGNOSIS — Z20822 Contact with and (suspected) exposure to covid-19: Secondary | ICD-10-CM | POA: Diagnosis not present

## 2024-06-05 DIAGNOSIS — I9589 Other hypotension: Secondary | ICD-10-CM | POA: Diagnosis not present

## 2024-06-05 DIAGNOSIS — T6701XA Heatstroke and sunstroke, initial encounter: Secondary | ICD-10-CM | POA: Diagnosis not present

## 2024-06-05 DIAGNOSIS — I6621 Occlusion and stenosis of right posterior cerebral artery: Secondary | ICD-10-CM | POA: Diagnosis not present

## 2024-06-05 DIAGNOSIS — I6389 Other cerebral infarction: Secondary | ICD-10-CM | POA: Diagnosis not present

## 2024-06-05 DIAGNOSIS — J440 Chronic obstructive pulmonary disease with acute lower respiratory infection: Secondary | ICD-10-CM | POA: Diagnosis not present

## 2024-06-05 DIAGNOSIS — R059 Cough, unspecified: Secondary | ICD-10-CM | POA: Diagnosis not present

## 2024-06-05 DIAGNOSIS — E871 Hypo-osmolality and hyponatremia: Secondary | ICD-10-CM | POA: Diagnosis not present

## 2024-06-05 DIAGNOSIS — Z743 Need for continuous supervision: Secondary | ICD-10-CM | POA: Diagnosis not present

## 2024-06-05 DIAGNOSIS — R29898 Other symptoms and signs involving the musculoskeletal system: Secondary | ICD-10-CM | POA: Diagnosis not present

## 2024-06-05 DIAGNOSIS — M4803 Spinal stenosis, cervicothoracic region: Secondary | ICD-10-CM | POA: Diagnosis not present

## 2024-06-05 DIAGNOSIS — J449 Chronic obstructive pulmonary disease, unspecified: Secondary | ICD-10-CM | POA: Diagnosis not present

## 2024-06-05 DIAGNOSIS — E88A Wasting disease (syndrome) due to underlying condition: Secondary | ICD-10-CM | POA: Diagnosis not present

## 2024-06-05 DIAGNOSIS — G4700 Insomnia, unspecified: Secondary | ICD-10-CM | POA: Diagnosis not present

## 2024-06-05 DIAGNOSIS — Z681 Body mass index (BMI) 19 or less, adult: Secondary | ICD-10-CM | POA: Diagnosis not present

## 2024-06-05 DIAGNOSIS — T6709XA Other heatstroke and sunstroke, initial encounter: Secondary | ICD-10-CM | POA: Diagnosis not present

## 2024-06-05 DIAGNOSIS — E7800 Pure hypercholesterolemia, unspecified: Secondary | ICD-10-CM | POA: Diagnosis not present

## 2024-06-05 DIAGNOSIS — Z0189 Encounter for other specified special examinations: Secondary | ICD-10-CM | POA: Diagnosis not present

## 2024-06-06 DIAGNOSIS — T6701XA Heatstroke and sunstroke, initial encounter: Secondary | ICD-10-CM | POA: Diagnosis not present

## 2024-06-07 DIAGNOSIS — I5032 Chronic diastolic (congestive) heart failure: Secondary | ICD-10-CM | POA: Diagnosis not present

## 2024-06-07 DIAGNOSIS — J449 Chronic obstructive pulmonary disease, unspecified: Secondary | ICD-10-CM | POA: Diagnosis not present

## 2024-06-07 DIAGNOSIS — D649 Anemia, unspecified: Secondary | ICD-10-CM | POA: Diagnosis not present

## 2024-06-07 DIAGNOSIS — T6701XA Heatstroke and sunstroke, initial encounter: Secondary | ICD-10-CM | POA: Diagnosis not present

## 2024-06-08 DIAGNOSIS — I9589 Other hypotension: Secondary | ICD-10-CM | POA: Diagnosis not present

## 2024-06-08 DIAGNOSIS — K219 Gastro-esophageal reflux disease without esophagitis: Secondary | ICD-10-CM | POA: Diagnosis not present

## 2024-06-08 DIAGNOSIS — Z8673 Personal history of transient ischemic attack (TIA), and cerebral infarction without residual deficits: Secondary | ICD-10-CM | POA: Diagnosis not present

## 2024-06-08 DIAGNOSIS — I5032 Chronic diastolic (congestive) heart failure: Secondary | ICD-10-CM | POA: Diagnosis not present

## 2024-06-08 DIAGNOSIS — J449 Chronic obstructive pulmonary disease, unspecified: Secondary | ICD-10-CM | POA: Diagnosis not present

## 2024-06-08 DIAGNOSIS — D649 Anemia, unspecified: Secondary | ICD-10-CM | POA: Diagnosis not present

## 2024-06-08 DIAGNOSIS — T6701XA Heatstroke and sunstroke, initial encounter: Secondary | ICD-10-CM | POA: Diagnosis not present

## 2024-06-08 DIAGNOSIS — E538 Deficiency of other specified B group vitamins: Secondary | ICD-10-CM | POA: Diagnosis not present

## 2024-06-08 DIAGNOSIS — Z7409 Other reduced mobility: Secondary | ICD-10-CM | POA: Diagnosis not present

## 2024-06-10 DIAGNOSIS — Z8673 Personal history of transient ischemic attack (TIA), and cerebral infarction without residual deficits: Secondary | ICD-10-CM | POA: Diagnosis not present

## 2024-06-10 DIAGNOSIS — T6701XA Heatstroke and sunstroke, initial encounter: Secondary | ICD-10-CM | POA: Diagnosis not present

## 2024-06-10 DIAGNOSIS — D649 Anemia, unspecified: Secondary | ICD-10-CM | POA: Diagnosis not present

## 2024-06-10 DIAGNOSIS — I5032 Chronic diastolic (congestive) heart failure: Secondary | ICD-10-CM | POA: Diagnosis not present

## 2024-06-14 DIAGNOSIS — R5381 Other malaise: Secondary | ICD-10-CM | POA: Diagnosis not present

## 2024-06-14 DIAGNOSIS — M6281 Muscle weakness (generalized): Secondary | ICD-10-CM | POA: Diagnosis not present

## 2024-06-14 DIAGNOSIS — E611 Iron deficiency: Secondary | ICD-10-CM | POA: Diagnosis not present

## 2024-06-14 DIAGNOSIS — R627 Adult failure to thrive: Secondary | ICD-10-CM | POA: Diagnosis not present

## 2024-06-14 DIAGNOSIS — R2689 Other abnormalities of gait and mobility: Secondary | ICD-10-CM | POA: Diagnosis not present

## 2024-06-14 DIAGNOSIS — Z86718 Personal history of other venous thrombosis and embolism: Secondary | ICD-10-CM | POA: Diagnosis not present

## 2024-06-14 DIAGNOSIS — K219 Gastro-esophageal reflux disease without esophagitis: Secondary | ICD-10-CM | POA: Diagnosis not present

## 2024-06-14 DIAGNOSIS — T6701XD Heatstroke and sunstroke, subsequent encounter: Secondary | ICD-10-CM | POA: Diagnosis not present

## 2024-06-14 DIAGNOSIS — I9589 Other hypotension: Secondary | ICD-10-CM | POA: Diagnosis not present

## 2024-06-14 DIAGNOSIS — M4802 Spinal stenosis, cervical region: Secondary | ICD-10-CM | POA: Diagnosis not present

## 2024-06-14 DIAGNOSIS — E46 Unspecified protein-calorie malnutrition: Secondary | ICD-10-CM | POA: Diagnosis not present

## 2024-06-14 DIAGNOSIS — D518 Other vitamin B12 deficiency anemias: Secondary | ICD-10-CM | POA: Diagnosis not present

## 2024-06-14 DIAGNOSIS — G4700 Insomnia, unspecified: Secondary | ICD-10-CM | POA: Diagnosis not present

## 2024-06-14 DIAGNOSIS — R471 Dysarthria and anarthria: Secondary | ICD-10-CM | POA: Diagnosis not present

## 2024-06-14 DIAGNOSIS — I808 Phlebitis and thrombophlebitis of other sites: Secondary | ICD-10-CM | POA: Diagnosis not present

## 2024-06-14 DIAGNOSIS — I5032 Chronic diastolic (congestive) heart failure: Secondary | ICD-10-CM | POA: Diagnosis not present

## 2024-06-14 DIAGNOSIS — L551 Sunburn of second degree: Secondary | ICD-10-CM | POA: Diagnosis not present

## 2024-06-18 DIAGNOSIS — R5381 Other malaise: Secondary | ICD-10-CM | POA: Diagnosis not present

## 2024-06-18 DIAGNOSIS — T6701XD Heatstroke and sunstroke, subsequent encounter: Secondary | ICD-10-CM | POA: Diagnosis not present

## 2024-06-18 DIAGNOSIS — I5032 Chronic diastolic (congestive) heart failure: Secondary | ICD-10-CM | POA: Diagnosis not present

## 2024-06-19 DIAGNOSIS — I5032 Chronic diastolic (congestive) heart failure: Secondary | ICD-10-CM | POA: Diagnosis not present

## 2024-06-19 DIAGNOSIS — R627 Adult failure to thrive: Secondary | ICD-10-CM | POA: Diagnosis not present

## 2024-06-19 DIAGNOSIS — Z86718 Personal history of other venous thrombosis and embolism: Secondary | ICD-10-CM | POA: Diagnosis not present

## 2024-06-19 DIAGNOSIS — K219 Gastro-esophageal reflux disease without esophagitis: Secondary | ICD-10-CM | POA: Diagnosis not present

## 2024-06-19 DIAGNOSIS — T6701XD Heatstroke and sunstroke, subsequent encounter: Secondary | ICD-10-CM | POA: Diagnosis not present

## 2024-06-19 DIAGNOSIS — E611 Iron deficiency: Secondary | ICD-10-CM | POA: Diagnosis not present

## 2024-06-19 DIAGNOSIS — M4802 Spinal stenosis, cervical region: Secondary | ICD-10-CM | POA: Diagnosis not present

## 2024-06-19 DIAGNOSIS — G4700 Insomnia, unspecified: Secondary | ICD-10-CM | POA: Diagnosis not present

## 2024-06-19 DIAGNOSIS — I808 Phlebitis and thrombophlebitis of other sites: Secondary | ICD-10-CM | POA: Diagnosis not present

## 2024-06-20 DIAGNOSIS — E611 Iron deficiency: Secondary | ICD-10-CM | POA: Diagnosis not present

## 2024-06-20 DIAGNOSIS — I5032 Chronic diastolic (congestive) heart failure: Secondary | ICD-10-CM | POA: Diagnosis not present

## 2024-06-20 DIAGNOSIS — D649 Anemia, unspecified: Secondary | ICD-10-CM | POA: Diagnosis not present

## 2024-06-21 DIAGNOSIS — D649 Anemia, unspecified: Secondary | ICD-10-CM | POA: Diagnosis not present

## 2024-06-21 DIAGNOSIS — I808 Phlebitis and thrombophlebitis of other sites: Secondary | ICD-10-CM | POA: Diagnosis not present

## 2024-06-21 DIAGNOSIS — R627 Adult failure to thrive: Secondary | ICD-10-CM | POA: Diagnosis not present

## 2024-06-21 DIAGNOSIS — K219 Gastro-esophageal reflux disease without esophagitis: Secondary | ICD-10-CM | POA: Diagnosis not present

## 2024-06-21 DIAGNOSIS — T6701XD Heatstroke and sunstroke, subsequent encounter: Secondary | ICD-10-CM | POA: Diagnosis not present

## 2024-06-21 DIAGNOSIS — E611 Iron deficiency: Secondary | ICD-10-CM | POA: Diagnosis not present

## 2024-06-21 DIAGNOSIS — I5032 Chronic diastolic (congestive) heart failure: Secondary | ICD-10-CM | POA: Diagnosis not present

## 2024-06-21 DIAGNOSIS — G4700 Insomnia, unspecified: Secondary | ICD-10-CM | POA: Diagnosis not present

## 2024-06-21 DIAGNOSIS — Z86718 Personal history of other venous thrombosis and embolism: Secondary | ICD-10-CM | POA: Diagnosis not present

## 2024-06-25 DIAGNOSIS — I5032 Chronic diastolic (congestive) heart failure: Secondary | ICD-10-CM | POA: Diagnosis not present

## 2024-06-25 DIAGNOSIS — G4700 Insomnia, unspecified: Secondary | ICD-10-CM | POA: Diagnosis not present

## 2024-06-25 DIAGNOSIS — N1831 Chronic kidney disease, stage 3a: Secondary | ICD-10-CM | POA: Diagnosis not present

## 2024-06-25 DIAGNOSIS — Z87440 Personal history of urinary (tract) infections: Secondary | ICD-10-CM | POA: Diagnosis not present

## 2024-06-25 DIAGNOSIS — K219 Gastro-esophageal reflux disease without esophagitis: Secondary | ICD-10-CM | POA: Diagnosis not present

## 2024-06-25 DIAGNOSIS — T24211D Burn of second degree of right thigh, subsequent encounter: Secondary | ICD-10-CM | POA: Diagnosis not present

## 2024-06-25 DIAGNOSIS — M898X1 Other specified disorders of bone, shoulder: Secondary | ICD-10-CM | POA: Diagnosis not present

## 2024-06-25 DIAGNOSIS — I700 Atherosclerosis of aorta: Secondary | ICD-10-CM | POA: Diagnosis not present

## 2024-06-25 DIAGNOSIS — E46 Unspecified protein-calorie malnutrition: Secondary | ICD-10-CM | POA: Diagnosis not present

## 2024-06-25 DIAGNOSIS — Z556 Problems related to health literacy: Secondary | ICD-10-CM | POA: Diagnosis not present

## 2024-06-25 DIAGNOSIS — F17200 Nicotine dependence, unspecified, uncomplicated: Secondary | ICD-10-CM | POA: Diagnosis not present

## 2024-06-25 DIAGNOSIS — K7469 Other cirrhosis of liver: Secondary | ICD-10-CM | POA: Diagnosis not present

## 2024-06-25 DIAGNOSIS — J449 Chronic obstructive pulmonary disease, unspecified: Secondary | ICD-10-CM | POA: Diagnosis not present

## 2024-06-25 DIAGNOSIS — Z7982 Long term (current) use of aspirin: Secondary | ICD-10-CM | POA: Diagnosis not present

## 2024-06-25 DIAGNOSIS — M4802 Spinal stenosis, cervical region: Secondary | ICD-10-CM | POA: Diagnosis not present

## 2024-06-25 DIAGNOSIS — D631 Anemia in chronic kidney disease: Secondary | ICD-10-CM | POA: Diagnosis not present

## 2024-06-25 DIAGNOSIS — E611 Iron deficiency: Secondary | ICD-10-CM | POA: Diagnosis not present

## 2024-06-25 DIAGNOSIS — E538 Deficiency of other specified B group vitamins: Secondary | ICD-10-CM | POA: Diagnosis not present

## 2024-06-25 DIAGNOSIS — Z8673 Personal history of transient ischemic attack (TIA), and cerebral infarction without residual deficits: Secondary | ICD-10-CM | POA: Diagnosis not present

## 2024-06-25 DIAGNOSIS — Z7901 Long term (current) use of anticoagulants: Secondary | ICD-10-CM | POA: Diagnosis not present

## 2024-06-25 DIAGNOSIS — I130 Hypertensive heart and chronic kidney disease with heart failure and stage 1 through stage 4 chronic kidney disease, or unspecified chronic kidney disease: Secondary | ICD-10-CM | POA: Diagnosis not present

## 2024-06-25 DIAGNOSIS — T6701XD Heatstroke and sunstroke, subsequent encounter: Secondary | ICD-10-CM | POA: Diagnosis not present

## 2024-06-25 DIAGNOSIS — Z86718 Personal history of other venous thrombosis and embolism: Secondary | ICD-10-CM | POA: Diagnosis not present

## 2024-06-28 DIAGNOSIS — J449 Chronic obstructive pulmonary disease, unspecified: Secondary | ICD-10-CM | POA: Diagnosis not present

## 2024-06-28 DIAGNOSIS — E611 Iron deficiency: Secondary | ICD-10-CM | POA: Diagnosis not present

## 2024-06-28 DIAGNOSIS — D631 Anemia in chronic kidney disease: Secondary | ICD-10-CM | POA: Diagnosis not present

## 2024-06-28 DIAGNOSIS — Z87440 Personal history of urinary (tract) infections: Secondary | ICD-10-CM | POA: Diagnosis not present

## 2024-06-28 DIAGNOSIS — I5032 Chronic diastolic (congestive) heart failure: Secondary | ICD-10-CM | POA: Diagnosis not present

## 2024-06-28 DIAGNOSIS — Z7982 Long term (current) use of aspirin: Secondary | ICD-10-CM | POA: Diagnosis not present

## 2024-06-28 DIAGNOSIS — M4802 Spinal stenosis, cervical region: Secondary | ICD-10-CM | POA: Diagnosis not present

## 2024-06-28 DIAGNOSIS — F17200 Nicotine dependence, unspecified, uncomplicated: Secondary | ICD-10-CM | POA: Diagnosis not present

## 2024-06-28 DIAGNOSIS — K219 Gastro-esophageal reflux disease without esophagitis: Secondary | ICD-10-CM | POA: Diagnosis not present

## 2024-06-28 DIAGNOSIS — T6701XD Heatstroke and sunstroke, subsequent encounter: Secondary | ICD-10-CM | POA: Diagnosis not present

## 2024-06-28 DIAGNOSIS — I700 Atherosclerosis of aorta: Secondary | ICD-10-CM | POA: Diagnosis not present

## 2024-06-28 DIAGNOSIS — T24211D Burn of second degree of right thigh, subsequent encounter: Secondary | ICD-10-CM | POA: Diagnosis not present

## 2024-06-28 DIAGNOSIS — E538 Deficiency of other specified B group vitamins: Secondary | ICD-10-CM | POA: Diagnosis not present

## 2024-06-28 DIAGNOSIS — Z86718 Personal history of other venous thrombosis and embolism: Secondary | ICD-10-CM | POA: Diagnosis not present

## 2024-06-28 DIAGNOSIS — G4700 Insomnia, unspecified: Secondary | ICD-10-CM | POA: Diagnosis not present

## 2024-06-28 DIAGNOSIS — K7469 Other cirrhosis of liver: Secondary | ICD-10-CM | POA: Diagnosis not present

## 2024-06-28 DIAGNOSIS — Z8673 Personal history of transient ischemic attack (TIA), and cerebral infarction without residual deficits: Secondary | ICD-10-CM | POA: Diagnosis not present

## 2024-06-28 DIAGNOSIS — M898X1 Other specified disorders of bone, shoulder: Secondary | ICD-10-CM | POA: Diagnosis not present

## 2024-06-28 DIAGNOSIS — E46 Unspecified protein-calorie malnutrition: Secondary | ICD-10-CM | POA: Diagnosis not present

## 2024-06-28 DIAGNOSIS — I130 Hypertensive heart and chronic kidney disease with heart failure and stage 1 through stage 4 chronic kidney disease, or unspecified chronic kidney disease: Secondary | ICD-10-CM | POA: Diagnosis not present

## 2024-06-28 DIAGNOSIS — Z556 Problems related to health literacy: Secondary | ICD-10-CM | POA: Diagnosis not present

## 2024-06-28 DIAGNOSIS — N1831 Chronic kidney disease, stage 3a: Secondary | ICD-10-CM | POA: Diagnosis not present

## 2024-06-28 DIAGNOSIS — Z7901 Long term (current) use of anticoagulants: Secondary | ICD-10-CM | POA: Diagnosis not present

## 2024-07-02 DIAGNOSIS — Z7982 Long term (current) use of aspirin: Secondary | ICD-10-CM | POA: Diagnosis not present

## 2024-07-02 DIAGNOSIS — J449 Chronic obstructive pulmonary disease, unspecified: Secondary | ICD-10-CM | POA: Diagnosis not present

## 2024-07-02 DIAGNOSIS — I130 Hypertensive heart and chronic kidney disease with heart failure and stage 1 through stage 4 chronic kidney disease, or unspecified chronic kidney disease: Secondary | ICD-10-CM | POA: Diagnosis not present

## 2024-07-02 DIAGNOSIS — Z556 Problems related to health literacy: Secondary | ICD-10-CM | POA: Diagnosis not present

## 2024-07-02 DIAGNOSIS — E538 Deficiency of other specified B group vitamins: Secondary | ICD-10-CM | POA: Diagnosis not present

## 2024-07-02 DIAGNOSIS — N1831 Chronic kidney disease, stage 3a: Secondary | ICD-10-CM | POA: Diagnosis not present

## 2024-07-02 DIAGNOSIS — T24211D Burn of second degree of right thigh, subsequent encounter: Secondary | ICD-10-CM | POA: Diagnosis not present

## 2024-07-02 DIAGNOSIS — I700 Atherosclerosis of aorta: Secondary | ICD-10-CM | POA: Diagnosis not present

## 2024-07-02 DIAGNOSIS — G4700 Insomnia, unspecified: Secondary | ICD-10-CM | POA: Diagnosis not present

## 2024-07-02 DIAGNOSIS — F17200 Nicotine dependence, unspecified, uncomplicated: Secondary | ICD-10-CM | POA: Diagnosis not present

## 2024-07-02 DIAGNOSIS — I5032 Chronic diastolic (congestive) heart failure: Secondary | ICD-10-CM | POA: Diagnosis not present

## 2024-07-02 DIAGNOSIS — M898X1 Other specified disorders of bone, shoulder: Secondary | ICD-10-CM | POA: Diagnosis not present

## 2024-07-02 DIAGNOSIS — T6701XD Heatstroke and sunstroke, subsequent encounter: Secondary | ICD-10-CM | POA: Diagnosis not present

## 2024-07-02 DIAGNOSIS — K7469 Other cirrhosis of liver: Secondary | ICD-10-CM | POA: Diagnosis not present

## 2024-07-02 DIAGNOSIS — Z86718 Personal history of other venous thrombosis and embolism: Secondary | ICD-10-CM | POA: Diagnosis not present

## 2024-07-02 DIAGNOSIS — Z8673 Personal history of transient ischemic attack (TIA), and cerebral infarction without residual deficits: Secondary | ICD-10-CM | POA: Diagnosis not present

## 2024-07-02 DIAGNOSIS — Z681 Body mass index (BMI) 19 or less, adult: Secondary | ICD-10-CM | POA: Diagnosis not present

## 2024-07-02 DIAGNOSIS — K219 Gastro-esophageal reflux disease without esophagitis: Secondary | ICD-10-CM | POA: Diagnosis not present

## 2024-07-02 DIAGNOSIS — D631 Anemia in chronic kidney disease: Secondary | ICD-10-CM | POA: Diagnosis not present

## 2024-07-02 DIAGNOSIS — M4802 Spinal stenosis, cervical region: Secondary | ICD-10-CM | POA: Diagnosis not present

## 2024-07-02 DIAGNOSIS — Z87440 Personal history of urinary (tract) infections: Secondary | ICD-10-CM | POA: Diagnosis not present

## 2024-07-02 DIAGNOSIS — E611 Iron deficiency: Secondary | ICD-10-CM | POA: Diagnosis not present

## 2024-07-02 DIAGNOSIS — Z7901 Long term (current) use of anticoagulants: Secondary | ICD-10-CM | POA: Diagnosis not present

## 2024-07-02 DIAGNOSIS — E46 Unspecified protein-calorie malnutrition: Secondary | ICD-10-CM | POA: Diagnosis not present

## 2024-07-03 DIAGNOSIS — I700 Atherosclerosis of aorta: Secondary | ICD-10-CM | POA: Diagnosis not present

## 2024-07-03 DIAGNOSIS — Z87440 Personal history of urinary (tract) infections: Secondary | ICD-10-CM | POA: Diagnosis not present

## 2024-07-03 DIAGNOSIS — N1831 Chronic kidney disease, stage 3a: Secondary | ICD-10-CM | POA: Diagnosis not present

## 2024-07-03 DIAGNOSIS — D631 Anemia in chronic kidney disease: Secondary | ICD-10-CM | POA: Diagnosis not present

## 2024-07-03 DIAGNOSIS — T6701XD Heatstroke and sunstroke, subsequent encounter: Secondary | ICD-10-CM | POA: Diagnosis not present

## 2024-07-03 DIAGNOSIS — J449 Chronic obstructive pulmonary disease, unspecified: Secondary | ICD-10-CM | POA: Diagnosis not present

## 2024-07-03 DIAGNOSIS — T24211D Burn of second degree of right thigh, subsequent encounter: Secondary | ICD-10-CM | POA: Diagnosis not present

## 2024-07-03 DIAGNOSIS — E611 Iron deficiency: Secondary | ICD-10-CM | POA: Diagnosis not present

## 2024-07-03 DIAGNOSIS — Z86718 Personal history of other venous thrombosis and embolism: Secondary | ICD-10-CM | POA: Diagnosis not present

## 2024-07-03 DIAGNOSIS — G4700 Insomnia, unspecified: Secondary | ICD-10-CM | POA: Diagnosis not present

## 2024-07-03 DIAGNOSIS — Z8673 Personal history of transient ischemic attack (TIA), and cerebral infarction without residual deficits: Secondary | ICD-10-CM | POA: Diagnosis not present

## 2024-07-03 DIAGNOSIS — Z7982 Long term (current) use of aspirin: Secondary | ICD-10-CM | POA: Diagnosis not present

## 2024-07-03 DIAGNOSIS — M4802 Spinal stenosis, cervical region: Secondary | ICD-10-CM | POA: Diagnosis not present

## 2024-07-03 DIAGNOSIS — M898X1 Other specified disorders of bone, shoulder: Secondary | ICD-10-CM | POA: Diagnosis not present

## 2024-07-03 DIAGNOSIS — E538 Deficiency of other specified B group vitamins: Secondary | ICD-10-CM | POA: Diagnosis not present

## 2024-07-03 DIAGNOSIS — Z7901 Long term (current) use of anticoagulants: Secondary | ICD-10-CM | POA: Diagnosis not present

## 2024-07-03 DIAGNOSIS — Z556 Problems related to health literacy: Secondary | ICD-10-CM | POA: Diagnosis not present

## 2024-07-03 DIAGNOSIS — K7469 Other cirrhosis of liver: Secondary | ICD-10-CM | POA: Diagnosis not present

## 2024-07-03 DIAGNOSIS — F17200 Nicotine dependence, unspecified, uncomplicated: Secondary | ICD-10-CM | POA: Diagnosis not present

## 2024-07-03 DIAGNOSIS — E46 Unspecified protein-calorie malnutrition: Secondary | ICD-10-CM | POA: Diagnosis not present

## 2024-07-03 DIAGNOSIS — I5032 Chronic diastolic (congestive) heart failure: Secondary | ICD-10-CM | POA: Diagnosis not present

## 2024-07-03 DIAGNOSIS — K219 Gastro-esophageal reflux disease without esophagitis: Secondary | ICD-10-CM | POA: Diagnosis not present

## 2024-07-03 DIAGNOSIS — I130 Hypertensive heart and chronic kidney disease with heart failure and stage 1 through stage 4 chronic kidney disease, or unspecified chronic kidney disease: Secondary | ICD-10-CM | POA: Diagnosis not present

## 2024-07-04 DIAGNOSIS — D631 Anemia in chronic kidney disease: Secondary | ICD-10-CM | POA: Diagnosis not present

## 2024-07-04 DIAGNOSIS — Z556 Problems related to health literacy: Secondary | ICD-10-CM | POA: Diagnosis not present

## 2024-07-04 DIAGNOSIS — K219 Gastro-esophageal reflux disease without esophagitis: Secondary | ICD-10-CM | POA: Diagnosis not present

## 2024-07-04 DIAGNOSIS — Z7901 Long term (current) use of anticoagulants: Secondary | ICD-10-CM | POA: Diagnosis not present

## 2024-07-04 DIAGNOSIS — Z7982 Long term (current) use of aspirin: Secondary | ICD-10-CM | POA: Diagnosis not present

## 2024-07-04 DIAGNOSIS — I130 Hypertensive heart and chronic kidney disease with heart failure and stage 1 through stage 4 chronic kidney disease, or unspecified chronic kidney disease: Secondary | ICD-10-CM | POA: Diagnosis not present

## 2024-07-04 DIAGNOSIS — I5032 Chronic diastolic (congestive) heart failure: Secondary | ICD-10-CM | POA: Diagnosis not present

## 2024-07-04 DIAGNOSIS — T6701XD Heatstroke and sunstroke, subsequent encounter: Secondary | ICD-10-CM | POA: Diagnosis not present

## 2024-07-04 DIAGNOSIS — E538 Deficiency of other specified B group vitamins: Secondary | ICD-10-CM | POA: Diagnosis not present

## 2024-07-04 DIAGNOSIS — Z86718 Personal history of other venous thrombosis and embolism: Secondary | ICD-10-CM | POA: Diagnosis not present

## 2024-07-04 DIAGNOSIS — T24211D Burn of second degree of right thigh, subsequent encounter: Secondary | ICD-10-CM | POA: Diagnosis not present

## 2024-07-04 DIAGNOSIS — E611 Iron deficiency: Secondary | ICD-10-CM | POA: Diagnosis not present

## 2024-07-04 DIAGNOSIS — G4700 Insomnia, unspecified: Secondary | ICD-10-CM | POA: Diagnosis not present

## 2024-07-04 DIAGNOSIS — J449 Chronic obstructive pulmonary disease, unspecified: Secondary | ICD-10-CM | POA: Diagnosis not present

## 2024-07-04 DIAGNOSIS — E46 Unspecified protein-calorie malnutrition: Secondary | ICD-10-CM | POA: Diagnosis not present

## 2024-07-04 DIAGNOSIS — F17200 Nicotine dependence, unspecified, uncomplicated: Secondary | ICD-10-CM | POA: Diagnosis not present

## 2024-07-04 DIAGNOSIS — K7469 Other cirrhosis of liver: Secondary | ICD-10-CM | POA: Diagnosis not present

## 2024-07-04 DIAGNOSIS — M4802 Spinal stenosis, cervical region: Secondary | ICD-10-CM | POA: Diagnosis not present

## 2024-07-04 DIAGNOSIS — Z8673 Personal history of transient ischemic attack (TIA), and cerebral infarction without residual deficits: Secondary | ICD-10-CM | POA: Diagnosis not present

## 2024-07-04 DIAGNOSIS — I700 Atherosclerosis of aorta: Secondary | ICD-10-CM | POA: Diagnosis not present

## 2024-07-04 DIAGNOSIS — Z87440 Personal history of urinary (tract) infections: Secondary | ICD-10-CM | POA: Diagnosis not present

## 2024-07-04 DIAGNOSIS — M898X1 Other specified disorders of bone, shoulder: Secondary | ICD-10-CM | POA: Diagnosis not present

## 2024-07-04 DIAGNOSIS — N1831 Chronic kidney disease, stage 3a: Secondary | ICD-10-CM | POA: Diagnosis not present

## 2024-07-09 DIAGNOSIS — Z87440 Personal history of urinary (tract) infections: Secondary | ICD-10-CM | POA: Diagnosis not present

## 2024-07-09 DIAGNOSIS — I5032 Chronic diastolic (congestive) heart failure: Secondary | ICD-10-CM | POA: Diagnosis not present

## 2024-07-09 DIAGNOSIS — E46 Unspecified protein-calorie malnutrition: Secondary | ICD-10-CM | POA: Diagnosis not present

## 2024-07-09 DIAGNOSIS — M4802 Spinal stenosis, cervical region: Secondary | ICD-10-CM | POA: Diagnosis not present

## 2024-07-09 DIAGNOSIS — T24211D Burn of second degree of right thigh, subsequent encounter: Secondary | ICD-10-CM | POA: Diagnosis not present

## 2024-07-09 DIAGNOSIS — E538 Deficiency of other specified B group vitamins: Secondary | ICD-10-CM | POA: Diagnosis not present

## 2024-07-09 DIAGNOSIS — Z86718 Personal history of other venous thrombosis and embolism: Secondary | ICD-10-CM | POA: Diagnosis not present

## 2024-07-09 DIAGNOSIS — K7469 Other cirrhosis of liver: Secondary | ICD-10-CM | POA: Diagnosis not present

## 2024-07-09 DIAGNOSIS — Z7901 Long term (current) use of anticoagulants: Secondary | ICD-10-CM | POA: Diagnosis not present

## 2024-07-09 DIAGNOSIS — M898X1 Other specified disorders of bone, shoulder: Secondary | ICD-10-CM | POA: Diagnosis not present

## 2024-07-09 DIAGNOSIS — Z7982 Long term (current) use of aspirin: Secondary | ICD-10-CM | POA: Diagnosis not present

## 2024-07-09 DIAGNOSIS — F17200 Nicotine dependence, unspecified, uncomplicated: Secondary | ICD-10-CM | POA: Diagnosis not present

## 2024-07-09 DIAGNOSIS — T6701XD Heatstroke and sunstroke, subsequent encounter: Secondary | ICD-10-CM | POA: Diagnosis not present

## 2024-07-09 DIAGNOSIS — I130 Hypertensive heart and chronic kidney disease with heart failure and stage 1 through stage 4 chronic kidney disease, or unspecified chronic kidney disease: Secondary | ICD-10-CM | POA: Diagnosis not present

## 2024-07-09 DIAGNOSIS — Z8673 Personal history of transient ischemic attack (TIA), and cerebral infarction without residual deficits: Secondary | ICD-10-CM | POA: Diagnosis not present

## 2024-07-09 DIAGNOSIS — J449 Chronic obstructive pulmonary disease, unspecified: Secondary | ICD-10-CM | POA: Diagnosis not present

## 2024-07-09 DIAGNOSIS — E611 Iron deficiency: Secondary | ICD-10-CM | POA: Diagnosis not present

## 2024-07-09 DIAGNOSIS — Z556 Problems related to health literacy: Secondary | ICD-10-CM | POA: Diagnosis not present

## 2024-07-09 DIAGNOSIS — N1831 Chronic kidney disease, stage 3a: Secondary | ICD-10-CM | POA: Diagnosis not present

## 2024-07-09 DIAGNOSIS — G4700 Insomnia, unspecified: Secondary | ICD-10-CM | POA: Diagnosis not present

## 2024-07-09 DIAGNOSIS — D631 Anemia in chronic kidney disease: Secondary | ICD-10-CM | POA: Diagnosis not present

## 2024-07-09 DIAGNOSIS — K219 Gastro-esophageal reflux disease without esophagitis: Secondary | ICD-10-CM | POA: Diagnosis not present

## 2024-07-09 DIAGNOSIS — I700 Atherosclerosis of aorta: Secondary | ICD-10-CM | POA: Diagnosis not present

## 2024-07-10 DIAGNOSIS — R29818 Other symptoms and signs involving the nervous system: Secondary | ICD-10-CM | POA: Diagnosis not present

## 2024-07-10 DIAGNOSIS — R627 Adult failure to thrive: Secondary | ICD-10-CM | POA: Diagnosis not present

## 2024-07-10 DIAGNOSIS — I679 Cerebrovascular disease, unspecified: Secondary | ICD-10-CM | POA: Diagnosis not present

## 2024-07-10 DIAGNOSIS — Z7901 Long term (current) use of anticoagulants: Secondary | ICD-10-CM | POA: Diagnosis not present

## 2024-07-10 DIAGNOSIS — Z720 Tobacco use: Secondary | ICD-10-CM | POA: Diagnosis not present

## 2024-07-10 DIAGNOSIS — I69341 Monoplegia of lower limb following cerebral infarction affecting right dominant side: Secondary | ICD-10-CM | POA: Diagnosis not present

## 2024-07-11 DIAGNOSIS — E46 Unspecified protein-calorie malnutrition: Secondary | ICD-10-CM | POA: Diagnosis not present

## 2024-07-11 DIAGNOSIS — T6701XD Heatstroke and sunstroke, subsequent encounter: Secondary | ICD-10-CM | POA: Diagnosis not present

## 2024-07-11 DIAGNOSIS — T24211D Burn of second degree of right thigh, subsequent encounter: Secondary | ICD-10-CM | POA: Diagnosis not present

## 2024-07-11 DIAGNOSIS — K219 Gastro-esophageal reflux disease without esophagitis: Secondary | ICD-10-CM | POA: Diagnosis not present

## 2024-07-11 DIAGNOSIS — G4700 Insomnia, unspecified: Secondary | ICD-10-CM | POA: Diagnosis not present

## 2024-07-11 DIAGNOSIS — N1831 Chronic kidney disease, stage 3a: Secondary | ICD-10-CM | POA: Diagnosis not present

## 2024-07-11 DIAGNOSIS — M4802 Spinal stenosis, cervical region: Secondary | ICD-10-CM | POA: Diagnosis not present

## 2024-07-11 DIAGNOSIS — I700 Atherosclerosis of aorta: Secondary | ICD-10-CM | POA: Diagnosis not present

## 2024-07-11 DIAGNOSIS — I130 Hypertensive heart and chronic kidney disease with heart failure and stage 1 through stage 4 chronic kidney disease, or unspecified chronic kidney disease: Secondary | ICD-10-CM | POA: Diagnosis not present

## 2024-07-11 DIAGNOSIS — Z7982 Long term (current) use of aspirin: Secondary | ICD-10-CM | POA: Diagnosis not present

## 2024-07-11 DIAGNOSIS — I5032 Chronic diastolic (congestive) heart failure: Secondary | ICD-10-CM | POA: Diagnosis not present

## 2024-07-11 DIAGNOSIS — Z87440 Personal history of urinary (tract) infections: Secondary | ICD-10-CM | POA: Diagnosis not present

## 2024-07-11 DIAGNOSIS — E611 Iron deficiency: Secondary | ICD-10-CM | POA: Diagnosis not present

## 2024-07-11 DIAGNOSIS — E538 Deficiency of other specified B group vitamins: Secondary | ICD-10-CM | POA: Diagnosis not present

## 2024-07-11 DIAGNOSIS — Z86718 Personal history of other venous thrombosis and embolism: Secondary | ICD-10-CM | POA: Diagnosis not present

## 2024-07-11 DIAGNOSIS — Z556 Problems related to health literacy: Secondary | ICD-10-CM | POA: Diagnosis not present

## 2024-07-11 DIAGNOSIS — D631 Anemia in chronic kidney disease: Secondary | ICD-10-CM | POA: Diagnosis not present

## 2024-07-11 DIAGNOSIS — Z7901 Long term (current) use of anticoagulants: Secondary | ICD-10-CM | POA: Diagnosis not present

## 2024-07-11 DIAGNOSIS — J449 Chronic obstructive pulmonary disease, unspecified: Secondary | ICD-10-CM | POA: Diagnosis not present

## 2024-07-11 DIAGNOSIS — K7469 Other cirrhosis of liver: Secondary | ICD-10-CM | POA: Diagnosis not present

## 2024-07-11 DIAGNOSIS — F17200 Nicotine dependence, unspecified, uncomplicated: Secondary | ICD-10-CM | POA: Diagnosis not present

## 2024-07-11 DIAGNOSIS — M898X1 Other specified disorders of bone, shoulder: Secondary | ICD-10-CM | POA: Diagnosis not present

## 2024-07-11 DIAGNOSIS — Z8673 Personal history of transient ischemic attack (TIA), and cerebral infarction without residual deficits: Secondary | ICD-10-CM | POA: Diagnosis not present

## 2024-07-12 DIAGNOSIS — Z86718 Personal history of other venous thrombosis and embolism: Secondary | ICD-10-CM | POA: Diagnosis not present

## 2024-07-12 DIAGNOSIS — N1831 Chronic kidney disease, stage 3a: Secondary | ICD-10-CM | POA: Diagnosis not present

## 2024-07-12 DIAGNOSIS — Z87440 Personal history of urinary (tract) infections: Secondary | ICD-10-CM | POA: Diagnosis not present

## 2024-07-12 DIAGNOSIS — T24211D Burn of second degree of right thigh, subsequent encounter: Secondary | ICD-10-CM | POA: Diagnosis not present

## 2024-07-12 DIAGNOSIS — Z7901 Long term (current) use of anticoagulants: Secondary | ICD-10-CM | POA: Diagnosis not present

## 2024-07-12 DIAGNOSIS — E611 Iron deficiency: Secondary | ICD-10-CM | POA: Diagnosis not present

## 2024-07-12 DIAGNOSIS — J449 Chronic obstructive pulmonary disease, unspecified: Secondary | ICD-10-CM | POA: Diagnosis not present

## 2024-07-12 DIAGNOSIS — E538 Deficiency of other specified B group vitamins: Secondary | ICD-10-CM | POA: Diagnosis not present

## 2024-07-12 DIAGNOSIS — K7469 Other cirrhosis of liver: Secondary | ICD-10-CM | POA: Diagnosis not present

## 2024-07-12 DIAGNOSIS — T6701XD Heatstroke and sunstroke, subsequent encounter: Secondary | ICD-10-CM | POA: Diagnosis not present

## 2024-07-12 DIAGNOSIS — Z556 Problems related to health literacy: Secondary | ICD-10-CM | POA: Diagnosis not present

## 2024-07-12 DIAGNOSIS — E46 Unspecified protein-calorie malnutrition: Secondary | ICD-10-CM | POA: Diagnosis not present

## 2024-07-12 DIAGNOSIS — G4700 Insomnia, unspecified: Secondary | ICD-10-CM | POA: Diagnosis not present

## 2024-07-12 DIAGNOSIS — I5032 Chronic diastolic (congestive) heart failure: Secondary | ICD-10-CM | POA: Diagnosis not present

## 2024-07-12 DIAGNOSIS — I130 Hypertensive heart and chronic kidney disease with heart failure and stage 1 through stage 4 chronic kidney disease, or unspecified chronic kidney disease: Secondary | ICD-10-CM | POA: Diagnosis not present

## 2024-07-12 DIAGNOSIS — I700 Atherosclerosis of aorta: Secondary | ICD-10-CM | POA: Diagnosis not present

## 2024-07-12 DIAGNOSIS — K219 Gastro-esophageal reflux disease without esophagitis: Secondary | ICD-10-CM | POA: Diagnosis not present

## 2024-07-12 DIAGNOSIS — Z8673 Personal history of transient ischemic attack (TIA), and cerebral infarction without residual deficits: Secondary | ICD-10-CM | POA: Diagnosis not present

## 2024-07-12 DIAGNOSIS — M4802 Spinal stenosis, cervical region: Secondary | ICD-10-CM | POA: Diagnosis not present

## 2024-07-12 DIAGNOSIS — Z7982 Long term (current) use of aspirin: Secondary | ICD-10-CM | POA: Diagnosis not present

## 2024-07-12 DIAGNOSIS — F17200 Nicotine dependence, unspecified, uncomplicated: Secondary | ICD-10-CM | POA: Diagnosis not present

## 2024-07-12 DIAGNOSIS — M898X1 Other specified disorders of bone, shoulder: Secondary | ICD-10-CM | POA: Diagnosis not present

## 2024-07-12 DIAGNOSIS — D631 Anemia in chronic kidney disease: Secondary | ICD-10-CM | POA: Diagnosis not present

## 2024-07-16 DIAGNOSIS — T6701XD Heatstroke and sunstroke, subsequent encounter: Secondary | ICD-10-CM | POA: Diagnosis not present

## 2024-07-16 DIAGNOSIS — E46 Unspecified protein-calorie malnutrition: Secondary | ICD-10-CM | POA: Diagnosis not present

## 2024-07-16 DIAGNOSIS — I5032 Chronic diastolic (congestive) heart failure: Secondary | ICD-10-CM | POA: Diagnosis not present

## 2024-07-16 DIAGNOSIS — I700 Atherosclerosis of aorta: Secondary | ICD-10-CM | POA: Diagnosis not present

## 2024-07-16 DIAGNOSIS — M898X1 Other specified disorders of bone, shoulder: Secondary | ICD-10-CM | POA: Diagnosis not present

## 2024-07-16 DIAGNOSIS — N1831 Chronic kidney disease, stage 3a: Secondary | ICD-10-CM | POA: Diagnosis not present

## 2024-07-16 DIAGNOSIS — Z86718 Personal history of other venous thrombosis and embolism: Secondary | ICD-10-CM | POA: Diagnosis not present

## 2024-07-16 DIAGNOSIS — E538 Deficiency of other specified B group vitamins: Secondary | ICD-10-CM | POA: Diagnosis not present

## 2024-07-16 DIAGNOSIS — Z8673 Personal history of transient ischemic attack (TIA), and cerebral infarction without residual deficits: Secondary | ICD-10-CM | POA: Diagnosis not present

## 2024-07-16 DIAGNOSIS — Z7982 Long term (current) use of aspirin: Secondary | ICD-10-CM | POA: Diagnosis not present

## 2024-07-16 DIAGNOSIS — K219 Gastro-esophageal reflux disease without esophagitis: Secondary | ICD-10-CM | POA: Diagnosis not present

## 2024-07-16 DIAGNOSIS — Z556 Problems related to health literacy: Secondary | ICD-10-CM | POA: Diagnosis not present

## 2024-07-16 DIAGNOSIS — G4700 Insomnia, unspecified: Secondary | ICD-10-CM | POA: Diagnosis not present

## 2024-07-16 DIAGNOSIS — D631 Anemia in chronic kidney disease: Secondary | ICD-10-CM | POA: Diagnosis not present

## 2024-07-16 DIAGNOSIS — T24211D Burn of second degree of right thigh, subsequent encounter: Secondary | ICD-10-CM | POA: Diagnosis not present

## 2024-07-16 DIAGNOSIS — Z7901 Long term (current) use of anticoagulants: Secondary | ICD-10-CM | POA: Diagnosis not present

## 2024-07-16 DIAGNOSIS — K7469 Other cirrhosis of liver: Secondary | ICD-10-CM | POA: Diagnosis not present

## 2024-07-16 DIAGNOSIS — E611 Iron deficiency: Secondary | ICD-10-CM | POA: Diagnosis not present

## 2024-07-16 DIAGNOSIS — I130 Hypertensive heart and chronic kidney disease with heart failure and stage 1 through stage 4 chronic kidney disease, or unspecified chronic kidney disease: Secondary | ICD-10-CM | POA: Diagnosis not present

## 2024-07-16 DIAGNOSIS — F17200 Nicotine dependence, unspecified, uncomplicated: Secondary | ICD-10-CM | POA: Diagnosis not present

## 2024-07-16 DIAGNOSIS — Z87440 Personal history of urinary (tract) infections: Secondary | ICD-10-CM | POA: Diagnosis not present

## 2024-07-16 DIAGNOSIS — J449 Chronic obstructive pulmonary disease, unspecified: Secondary | ICD-10-CM | POA: Diagnosis not present

## 2024-07-16 DIAGNOSIS — M4802 Spinal stenosis, cervical region: Secondary | ICD-10-CM | POA: Diagnosis not present

## 2024-07-19 DIAGNOSIS — M4802 Spinal stenosis, cervical region: Secondary | ICD-10-CM | POA: Diagnosis not present

## 2024-07-19 DIAGNOSIS — Z7982 Long term (current) use of aspirin: Secondary | ICD-10-CM | POA: Diagnosis not present

## 2024-07-19 DIAGNOSIS — Z87440 Personal history of urinary (tract) infections: Secondary | ICD-10-CM | POA: Diagnosis not present

## 2024-07-19 DIAGNOSIS — M898X1 Other specified disorders of bone, shoulder: Secondary | ICD-10-CM | POA: Diagnosis not present

## 2024-07-19 DIAGNOSIS — E46 Unspecified protein-calorie malnutrition: Secondary | ICD-10-CM | POA: Diagnosis not present

## 2024-07-19 DIAGNOSIS — K7469 Other cirrhosis of liver: Secondary | ICD-10-CM | POA: Diagnosis not present

## 2024-07-19 DIAGNOSIS — J449 Chronic obstructive pulmonary disease, unspecified: Secondary | ICD-10-CM | POA: Diagnosis not present

## 2024-07-19 DIAGNOSIS — N1831 Chronic kidney disease, stage 3a: Secondary | ICD-10-CM | POA: Diagnosis not present

## 2024-07-19 DIAGNOSIS — F17200 Nicotine dependence, unspecified, uncomplicated: Secondary | ICD-10-CM | POA: Diagnosis not present

## 2024-07-19 DIAGNOSIS — I130 Hypertensive heart and chronic kidney disease with heart failure and stage 1 through stage 4 chronic kidney disease, or unspecified chronic kidney disease: Secondary | ICD-10-CM | POA: Diagnosis not present

## 2024-07-19 DIAGNOSIS — I700 Atherosclerosis of aorta: Secondary | ICD-10-CM | POA: Diagnosis not present

## 2024-07-19 DIAGNOSIS — Z7901 Long term (current) use of anticoagulants: Secondary | ICD-10-CM | POA: Diagnosis not present

## 2024-07-19 DIAGNOSIS — T24211D Burn of second degree of right thigh, subsequent encounter: Secondary | ICD-10-CM | POA: Diagnosis not present

## 2024-07-19 DIAGNOSIS — K219 Gastro-esophageal reflux disease without esophagitis: Secondary | ICD-10-CM | POA: Diagnosis not present

## 2024-07-19 DIAGNOSIS — G4700 Insomnia, unspecified: Secondary | ICD-10-CM | POA: Diagnosis not present

## 2024-07-19 DIAGNOSIS — T6701XD Heatstroke and sunstroke, subsequent encounter: Secondary | ICD-10-CM | POA: Diagnosis not present

## 2024-07-19 DIAGNOSIS — D631 Anemia in chronic kidney disease: Secondary | ICD-10-CM | POA: Diagnosis not present

## 2024-07-19 DIAGNOSIS — Z86718 Personal history of other venous thrombosis and embolism: Secondary | ICD-10-CM | POA: Diagnosis not present

## 2024-07-19 DIAGNOSIS — I5032 Chronic diastolic (congestive) heart failure: Secondary | ICD-10-CM | POA: Diagnosis not present

## 2024-07-19 DIAGNOSIS — E611 Iron deficiency: Secondary | ICD-10-CM | POA: Diagnosis not present

## 2024-07-19 DIAGNOSIS — Z8673 Personal history of transient ischemic attack (TIA), and cerebral infarction without residual deficits: Secondary | ICD-10-CM | POA: Diagnosis not present

## 2024-07-19 DIAGNOSIS — Z556 Problems related to health literacy: Secondary | ICD-10-CM | POA: Diagnosis not present

## 2024-07-19 DIAGNOSIS — E538 Deficiency of other specified B group vitamins: Secondary | ICD-10-CM | POA: Diagnosis not present

## 2024-07-24 DIAGNOSIS — K219 Gastro-esophageal reflux disease without esophagitis: Secondary | ICD-10-CM | POA: Diagnosis not present

## 2024-07-24 DIAGNOSIS — Z556 Problems related to health literacy: Secondary | ICD-10-CM | POA: Diagnosis not present

## 2024-07-24 DIAGNOSIS — K7469 Other cirrhosis of liver: Secondary | ICD-10-CM | POA: Diagnosis not present

## 2024-07-24 DIAGNOSIS — E538 Deficiency of other specified B group vitamins: Secondary | ICD-10-CM | POA: Diagnosis not present

## 2024-07-24 DIAGNOSIS — T24211D Burn of second degree of right thigh, subsequent encounter: Secondary | ICD-10-CM | POA: Diagnosis not present

## 2024-07-24 DIAGNOSIS — F17200 Nicotine dependence, unspecified, uncomplicated: Secondary | ICD-10-CM | POA: Diagnosis not present

## 2024-07-24 DIAGNOSIS — I5032 Chronic diastolic (congestive) heart failure: Secondary | ICD-10-CM | POA: Diagnosis not present

## 2024-07-24 DIAGNOSIS — Z87440 Personal history of urinary (tract) infections: Secondary | ICD-10-CM | POA: Diagnosis not present

## 2024-07-24 DIAGNOSIS — M898X1 Other specified disorders of bone, shoulder: Secondary | ICD-10-CM | POA: Diagnosis not present

## 2024-07-24 DIAGNOSIS — E611 Iron deficiency: Secondary | ICD-10-CM | POA: Diagnosis not present

## 2024-07-24 DIAGNOSIS — Z7982 Long term (current) use of aspirin: Secondary | ICD-10-CM | POA: Diagnosis not present

## 2024-07-24 DIAGNOSIS — D631 Anemia in chronic kidney disease: Secondary | ICD-10-CM | POA: Diagnosis not present

## 2024-07-24 DIAGNOSIS — Z86718 Personal history of other venous thrombosis and embolism: Secondary | ICD-10-CM | POA: Diagnosis not present

## 2024-07-24 DIAGNOSIS — N1831 Chronic kidney disease, stage 3a: Secondary | ICD-10-CM | POA: Diagnosis not present

## 2024-07-24 DIAGNOSIS — M4802 Spinal stenosis, cervical region: Secondary | ICD-10-CM | POA: Diagnosis not present

## 2024-07-24 DIAGNOSIS — Z7901 Long term (current) use of anticoagulants: Secondary | ICD-10-CM | POA: Diagnosis not present

## 2024-07-24 DIAGNOSIS — E46 Unspecified protein-calorie malnutrition: Secondary | ICD-10-CM | POA: Diagnosis not present

## 2024-07-24 DIAGNOSIS — I700 Atherosclerosis of aorta: Secondary | ICD-10-CM | POA: Diagnosis not present

## 2024-07-24 DIAGNOSIS — G4700 Insomnia, unspecified: Secondary | ICD-10-CM | POA: Diagnosis not present

## 2024-07-24 DIAGNOSIS — J449 Chronic obstructive pulmonary disease, unspecified: Secondary | ICD-10-CM | POA: Diagnosis not present

## 2024-07-24 DIAGNOSIS — Z8673 Personal history of transient ischemic attack (TIA), and cerebral infarction without residual deficits: Secondary | ICD-10-CM | POA: Diagnosis not present

## 2024-07-24 DIAGNOSIS — I130 Hypertensive heart and chronic kidney disease with heart failure and stage 1 through stage 4 chronic kidney disease, or unspecified chronic kidney disease: Secondary | ICD-10-CM | POA: Diagnosis not present

## 2024-07-24 DIAGNOSIS — T6701XD Heatstroke and sunstroke, subsequent encounter: Secondary | ICD-10-CM | POA: Diagnosis not present

## 2024-07-25 DIAGNOSIS — E46 Unspecified protein-calorie malnutrition: Secondary | ICD-10-CM | POA: Diagnosis not present

## 2024-07-25 DIAGNOSIS — T6701XD Heatstroke and sunstroke, subsequent encounter: Secondary | ICD-10-CM | POA: Diagnosis not present

## 2024-07-25 DIAGNOSIS — E538 Deficiency of other specified B group vitamins: Secondary | ICD-10-CM | POA: Diagnosis not present

## 2024-07-25 DIAGNOSIS — N1831 Chronic kidney disease, stage 3a: Secondary | ICD-10-CM | POA: Diagnosis not present

## 2024-07-25 DIAGNOSIS — M898X1 Other specified disorders of bone, shoulder: Secondary | ICD-10-CM | POA: Diagnosis not present

## 2024-07-25 DIAGNOSIS — T24211D Burn of second degree of right thigh, subsequent encounter: Secondary | ICD-10-CM | POA: Diagnosis not present

## 2024-07-25 DIAGNOSIS — I130 Hypertensive heart and chronic kidney disease with heart failure and stage 1 through stage 4 chronic kidney disease, or unspecified chronic kidney disease: Secondary | ICD-10-CM | POA: Diagnosis not present

## 2024-07-25 DIAGNOSIS — G4700 Insomnia, unspecified: Secondary | ICD-10-CM | POA: Diagnosis not present

## 2024-07-25 DIAGNOSIS — Z87440 Personal history of urinary (tract) infections: Secondary | ICD-10-CM | POA: Diagnosis not present

## 2024-07-25 DIAGNOSIS — D631 Anemia in chronic kidney disease: Secondary | ICD-10-CM | POA: Diagnosis not present

## 2024-07-25 DIAGNOSIS — E611 Iron deficiency: Secondary | ICD-10-CM | POA: Diagnosis not present

## 2024-07-25 DIAGNOSIS — I700 Atherosclerosis of aorta: Secondary | ICD-10-CM | POA: Diagnosis not present

## 2024-07-25 DIAGNOSIS — K219 Gastro-esophageal reflux disease without esophagitis: Secondary | ICD-10-CM | POA: Diagnosis not present

## 2024-07-25 DIAGNOSIS — Z556 Problems related to health literacy: Secondary | ICD-10-CM | POA: Diagnosis not present

## 2024-07-25 DIAGNOSIS — Z7982 Long term (current) use of aspirin: Secondary | ICD-10-CM | POA: Diagnosis not present

## 2024-07-25 DIAGNOSIS — M4802 Spinal stenosis, cervical region: Secondary | ICD-10-CM | POA: Diagnosis not present

## 2024-07-25 DIAGNOSIS — Z86718 Personal history of other venous thrombosis and embolism: Secondary | ICD-10-CM | POA: Diagnosis not present

## 2024-07-25 DIAGNOSIS — Z8673 Personal history of transient ischemic attack (TIA), and cerebral infarction without residual deficits: Secondary | ICD-10-CM | POA: Diagnosis not present

## 2024-07-25 DIAGNOSIS — J449 Chronic obstructive pulmonary disease, unspecified: Secondary | ICD-10-CM | POA: Diagnosis not present

## 2024-07-25 DIAGNOSIS — Z7901 Long term (current) use of anticoagulants: Secondary | ICD-10-CM | POA: Diagnosis not present

## 2024-07-25 DIAGNOSIS — I5032 Chronic diastolic (congestive) heart failure: Secondary | ICD-10-CM | POA: Diagnosis not present

## 2024-07-25 DIAGNOSIS — F17200 Nicotine dependence, unspecified, uncomplicated: Secondary | ICD-10-CM | POA: Diagnosis not present

## 2024-07-25 DIAGNOSIS — K7469 Other cirrhosis of liver: Secondary | ICD-10-CM | POA: Diagnosis not present

## 2024-08-01 DIAGNOSIS — J449 Chronic obstructive pulmonary disease, unspecified: Secondary | ICD-10-CM | POA: Diagnosis not present

## 2024-08-01 DIAGNOSIS — Z86718 Personal history of other venous thrombosis and embolism: Secondary | ICD-10-CM | POA: Diagnosis not present

## 2024-08-01 DIAGNOSIS — Z8673 Personal history of transient ischemic attack (TIA), and cerebral infarction without residual deficits: Secondary | ICD-10-CM | POA: Diagnosis not present

## 2024-08-01 DIAGNOSIS — G4700 Insomnia, unspecified: Secondary | ICD-10-CM | POA: Diagnosis not present

## 2024-08-01 DIAGNOSIS — Z87440 Personal history of urinary (tract) infections: Secondary | ICD-10-CM | POA: Diagnosis not present

## 2024-08-01 DIAGNOSIS — Z7901 Long term (current) use of anticoagulants: Secondary | ICD-10-CM | POA: Diagnosis not present

## 2024-08-01 DIAGNOSIS — D631 Anemia in chronic kidney disease: Secondary | ICD-10-CM | POA: Diagnosis not present

## 2024-08-01 DIAGNOSIS — Z556 Problems related to health literacy: Secondary | ICD-10-CM | POA: Diagnosis not present

## 2024-08-01 DIAGNOSIS — E611 Iron deficiency: Secondary | ICD-10-CM | POA: Diagnosis not present

## 2024-08-01 DIAGNOSIS — K7469 Other cirrhosis of liver: Secondary | ICD-10-CM | POA: Diagnosis not present

## 2024-08-01 DIAGNOSIS — Z7982 Long term (current) use of aspirin: Secondary | ICD-10-CM | POA: Diagnosis not present

## 2024-08-01 DIAGNOSIS — T6701XD Heatstroke and sunstroke, subsequent encounter: Secondary | ICD-10-CM | POA: Diagnosis not present

## 2024-08-01 DIAGNOSIS — K219 Gastro-esophageal reflux disease without esophagitis: Secondary | ICD-10-CM | POA: Diagnosis not present

## 2024-08-01 DIAGNOSIS — E538 Deficiency of other specified B group vitamins: Secondary | ICD-10-CM | POA: Diagnosis not present

## 2024-08-01 DIAGNOSIS — E46 Unspecified protein-calorie malnutrition: Secondary | ICD-10-CM | POA: Diagnosis not present

## 2024-08-01 DIAGNOSIS — F17200 Nicotine dependence, unspecified, uncomplicated: Secondary | ICD-10-CM | POA: Diagnosis not present

## 2024-08-01 DIAGNOSIS — I130 Hypertensive heart and chronic kidney disease with heart failure and stage 1 through stage 4 chronic kidney disease, or unspecified chronic kidney disease: Secondary | ICD-10-CM | POA: Diagnosis not present

## 2024-08-01 DIAGNOSIS — N1831 Chronic kidney disease, stage 3a: Secondary | ICD-10-CM | POA: Diagnosis not present

## 2024-08-01 DIAGNOSIS — I700 Atherosclerosis of aorta: Secondary | ICD-10-CM | POA: Diagnosis not present

## 2024-08-01 DIAGNOSIS — M4802 Spinal stenosis, cervical region: Secondary | ICD-10-CM | POA: Diagnosis not present

## 2024-08-01 DIAGNOSIS — T24211D Burn of second degree of right thigh, subsequent encounter: Secondary | ICD-10-CM | POA: Diagnosis not present

## 2024-08-01 DIAGNOSIS — M898X1 Other specified disorders of bone, shoulder: Secondary | ICD-10-CM | POA: Diagnosis not present

## 2024-08-01 DIAGNOSIS — I5032 Chronic diastolic (congestive) heart failure: Secondary | ICD-10-CM | POA: Diagnosis not present

## 2024-08-08 DIAGNOSIS — I5032 Chronic diastolic (congestive) heart failure: Secondary | ICD-10-CM | POA: Diagnosis not present

## 2024-08-08 DIAGNOSIS — Z7901 Long term (current) use of anticoagulants: Secondary | ICD-10-CM | POA: Diagnosis not present

## 2024-08-08 DIAGNOSIS — Z87440 Personal history of urinary (tract) infections: Secondary | ICD-10-CM | POA: Diagnosis not present

## 2024-08-08 DIAGNOSIS — K7469 Other cirrhosis of liver: Secondary | ICD-10-CM | POA: Diagnosis not present

## 2024-08-08 DIAGNOSIS — Z556 Problems related to health literacy: Secondary | ICD-10-CM | POA: Diagnosis not present

## 2024-08-08 DIAGNOSIS — T24211D Burn of second degree of right thigh, subsequent encounter: Secondary | ICD-10-CM | POA: Diagnosis not present

## 2024-08-08 DIAGNOSIS — Z8673 Personal history of transient ischemic attack (TIA), and cerebral infarction without residual deficits: Secondary | ICD-10-CM | POA: Diagnosis not present

## 2024-08-08 DIAGNOSIS — I130 Hypertensive heart and chronic kidney disease with heart failure and stage 1 through stage 4 chronic kidney disease, or unspecified chronic kidney disease: Secondary | ICD-10-CM | POA: Diagnosis not present

## 2024-08-08 DIAGNOSIS — F17200 Nicotine dependence, unspecified, uncomplicated: Secondary | ICD-10-CM | POA: Diagnosis not present

## 2024-08-08 DIAGNOSIS — M4802 Spinal stenosis, cervical region: Secondary | ICD-10-CM | POA: Diagnosis not present

## 2024-08-08 DIAGNOSIS — N1831 Chronic kidney disease, stage 3a: Secondary | ICD-10-CM | POA: Diagnosis not present

## 2024-08-08 DIAGNOSIS — J449 Chronic obstructive pulmonary disease, unspecified: Secondary | ICD-10-CM | POA: Diagnosis not present

## 2024-08-08 DIAGNOSIS — K219 Gastro-esophageal reflux disease without esophagitis: Secondary | ICD-10-CM | POA: Diagnosis not present

## 2024-08-08 DIAGNOSIS — E611 Iron deficiency: Secondary | ICD-10-CM | POA: Diagnosis not present

## 2024-08-08 DIAGNOSIS — T6701XD Heatstroke and sunstroke, subsequent encounter: Secondary | ICD-10-CM | POA: Diagnosis not present

## 2024-08-08 DIAGNOSIS — E538 Deficiency of other specified B group vitamins: Secondary | ICD-10-CM | POA: Diagnosis not present

## 2024-08-08 DIAGNOSIS — G4700 Insomnia, unspecified: Secondary | ICD-10-CM | POA: Diagnosis not present

## 2024-08-08 DIAGNOSIS — E46 Unspecified protein-calorie malnutrition: Secondary | ICD-10-CM | POA: Diagnosis not present

## 2024-08-08 DIAGNOSIS — M898X1 Other specified disorders of bone, shoulder: Secondary | ICD-10-CM | POA: Diagnosis not present

## 2024-08-08 DIAGNOSIS — I700 Atherosclerosis of aorta: Secondary | ICD-10-CM | POA: Diagnosis not present

## 2024-08-08 DIAGNOSIS — Z7982 Long term (current) use of aspirin: Secondary | ICD-10-CM | POA: Diagnosis not present

## 2024-08-08 DIAGNOSIS — Z86718 Personal history of other venous thrombosis and embolism: Secondary | ICD-10-CM | POA: Diagnosis not present

## 2024-08-08 DIAGNOSIS — D631 Anemia in chronic kidney disease: Secondary | ICD-10-CM | POA: Diagnosis not present

## 2024-08-14 DIAGNOSIS — K219 Gastro-esophageal reflux disease without esophagitis: Secondary | ICD-10-CM | POA: Diagnosis not present

## 2024-08-14 DIAGNOSIS — Z86718 Personal history of other venous thrombosis and embolism: Secondary | ICD-10-CM | POA: Diagnosis not present

## 2024-08-14 DIAGNOSIS — Z87440 Personal history of urinary (tract) infections: Secondary | ICD-10-CM | POA: Diagnosis not present

## 2024-08-14 DIAGNOSIS — E46 Unspecified protein-calorie malnutrition: Secondary | ICD-10-CM | POA: Diagnosis not present

## 2024-08-14 DIAGNOSIS — I700 Atherosclerosis of aorta: Secondary | ICD-10-CM | POA: Diagnosis not present

## 2024-08-14 DIAGNOSIS — M898X1 Other specified disorders of bone, shoulder: Secondary | ICD-10-CM | POA: Diagnosis not present

## 2024-08-14 DIAGNOSIS — J449 Chronic obstructive pulmonary disease, unspecified: Secondary | ICD-10-CM | POA: Diagnosis not present

## 2024-08-14 DIAGNOSIS — T6701XD Heatstroke and sunstroke, subsequent encounter: Secondary | ICD-10-CM | POA: Diagnosis not present

## 2024-08-14 DIAGNOSIS — M4802 Spinal stenosis, cervical region: Secondary | ICD-10-CM | POA: Diagnosis not present

## 2024-08-14 DIAGNOSIS — T24211D Burn of second degree of right thigh, subsequent encounter: Secondary | ICD-10-CM | POA: Diagnosis not present

## 2024-08-14 DIAGNOSIS — Z7982 Long term (current) use of aspirin: Secondary | ICD-10-CM | POA: Diagnosis not present

## 2024-08-14 DIAGNOSIS — F17200 Nicotine dependence, unspecified, uncomplicated: Secondary | ICD-10-CM | POA: Diagnosis not present

## 2024-08-14 DIAGNOSIS — Z8673 Personal history of transient ischemic attack (TIA), and cerebral infarction without residual deficits: Secondary | ICD-10-CM | POA: Diagnosis not present

## 2024-08-14 DIAGNOSIS — N1831 Chronic kidney disease, stage 3a: Secondary | ICD-10-CM | POA: Diagnosis not present

## 2024-08-14 DIAGNOSIS — K7469 Other cirrhosis of liver: Secondary | ICD-10-CM | POA: Diagnosis not present

## 2024-08-14 DIAGNOSIS — G4700 Insomnia, unspecified: Secondary | ICD-10-CM | POA: Diagnosis not present

## 2024-08-14 DIAGNOSIS — E611 Iron deficiency: Secondary | ICD-10-CM | POA: Diagnosis not present

## 2024-08-14 DIAGNOSIS — D631 Anemia in chronic kidney disease: Secondary | ICD-10-CM | POA: Diagnosis not present

## 2024-08-14 DIAGNOSIS — Z556 Problems related to health literacy: Secondary | ICD-10-CM | POA: Diagnosis not present

## 2024-08-14 DIAGNOSIS — Z7901 Long term (current) use of anticoagulants: Secondary | ICD-10-CM | POA: Diagnosis not present

## 2024-08-14 DIAGNOSIS — E538 Deficiency of other specified B group vitamins: Secondary | ICD-10-CM | POA: Diagnosis not present

## 2024-08-14 DIAGNOSIS — I5032 Chronic diastolic (congestive) heart failure: Secondary | ICD-10-CM | POA: Diagnosis not present

## 2024-08-14 DIAGNOSIS — I130 Hypertensive heart and chronic kidney disease with heart failure and stage 1 through stage 4 chronic kidney disease, or unspecified chronic kidney disease: Secondary | ICD-10-CM | POA: Diagnosis not present

## 2024-08-24 DIAGNOSIS — E611 Iron deficiency: Secondary | ICD-10-CM | POA: Diagnosis not present

## 2024-08-24 DIAGNOSIS — K219 Gastro-esophageal reflux disease without esophagitis: Secondary | ICD-10-CM | POA: Diagnosis not present

## 2024-08-24 DIAGNOSIS — T6701XD Heatstroke and sunstroke, subsequent encounter: Secondary | ICD-10-CM | POA: Diagnosis not present

## 2024-08-24 DIAGNOSIS — E46 Unspecified protein-calorie malnutrition: Secondary | ICD-10-CM | POA: Diagnosis not present

## 2024-08-24 DIAGNOSIS — N1831 Chronic kidney disease, stage 3a: Secondary | ICD-10-CM | POA: Diagnosis not present

## 2024-08-24 DIAGNOSIS — M4802 Spinal stenosis, cervical region: Secondary | ICD-10-CM | POA: Diagnosis not present

## 2024-08-24 DIAGNOSIS — D631 Anemia in chronic kidney disease: Secondary | ICD-10-CM | POA: Diagnosis not present

## 2024-08-24 DIAGNOSIS — I130 Hypertensive heart and chronic kidney disease with heart failure and stage 1 through stage 4 chronic kidney disease, or unspecified chronic kidney disease: Secondary | ICD-10-CM | POA: Diagnosis not present

## 2024-08-24 DIAGNOSIS — I5032 Chronic diastolic (congestive) heart failure: Secondary | ICD-10-CM | POA: Diagnosis not present

## 2024-08-24 DIAGNOSIS — I700 Atherosclerosis of aorta: Secondary | ICD-10-CM | POA: Diagnosis not present

## 2024-08-28 DIAGNOSIS — E538 Deficiency of other specified B group vitamins: Secondary | ICD-10-CM | POA: Diagnosis not present

## 2024-08-28 DIAGNOSIS — Z7982 Long term (current) use of aspirin: Secondary | ICD-10-CM | POA: Diagnosis not present

## 2024-08-28 DIAGNOSIS — M898X1 Other specified disorders of bone, shoulder: Secondary | ICD-10-CM | POA: Diagnosis not present

## 2024-08-28 DIAGNOSIS — I700 Atherosclerosis of aorta: Secondary | ICD-10-CM | POA: Diagnosis not present

## 2024-08-28 DIAGNOSIS — D631 Anemia in chronic kidney disease: Secondary | ICD-10-CM | POA: Diagnosis not present

## 2024-08-28 DIAGNOSIS — K219 Gastro-esophageal reflux disease without esophagitis: Secondary | ICD-10-CM | POA: Diagnosis not present

## 2024-08-28 DIAGNOSIS — E46 Unspecified protein-calorie malnutrition: Secondary | ICD-10-CM | POA: Diagnosis not present

## 2024-08-28 DIAGNOSIS — Z8673 Personal history of transient ischemic attack (TIA), and cerebral infarction without residual deficits: Secondary | ICD-10-CM | POA: Diagnosis not present

## 2024-08-28 DIAGNOSIS — M4802 Spinal stenosis, cervical region: Secondary | ICD-10-CM | POA: Diagnosis not present

## 2024-08-28 DIAGNOSIS — N1831 Chronic kidney disease, stage 3a: Secondary | ICD-10-CM | POA: Diagnosis not present

## 2024-08-28 DIAGNOSIS — G4700 Insomnia, unspecified: Secondary | ICD-10-CM | POA: Diagnosis not present

## 2024-08-28 DIAGNOSIS — K7469 Other cirrhosis of liver: Secondary | ICD-10-CM | POA: Diagnosis not present

## 2024-08-28 DIAGNOSIS — I5032 Chronic diastolic (congestive) heart failure: Secondary | ICD-10-CM | POA: Diagnosis not present

## 2024-08-28 DIAGNOSIS — I130 Hypertensive heart and chronic kidney disease with heart failure and stage 1 through stage 4 chronic kidney disease, or unspecified chronic kidney disease: Secondary | ICD-10-CM | POA: Diagnosis not present

## 2024-08-28 DIAGNOSIS — E611 Iron deficiency: Secondary | ICD-10-CM | POA: Diagnosis not present

## 2024-08-28 DIAGNOSIS — Z87440 Personal history of urinary (tract) infections: Secondary | ICD-10-CM | POA: Diagnosis not present

## 2024-08-28 DIAGNOSIS — Z556 Problems related to health literacy: Secondary | ICD-10-CM | POA: Diagnosis not present

## 2024-08-28 DIAGNOSIS — F17210 Nicotine dependence, cigarettes, uncomplicated: Secondary | ICD-10-CM | POA: Diagnosis not present

## 2024-08-28 DIAGNOSIS — T6701XD Heatstroke and sunstroke, subsequent encounter: Secondary | ICD-10-CM | POA: Diagnosis not present

## 2024-08-28 DIAGNOSIS — J449 Chronic obstructive pulmonary disease, unspecified: Secondary | ICD-10-CM | POA: Diagnosis not present

## 2024-08-28 DIAGNOSIS — Z86718 Personal history of other venous thrombosis and embolism: Secondary | ICD-10-CM | POA: Diagnosis not present

## 2024-08-28 DIAGNOSIS — Z7901 Long term (current) use of anticoagulants: Secondary | ICD-10-CM | POA: Diagnosis not present

## 2024-09-04 DIAGNOSIS — K219 Gastro-esophageal reflux disease without esophagitis: Secondary | ICD-10-CM | POA: Diagnosis not present

## 2024-09-04 DIAGNOSIS — I130 Hypertensive heart and chronic kidney disease with heart failure and stage 1 through stage 4 chronic kidney disease, or unspecified chronic kidney disease: Secondary | ICD-10-CM | POA: Diagnosis not present

## 2024-09-04 DIAGNOSIS — T6701XD Heatstroke and sunstroke, subsequent encounter: Secondary | ICD-10-CM | POA: Diagnosis not present

## 2024-09-04 DIAGNOSIS — K7469 Other cirrhosis of liver: Secondary | ICD-10-CM | POA: Diagnosis not present

## 2024-09-04 DIAGNOSIS — E538 Deficiency of other specified B group vitamins: Secondary | ICD-10-CM | POA: Diagnosis not present

## 2024-09-04 DIAGNOSIS — D631 Anemia in chronic kidney disease: Secondary | ICD-10-CM | POA: Diagnosis not present

## 2024-09-04 DIAGNOSIS — M4802 Spinal stenosis, cervical region: Secondary | ICD-10-CM | POA: Diagnosis not present

## 2024-09-04 DIAGNOSIS — I5032 Chronic diastolic (congestive) heart failure: Secondary | ICD-10-CM | POA: Diagnosis not present

## 2024-09-04 DIAGNOSIS — F17210 Nicotine dependence, cigarettes, uncomplicated: Secondary | ICD-10-CM | POA: Diagnosis not present

## 2024-09-04 DIAGNOSIS — I700 Atherosclerosis of aorta: Secondary | ICD-10-CM | POA: Diagnosis not present

## 2024-09-04 DIAGNOSIS — Z556 Problems related to health literacy: Secondary | ICD-10-CM | POA: Diagnosis not present

## 2024-09-04 DIAGNOSIS — J449 Chronic obstructive pulmonary disease, unspecified: Secondary | ICD-10-CM | POA: Diagnosis not present

## 2024-09-04 DIAGNOSIS — Z87440 Personal history of urinary (tract) infections: Secondary | ICD-10-CM | POA: Diagnosis not present

## 2024-09-04 DIAGNOSIS — E611 Iron deficiency: Secondary | ICD-10-CM | POA: Diagnosis not present

## 2024-09-04 DIAGNOSIS — Z7901 Long term (current) use of anticoagulants: Secondary | ICD-10-CM | POA: Diagnosis not present

## 2024-09-04 DIAGNOSIS — Z86718 Personal history of other venous thrombosis and embolism: Secondary | ICD-10-CM | POA: Diagnosis not present

## 2024-09-04 DIAGNOSIS — N1831 Chronic kidney disease, stage 3a: Secondary | ICD-10-CM | POA: Diagnosis not present

## 2024-09-04 DIAGNOSIS — Z7982 Long term (current) use of aspirin: Secondary | ICD-10-CM | POA: Diagnosis not present

## 2024-09-04 DIAGNOSIS — E46 Unspecified protein-calorie malnutrition: Secondary | ICD-10-CM | POA: Diagnosis not present

## 2024-09-04 DIAGNOSIS — Z8673 Personal history of transient ischemic attack (TIA), and cerebral infarction without residual deficits: Secondary | ICD-10-CM | POA: Diagnosis not present

## 2024-09-04 DIAGNOSIS — G4700 Insomnia, unspecified: Secondary | ICD-10-CM | POA: Diagnosis not present

## 2024-09-04 DIAGNOSIS — M898X1 Other specified disorders of bone, shoulder: Secondary | ICD-10-CM | POA: Diagnosis not present

## 2024-09-05 DIAGNOSIS — M47814 Spondylosis without myelopathy or radiculopathy, thoracic region: Secondary | ICD-10-CM | POA: Diagnosis not present

## 2024-09-05 DIAGNOSIS — M48061 Spinal stenosis, lumbar region without neurogenic claudication: Secondary | ICD-10-CM | POA: Diagnosis not present

## 2024-09-05 DIAGNOSIS — M549 Dorsalgia, unspecified: Secondary | ICD-10-CM | POA: Diagnosis not present

## 2024-09-05 DIAGNOSIS — M25551 Pain in right hip: Secondary | ICD-10-CM | POA: Diagnosis not present

## 2024-09-05 DIAGNOSIS — M8580 Other specified disorders of bone density and structure, unspecified site: Secondary | ICD-10-CM | POA: Diagnosis not present

## 2024-09-05 DIAGNOSIS — M5136 Other intervertebral disc degeneration, lumbar region with discogenic back pain only: Secondary | ICD-10-CM | POA: Diagnosis not present

## 2024-09-05 DIAGNOSIS — R52 Pain, unspecified: Secondary | ICD-10-CM | POA: Diagnosis not present

## 2024-09-12 DIAGNOSIS — Z8673 Personal history of transient ischemic attack (TIA), and cerebral infarction without residual deficits: Secondary | ICD-10-CM | POA: Diagnosis not present

## 2024-09-12 DIAGNOSIS — K219 Gastro-esophageal reflux disease without esophagitis: Secondary | ICD-10-CM | POA: Diagnosis not present

## 2024-09-12 DIAGNOSIS — I130 Hypertensive heart and chronic kidney disease with heart failure and stage 1 through stage 4 chronic kidney disease, or unspecified chronic kidney disease: Secondary | ICD-10-CM | POA: Diagnosis not present

## 2024-09-12 DIAGNOSIS — N1831 Chronic kidney disease, stage 3a: Secondary | ICD-10-CM | POA: Diagnosis not present

## 2024-09-12 DIAGNOSIS — Z7901 Long term (current) use of anticoagulants: Secondary | ICD-10-CM | POA: Diagnosis not present

## 2024-09-12 DIAGNOSIS — M898X1 Other specified disorders of bone, shoulder: Secondary | ICD-10-CM | POA: Diagnosis not present

## 2024-09-12 DIAGNOSIS — Z87440 Personal history of urinary (tract) infections: Secondary | ICD-10-CM | POA: Diagnosis not present

## 2024-09-12 DIAGNOSIS — M4802 Spinal stenosis, cervical region: Secondary | ICD-10-CM | POA: Diagnosis not present

## 2024-09-12 DIAGNOSIS — J449 Chronic obstructive pulmonary disease, unspecified: Secondary | ICD-10-CM | POA: Diagnosis not present

## 2024-09-12 DIAGNOSIS — Z7982 Long term (current) use of aspirin: Secondary | ICD-10-CM | POA: Diagnosis not present

## 2024-09-12 DIAGNOSIS — F17210 Nicotine dependence, cigarettes, uncomplicated: Secondary | ICD-10-CM | POA: Diagnosis not present

## 2024-09-12 DIAGNOSIS — T6701XD Heatstroke and sunstroke, subsequent encounter: Secondary | ICD-10-CM | POA: Diagnosis not present

## 2024-09-12 DIAGNOSIS — D631 Anemia in chronic kidney disease: Secondary | ICD-10-CM | POA: Diagnosis not present

## 2024-09-12 DIAGNOSIS — G4700 Insomnia, unspecified: Secondary | ICD-10-CM | POA: Diagnosis not present

## 2024-09-12 DIAGNOSIS — K7469 Other cirrhosis of liver: Secondary | ICD-10-CM | POA: Diagnosis not present

## 2024-09-12 DIAGNOSIS — E46 Unspecified protein-calorie malnutrition: Secondary | ICD-10-CM | POA: Diagnosis not present

## 2024-09-12 DIAGNOSIS — Z86718 Personal history of other venous thrombosis and embolism: Secondary | ICD-10-CM | POA: Diagnosis not present

## 2024-09-12 DIAGNOSIS — E611 Iron deficiency: Secondary | ICD-10-CM | POA: Diagnosis not present

## 2024-09-12 DIAGNOSIS — I5032 Chronic diastolic (congestive) heart failure: Secondary | ICD-10-CM | POA: Diagnosis not present

## 2024-09-12 DIAGNOSIS — I700 Atherosclerosis of aorta: Secondary | ICD-10-CM | POA: Diagnosis not present

## 2024-09-12 DIAGNOSIS — Z556 Problems related to health literacy: Secondary | ICD-10-CM | POA: Diagnosis not present

## 2024-09-12 DIAGNOSIS — E538 Deficiency of other specified B group vitamins: Secondary | ICD-10-CM | POA: Diagnosis not present

## 2024-09-20 DIAGNOSIS — Z8673 Personal history of transient ischemic attack (TIA), and cerebral infarction without residual deficits: Secondary | ICD-10-CM | POA: Diagnosis not present

## 2024-09-20 DIAGNOSIS — T6701XD Heatstroke and sunstroke, subsequent encounter: Secondary | ICD-10-CM | POA: Diagnosis not present

## 2024-09-20 DIAGNOSIS — Z7901 Long term (current) use of anticoagulants: Secondary | ICD-10-CM | POA: Diagnosis not present

## 2024-09-20 DIAGNOSIS — E538 Deficiency of other specified B group vitamins: Secondary | ICD-10-CM | POA: Diagnosis not present

## 2024-09-20 DIAGNOSIS — Z556 Problems related to health literacy: Secondary | ICD-10-CM | POA: Diagnosis not present

## 2024-09-20 DIAGNOSIS — K219 Gastro-esophageal reflux disease without esophagitis: Secondary | ICD-10-CM | POA: Diagnosis not present

## 2024-09-20 DIAGNOSIS — G4700 Insomnia, unspecified: Secondary | ICD-10-CM | POA: Diagnosis not present

## 2024-09-20 DIAGNOSIS — N1831 Chronic kidney disease, stage 3a: Secondary | ICD-10-CM | POA: Diagnosis not present

## 2024-09-20 DIAGNOSIS — I5032 Chronic diastolic (congestive) heart failure: Secondary | ICD-10-CM | POA: Diagnosis not present

## 2024-09-20 DIAGNOSIS — E46 Unspecified protein-calorie malnutrition: Secondary | ICD-10-CM | POA: Diagnosis not present

## 2024-09-20 DIAGNOSIS — Z7982 Long term (current) use of aspirin: Secondary | ICD-10-CM | POA: Diagnosis not present

## 2024-09-20 DIAGNOSIS — D631 Anemia in chronic kidney disease: Secondary | ICD-10-CM | POA: Diagnosis not present

## 2024-09-20 DIAGNOSIS — I130 Hypertensive heart and chronic kidney disease with heart failure and stage 1 through stage 4 chronic kidney disease, or unspecified chronic kidney disease: Secondary | ICD-10-CM | POA: Diagnosis not present

## 2024-09-20 DIAGNOSIS — F17210 Nicotine dependence, cigarettes, uncomplicated: Secondary | ICD-10-CM | POA: Diagnosis not present

## 2024-09-20 DIAGNOSIS — M898X1 Other specified disorders of bone, shoulder: Secondary | ICD-10-CM | POA: Diagnosis not present

## 2024-09-20 DIAGNOSIS — E611 Iron deficiency: Secondary | ICD-10-CM | POA: Diagnosis not present

## 2024-09-20 DIAGNOSIS — Z86718 Personal history of other venous thrombosis and embolism: Secondary | ICD-10-CM | POA: Diagnosis not present

## 2024-09-20 DIAGNOSIS — Z87440 Personal history of urinary (tract) infections: Secondary | ICD-10-CM | POA: Diagnosis not present

## 2024-09-20 DIAGNOSIS — K7469 Other cirrhosis of liver: Secondary | ICD-10-CM | POA: Diagnosis not present

## 2024-09-20 DIAGNOSIS — M4802 Spinal stenosis, cervical region: Secondary | ICD-10-CM | POA: Diagnosis not present

## 2024-09-20 DIAGNOSIS — J449 Chronic obstructive pulmonary disease, unspecified: Secondary | ICD-10-CM | POA: Diagnosis not present

## 2024-09-20 DIAGNOSIS — I700 Atherosclerosis of aorta: Secondary | ICD-10-CM | POA: Diagnosis not present
